# Patient Record
Sex: Female | Born: 1940
Health system: Southern US, Community
[De-identification: ages and names within clinical notes are randomized; demographics above are authoritative.]

## PROBLEM LIST (undated history)

## (undated) DIAGNOSIS — G4730 Sleep apnea, unspecified: Secondary | ICD-10-CM

## (undated) DIAGNOSIS — R413 Other amnesia: Secondary | ICD-10-CM

## (undated) DIAGNOSIS — E669 Obesity, unspecified: Secondary | ICD-10-CM

## (undated) DIAGNOSIS — G629 Polyneuropathy, unspecified: Secondary | ICD-10-CM

## (undated) DIAGNOSIS — M797 Fibromyalgia: Secondary | ICD-10-CM

## (undated) DIAGNOSIS — M1990 Unspecified osteoarthritis, unspecified site: Secondary | ICD-10-CM

## (undated) DIAGNOSIS — I10 Essential (primary) hypertension: Secondary | ICD-10-CM

## (undated) DIAGNOSIS — K219 Gastro-esophageal reflux disease without esophagitis: Secondary | ICD-10-CM

## (undated) DIAGNOSIS — A609 Anogenital herpesviral infection, unspecified: Secondary | ICD-10-CM

## (undated) DIAGNOSIS — K589 Irritable bowel syndrome without diarrhea: Secondary | ICD-10-CM

## (undated) DIAGNOSIS — N3281 Overactive bladder: Secondary | ICD-10-CM

## (undated) DIAGNOSIS — G2581 Restless legs syndrome: Secondary | ICD-10-CM

## (undated) DIAGNOSIS — F419 Anxiety disorder, unspecified: Secondary | ICD-10-CM

## (undated) HISTORY — PX: MULTIPLE TOOTH EXTRACTIONS: SHX2053

## (undated) HISTORY — DX: Sleep apnea, unspecified: G47.30

## (undated) HISTORY — PX: ROTATOR CUFF REPAIR: SHX139

## (undated) HISTORY — DX: Anxiety disorder, unspecified: F41.9

## (undated) HISTORY — PX: OTHER SURGICAL HISTORY: SHX169

## (undated) HISTORY — PX: CATARACT EXTRACTION: SUR2

## (undated) HISTORY — DX: Gastro-esophageal reflux disease without esophagitis: K21.9

## (undated) HISTORY — PX: CHOLECYSTECTOMY: SHX55

## (undated) HISTORY — DX: Fibromyalgia: M79.7

## (undated) HISTORY — PX: TOTAL KNEE ARTHROPLASTY: SHX125

## (undated) HISTORY — DX: Essential (primary) hypertension: I10

## (undated) HISTORY — DX: Obesity, unspecified: E66.9

## (undated) HISTORY — DX: Overactive bladder: N32.81

## (undated) HISTORY — PX: APPENDECTOMY: SHX54

## (undated) HISTORY — DX: Unspecified osteoarthritis, unspecified site: M19.90

## (undated) HISTORY — DX: Other amnesia: R41.3

## (undated) HISTORY — DX: Anogenital herpesviral infection, unspecified: A60.9

## (undated) HISTORY — DX: Irritable bowel syndrome, unspecified: K58.9

## (undated) HISTORY — DX: Polyneuropathy, unspecified: G62.9

## (undated) HISTORY — DX: Restless legs syndrome: G25.81

---

## 1997-07-03 ENCOUNTER — Emergency Department (HOSPITAL_COMMUNITY): Admission: EM | Admit: 1997-07-03 | Discharge: 1997-07-03 | Payer: Self-pay | Admitting: Emergency Medicine

## 1997-08-04 ENCOUNTER — Ambulatory Visit (HOSPITAL_COMMUNITY): Admission: RE | Admit: 1997-08-04 | Discharge: 1997-08-04 | Payer: Self-pay | Admitting: Gastroenterology

## 1998-06-23 ENCOUNTER — Other Ambulatory Visit: Admission: RE | Admit: 1998-06-23 | Discharge: 1998-06-23 | Payer: Self-pay | Admitting: Gynecology

## 1999-07-12 ENCOUNTER — Other Ambulatory Visit: Admission: RE | Admit: 1999-07-12 | Discharge: 1999-07-12 | Payer: Self-pay | Admitting: Gynecology

## 1999-09-22 ENCOUNTER — Encounter: Payer: Self-pay | Admitting: Orthopaedic Surgery

## 1999-09-22 ENCOUNTER — Encounter: Admission: RE | Admit: 1999-09-22 | Discharge: 1999-09-22 | Payer: Self-pay | Admitting: Orthopaedic Surgery

## 2000-06-07 ENCOUNTER — Encounter: Admission: RE | Admit: 2000-06-07 | Discharge: 2000-06-07 | Payer: Self-pay | Admitting: Family Medicine

## 2000-06-07 ENCOUNTER — Encounter: Payer: Self-pay | Admitting: Family Medicine

## 2000-08-01 ENCOUNTER — Other Ambulatory Visit: Admission: RE | Admit: 2000-08-01 | Discharge: 2000-08-01 | Payer: Self-pay | Admitting: Gynecology

## 2001-05-14 ENCOUNTER — Encounter: Admission: RE | Admit: 2001-05-14 | Discharge: 2001-05-14 | Payer: Self-pay | Admitting: Family Medicine

## 2001-05-14 ENCOUNTER — Encounter: Payer: Self-pay | Admitting: Family Medicine

## 2001-05-23 ENCOUNTER — Encounter: Payer: Self-pay | Admitting: Orthopedic Surgery

## 2001-05-23 ENCOUNTER — Encounter: Admission: RE | Admit: 2001-05-23 | Discharge: 2001-05-23 | Payer: Self-pay | Admitting: Orthopedic Surgery

## 2001-09-03 ENCOUNTER — Other Ambulatory Visit: Admission: RE | Admit: 2001-09-03 | Discharge: 2001-09-03 | Payer: Self-pay | Admitting: Gynecology

## 2002-09-10 ENCOUNTER — Other Ambulatory Visit: Admission: RE | Admit: 2002-09-10 | Discharge: 2002-09-10 | Payer: Self-pay | Admitting: Gynecology

## 2002-10-02 ENCOUNTER — Encounter: Payer: Self-pay | Admitting: Orthopaedic Surgery

## 2002-10-02 ENCOUNTER — Encounter: Admission: RE | Admit: 2002-10-02 | Discharge: 2002-10-02 | Payer: Self-pay | Admitting: Orthopaedic Surgery

## 2002-12-12 ENCOUNTER — Encounter: Admission: RE | Admit: 2002-12-12 | Discharge: 2002-12-12 | Payer: Self-pay | Admitting: Orthopaedic Surgery

## 2002-12-12 ENCOUNTER — Encounter: Payer: Self-pay | Admitting: Radiology

## 2002-12-12 ENCOUNTER — Encounter: Payer: Self-pay | Admitting: Orthopaedic Surgery

## 2002-12-30 ENCOUNTER — Encounter: Admission: RE | Admit: 2002-12-30 | Discharge: 2002-12-30 | Payer: Self-pay | Admitting: Orthopaedic Surgery

## 2003-01-13 ENCOUNTER — Encounter: Admission: RE | Admit: 2003-01-13 | Discharge: 2003-01-13 | Payer: Self-pay | Admitting: Orthopaedic Surgery

## 2003-09-18 ENCOUNTER — Other Ambulatory Visit: Admission: RE | Admit: 2003-09-18 | Discharge: 2003-09-18 | Payer: Self-pay | Admitting: Gynecology

## 2007-03-21 ENCOUNTER — Encounter: Admission: RE | Admit: 2007-03-21 | Discharge: 2007-03-21 | Payer: Self-pay | Admitting: Rheumatology

## 2008-02-05 ENCOUNTER — Encounter: Admission: RE | Admit: 2008-02-05 | Discharge: 2008-02-18 | Payer: Self-pay | Admitting: Neurology

## 2008-10-13 ENCOUNTER — Encounter: Admission: RE | Admit: 2008-10-13 | Discharge: 2008-10-13 | Payer: Self-pay | Admitting: Family Medicine

## 2008-12-15 ENCOUNTER — Ambulatory Visit (HOSPITAL_COMMUNITY): Admission: RE | Admit: 2008-12-15 | Discharge: 2008-12-15 | Payer: Self-pay | Admitting: Rheumatology

## 2009-03-24 ENCOUNTER — Inpatient Hospital Stay (HOSPITAL_COMMUNITY): Admission: EM | Admit: 2009-03-24 | Discharge: 2009-03-26 | Payer: Self-pay | Admitting: Emergency Medicine

## 2009-11-17 ENCOUNTER — Inpatient Hospital Stay (HOSPITAL_COMMUNITY)
Admission: RE | Admit: 2009-11-17 | Discharge: 2009-11-20 | Payer: Self-pay | Source: Home / Self Care | Admitting: Orthopaedic Surgery

## 2009-12-16 ENCOUNTER — Encounter: Admission: RE | Admit: 2009-12-16 | Discharge: 2009-12-16 | Payer: Self-pay | Admitting: Family Medicine

## 2010-05-13 LAB — BASIC METABOLIC PANEL
BUN: 6 mg/dL (ref 6–23)
BUN: 6 mg/dL (ref 6–23)
BUN: 8 mg/dL (ref 6–23)
CO2: 28 mEq/L (ref 19–32)
CO2: 31 mEq/L (ref 19–32)
CO2: 31 mEq/L (ref 19–32)
Calcium: 8.5 mg/dL (ref 8.4–10.5)
Calcium: 8.6 mg/dL (ref 8.4–10.5)
Calcium: 9.1 mg/dL (ref 8.4–10.5)
Chloride: 102 mEq/L (ref 96–112)
Chloride: 102 mEq/L (ref 96–112)
Chloride: 103 mEq/L (ref 96–112)
Creatinine, Ser: 0.7 mg/dL (ref 0.4–1.2)
Creatinine, Ser: 0.77 mg/dL (ref 0.4–1.2)
Creatinine, Ser: 0.8 mg/dL (ref 0.4–1.2)
GFR calc Af Amer: >60 mL/min (ref >60)
GFR calc Af Amer: >60 mL/min (ref >60)
GFR calc Af Amer: >60 mL/min (ref >60)
GFR calc non Af Amer: >60 mL/min (ref >60)
GFR calc non Af Amer: >60 mL/min (ref >60)
GFR calc non Af Amer: >60 mL/min (ref >60)
Glucose, Bld: 105 mg/dL — ABNORMAL HIGH (ref 70–99)
Glucose, Bld: 127 mg/dL — ABNORMAL HIGH (ref 70–99)
Glucose, Bld: 144 mg/dL — ABNORMAL HIGH (ref 70–99)
Potassium: 3.6 mEq/L (ref 3.5–5.1)
Potassium: 3.8 mEq/L (ref 3.5–5.1)
Potassium: 4.3 mEq/L (ref 3.5–5.1)
Sodium: 136 mEq/L (ref 135–145)
Sodium: 140 mEq/L (ref 135–145)
Sodium: 143 mEq/L (ref 135–145)

## 2010-05-13 LAB — CBC
HCT: 31.9 % — ABNORMAL LOW (ref 36.0–46.0)
HCT: 32 % — ABNORMAL LOW (ref 36.0–46.0)
HCT: 32.2 % — ABNORMAL LOW (ref 36.0–46.0)
HCT: 38.1 % (ref 36.0–46.0)
Hemoglobin: 10.4 g/dL — ABNORMAL LOW (ref 12.0–15.0)
Hemoglobin: 10.7 g/dL — ABNORMAL LOW (ref 12.0–15.0)
Hemoglobin: 10.8 g/dL — ABNORMAL LOW (ref 12.0–15.0)
Hemoglobin: 12.6 g/dL (ref 12.0–15.0)
MCH: 29.6 pg (ref 26.0–34.0)
MCH: 30.1 pg (ref 26.0–34.0)
MCH: 30.1 pg (ref 26.0–34.0)
MCH: 30.1 pg (ref 26.0–34.0)
MCHC: 32.5 g/dL (ref 30.0–36.0)
MCHC: 33.1 g/dL (ref 30.0–36.0)
MCHC: 33.5 g/dL (ref 30.0–36.0)
MCHC: 33.5 g/dL (ref 30.0–36.0)
MCV: 88.1 fL (ref 78.0–100.0)
MCV: 89.7 fL (ref 78.0–100.0)
MCV: 90.9 fL (ref 78.0–100.0)
MCV: 92.8 fL (ref 78.0–100.0)
Platelets: 123 10*3/uL — ABNORMAL LOW (ref 150–400)
Platelets: 152 10*3/uL (ref 150–400)
Platelets: 168 10*3/uL (ref 150–400)
Platelets: 207 10*3/uL (ref 150–400)
RBC: 3.45 MIL/uL — ABNORMAL LOW (ref 3.87–5.11)
RBC: 3.59 MIL/uL — ABNORMAL LOW (ref 3.87–5.11)
RBC: 3.62 MIL/uL — ABNORMAL LOW (ref 3.87–5.11)
RBC: 4.19 MIL/uL (ref 3.87–5.11)
RDW: 13.5 % (ref 11.5–15.5)
RDW: 13.6 % (ref 11.5–15.5)
RDW: 13.9 % (ref 11.5–15.5)
RDW: 14 % (ref 11.5–15.5)
WBC: 7.6 10*3/uL (ref 4.0–10.5)
WBC: 8.1 10*3/uL (ref 4.0–10.5)
WBC: 8.5 10*3/uL (ref 4.0–10.5)
WBC: 8.9 10*3/uL (ref 4.0–10.5)

## 2010-05-13 LAB — DIFFERENTIAL
Basophils Absolute: 0.1 10*3/uL (ref 0.0–0.1)
Basophils Relative: 1 % (ref 0–1)
Eosinophils Absolute: 0.2 10*3/uL (ref 0.0–0.7)
Eosinophils Relative: 3 % (ref 0–5)
Lymphocytes Relative: 27 % (ref 12–46)
Lymphs Abs: 2.3 10*3/uL (ref 0.7–4.0)
Monocytes Absolute: 1.1 10*3/uL — ABNORMAL HIGH (ref 0.1–1.0)
Monocytes Relative: 12 % (ref 3–12)
Neutro Abs: 4.9 10*3/uL (ref 1.7–7.7)
Neutrophils Relative %: 57 % (ref 43–77)

## 2010-05-13 LAB — ABO/RH: ABO/RH(D): O POS

## 2010-05-13 LAB — TYPE AND SCREEN
ABO/RH(D): O POS
Antibody Screen: NEGATIVE

## 2010-05-13 LAB — COMPREHENSIVE METABOLIC PANEL
ALT: 50 U/L — ABNORMAL HIGH (ref 0–35)
AST: 64 U/L — ABNORMAL HIGH (ref 0–37)
Albumin: 4.1 g/dL (ref 3.5–5.2)
Alkaline Phosphatase: 115 U/L (ref 39–117)
BUN: 11 mg/dL (ref 6–23)
CO2: 30 mEq/L (ref 19–32)
Calcium: 9.6 mg/dL (ref 8.4–10.5)
Chloride: 103 mEq/L (ref 96–112)
Creatinine, Ser: 0.86 mg/dL (ref 0.4–1.2)
GFR calc Af Amer: >60 mL/min (ref >60)
GFR calc non Af Amer: >60 mL/min (ref >60)
Glucose, Bld: 79 mg/dL (ref 70–99)
Potassium: 4.4 mEq/L (ref 3.5–5.1)
Sodium: 139 mEq/L (ref 135–145)
Total Bilirubin: 0.7 mg/dL (ref 0.3–1.2)
Total Protein: 7.2 g/dL (ref 6.0–8.3)

## 2010-05-13 LAB — URINALYSIS, ROUTINE W REFLEX MICROSCOPIC
Bilirubin Urine: NEGATIVE
Glucose, UA: NEGATIVE mg/dL
Hgb urine dipstick: NEGATIVE
Ketones, ur: NEGATIVE mg/dL
Nitrite: NEGATIVE
Protein, ur: NEGATIVE mg/dL
Specific Gravity, Urine: 1.008 (ref 1.005–1.030)
Urobilinogen, UA: 0.2 mg/dL (ref 0.0–1.0)
pH: 6.5 (ref 5.0–8.0)

## 2010-05-13 LAB — URINE CULTURE
Colony Count: NO GROWTH
Culture  Setup Time: 201109151220
Culture: NO GROWTH

## 2010-05-13 LAB — PROTIME-INR
INR: 0.97 (ref 0.00–1.49)
INR: 1.1 (ref 0.00–1.49)
Prothrombin Time: 13.1 seconds (ref 11.6–15.2)
Prothrombin Time: 14.4 seconds (ref 11.6–15.2)

## 2010-05-13 LAB — SURGICAL PCR SCREEN
MRSA, PCR: NEGATIVE
Staphylococcus aureus: NEGATIVE

## 2010-05-13 LAB — APTT: aPTT: 28 seconds (ref 24–37)

## 2010-05-17 LAB — CBC
HCT: 33.6 % — ABNORMAL LOW (ref 36.0–46.0)
HCT: 34.8 % — ABNORMAL LOW (ref 36.0–46.0)
HCT: 39.8 % (ref 36.0–46.0)
Hemoglobin: 11.2 g/dL — ABNORMAL LOW (ref 12.0–15.0)
Hemoglobin: 11.9 g/dL — ABNORMAL LOW (ref 12.0–15.0)
Hemoglobin: 13.8 g/dL (ref 12.0–15.0)
MCHC: 33.2 g/dL (ref 30.0–36.0)
MCHC: 34.1 g/dL (ref 30.0–36.0)
MCHC: 34.6 g/dL (ref 30.0–36.0)
MCV: 91.2 fL (ref 78.0–100.0)
MCV: 92 fL (ref 78.0–100.0)
MCV: 92.8 fL (ref 78.0–100.0)
Platelets: 170 10*3/uL (ref 150–400)
Platelets: 204 10*3/uL (ref 150–400)
Platelets: 243 10*3/uL (ref 150–400)
RBC: 3.62 MIL/uL — ABNORMAL LOW (ref 3.87–5.11)
RBC: 3.78 MIL/uL — ABNORMAL LOW (ref 3.87–5.11)
RBC: 4.37 MIL/uL (ref 3.87–5.11)
RDW: 14.2 % (ref 11.5–15.5)
RDW: 14.2 % (ref 11.5–15.5)
RDW: 14.4 % (ref 11.5–15.5)
WBC: 6.2 10*3/uL (ref 4.0–10.5)
WBC: 7.1 10*3/uL (ref 4.0–10.5)
WBC: 9 10*3/uL (ref 4.0–10.5)

## 2010-05-17 LAB — URINALYSIS, ROUTINE W REFLEX MICROSCOPIC
Bilirubin Urine: NEGATIVE
Glucose, UA: NEGATIVE mg/dL
Ketones, ur: 15 mg/dL — AB
Leukocytes, UA: NEGATIVE
Nitrite: NEGATIVE
Protein, ur: >300 mg/dL — AB
Specific Gravity, Urine: 1.012 (ref 1.005–1.030)
Urobilinogen, UA: 1 mg/dL (ref 0.0–1.0)
pH: 6 (ref 5.0–8.0)

## 2010-05-17 LAB — DIFFERENTIAL
Basophils Absolute: 0.1 10*3/uL (ref 0.0–0.1)
Basophils Absolute: 0.1 10*3/uL (ref 0.0–0.1)
Basophils Relative: 1 % (ref 0–1)
Basophils Relative: 1 % (ref 0–1)
Eosinophils Absolute: 0.2 10*3/uL (ref 0.0–0.7)
Eosinophils Absolute: 0.3 10*3/uL (ref 0.0–0.7)
Eosinophils Relative: 2 % (ref 0–5)
Eosinophils Relative: 4 % (ref 0–5)
Lymphocytes Relative: 19 % (ref 12–46)
Lymphocytes Relative: 23 % (ref 12–46)
Lymphs Abs: 1.4 10*3/uL (ref 0.7–4.0)
Lymphs Abs: 1.7 10*3/uL (ref 0.7–4.0)
Monocytes Absolute: 0.8 10*3/uL (ref 0.1–1.0)
Monocytes Absolute: 0.9 10*3/uL (ref 0.1–1.0)
Monocytes Relative: 10 % (ref 3–12)
Monocytes Relative: 13 % — ABNORMAL HIGH (ref 3–12)
Neutro Abs: 3.7 10*3/uL (ref 1.7–7.7)
Neutro Abs: 6.1 10*3/uL (ref 1.7–7.7)
Neutrophils Relative %: 59 % (ref 43–77)
Neutrophils Relative %: 67 % (ref 43–77)

## 2010-05-17 LAB — STOOL CULTURE

## 2010-05-17 LAB — COMPREHENSIVE METABOLIC PANEL
ALT: 47 U/L — ABNORMAL HIGH (ref 0–35)
AST: 63 U/L — ABNORMAL HIGH (ref 0–37)
Albumin: 2.9 g/dL — ABNORMAL LOW (ref 3.5–5.2)
Alkaline Phosphatase: 91 U/L (ref 39–117)
BUN: 7 mg/dL (ref 6–23)
CO2: 25 mEq/L (ref 19–32)
Calcium: 8.5 mg/dL (ref 8.4–10.5)
Chloride: 112 mEq/L (ref 96–112)
Creatinine, Ser: 0.61 mg/dL (ref 0.4–1.2)
GFR calc Af Amer: >60 mL/min (ref >60)
GFR calc non Af Amer: >60 mL/min (ref >60)
Glucose, Bld: 99 mg/dL (ref 70–99)
Potassium: 3.9 mEq/L (ref 3.5–5.1)
Sodium: 141 mEq/L (ref 135–145)
Total Bilirubin: 0.5 mg/dL (ref 0.3–1.2)
Total Protein: 6 g/dL (ref 6.0–8.3)

## 2010-05-17 LAB — URINE MICROSCOPIC-ADD ON

## 2010-05-17 LAB — POCT I-STAT, CHEM 8
BUN: 11 mg/dL (ref 6–23)
Calcium, Ion: 1.09 mmol/L — ABNORMAL LOW (ref 1.12–1.32)
Chloride: 107 mEq/L (ref 96–112)
Creatinine, Ser: 0.6 mg/dL (ref 0.4–1.2)
Glucose, Bld: 100 mg/dL — ABNORMAL HIGH (ref 70–99)
HCT: 41 % (ref 36.0–46.0)
Hemoglobin: 13.9 g/dL (ref 12.0–15.0)
Potassium: 3.3 mEq/L — ABNORMAL LOW (ref 3.5–5.1)
Sodium: 139 mEq/L (ref 135–145)
TCO2: 23 mmol/L (ref 0–100)

## 2010-05-17 LAB — GIARDIA/CRYPTOSPORIDIUM SCREEN(EIA)
Cryptosporidium Screen (EIA): NEGATIVE
Giardia Screen - EIA: NEGATIVE

## 2010-05-17 LAB — CK TOTAL AND CKMB (NOT AT ARMC)
CK, MB: 9.7 ng/mL (ref 0.3–4.0)
Relative Index: 2.3 (ref 0.0–2.5)
Total CK: 427 U/L — ABNORMAL HIGH (ref 7–177)

## 2010-05-17 LAB — HEPATIC FUNCTION PANEL
ALT: 57 U/L — ABNORMAL HIGH (ref 0–35)
AST: 94 U/L — ABNORMAL HIGH (ref 0–37)
Albumin: 3.4 g/dL — ABNORMAL LOW (ref 3.5–5.2)
Alkaline Phosphatase: 109 U/L (ref 39–117)
Bilirubin, Direct: 0.2 mg/dL (ref 0.0–0.3)
Indirect Bilirubin: 0.5 mg/dL (ref 0.3–0.9)
Total Bilirubin: 0.7 mg/dL (ref 0.3–1.2)
Total Protein: 7.1 g/dL (ref 6.0–8.3)

## 2010-05-17 LAB — MAGNESIUM: Magnesium: 1.9 mg/dL (ref 1.5–2.5)

## 2010-05-17 LAB — URINE CULTURE: Colony Count: 25000

## 2010-05-17 LAB — LACTIC ACID, PLASMA: Lactic Acid, Venous: 1.5 mmol/L (ref 0.5–2.2)

## 2010-05-17 LAB — BASIC METABOLIC PANEL
BUN: 6 mg/dL (ref 6–23)
CO2: 25 mEq/L (ref 19–32)
Calcium: 8.3 mg/dL — ABNORMAL LOW (ref 8.4–10.5)
Chloride: 110 mEq/L (ref 96–112)
Creatinine, Ser: 0.58 mg/dL (ref 0.4–1.2)
GFR calc Af Amer: >60 mL/min (ref >60)
GFR calc non Af Amer: >60 mL/min (ref >60)
Glucose, Bld: 98 mg/dL (ref 70–99)
Potassium: 3.7 mEq/L (ref 3.5–5.1)
Sodium: 140 mEq/L (ref 135–145)

## 2010-05-17 LAB — CLOSTRIDIUM DIFFICILE EIA: C difficile Toxins A+B, EIA: NEGATIVE

## 2010-05-17 LAB — LIPASE, BLOOD: Lipase: 27 U/L (ref 11–59)

## 2010-05-17 LAB — HEMOCCULT GUIAC POC 1CARD (OFFICE): Fecal Occult Bld: NEGATIVE

## 2010-05-17 LAB — FECAL LACTOFERRIN, QUANT: Fecal Lactoferrin: NEGATIVE

## 2010-05-17 LAB — TROPONIN I: Troponin I: 0.01 ng/mL (ref 0.00–0.06)

## 2010-05-17 LAB — POCT CARDIAC MARKERS
CKMB, poc: 8.7 ng/mL (ref 1.0–8.0)
Myoglobin, poc: >500 ng/mL (ref 12–200)
Troponin i, poc: <0.05 ng/mL (ref 0.00–0.09)

## 2010-05-17 LAB — PHOSPHORUS: Phosphorus: 3.1 mg/dL (ref 2.3–4.6)

## 2011-06-28 ENCOUNTER — Other Ambulatory Visit: Payer: Self-pay | Admitting: Neurology

## 2011-06-28 DIAGNOSIS — M797 Fibromyalgia: Secondary | ICD-10-CM

## 2011-07-02 ENCOUNTER — Other Ambulatory Visit: Payer: Self-pay

## 2011-07-08 ENCOUNTER — Ambulatory Visit
Admission: RE | Admit: 2011-07-08 | Discharge: 2011-07-08 | Disposition: A | Discharge status: Home / Self Care | Payer: Medicare Other | Source: Ambulatory Visit | Attending: Neurology | Admitting: Neurology

## 2011-07-08 DIAGNOSIS — M797 Fibromyalgia: Secondary | ICD-10-CM

## 2011-12-22 ENCOUNTER — Other Ambulatory Visit (HOSPITAL_COMMUNITY)
Admission: RE | Admit: 2011-12-22 | Discharge: 2011-12-22 | Disposition: A | Discharge status: Home / Self Care | Payer: Medicare Other | Source: Ambulatory Visit | Attending: Obstetrics and Gynecology | Admitting: Obstetrics and Gynecology

## 2011-12-22 ENCOUNTER — Other Ambulatory Visit: Payer: Self-pay | Admitting: Obstetrics and Gynecology

## 2011-12-22 DIAGNOSIS — Z124 Encounter for screening for malignant neoplasm of cervix: Secondary | ICD-10-CM | POA: Insufficient documentation

## 2011-12-22 DIAGNOSIS — Z1151 Encounter for screening for human papillomavirus (HPV): Secondary | ICD-10-CM | POA: Insufficient documentation

## 2011-12-22 DIAGNOSIS — N644 Mastodynia: Secondary | ICD-10-CM

## 2011-12-22 DIAGNOSIS — R87810 Cervical high risk human papillomavirus (HPV) DNA test positive: Secondary | ICD-10-CM | POA: Insufficient documentation

## 2012-01-03 ENCOUNTER — Ambulatory Visit
Admission: RE | Admit: 2012-01-03 | Discharge: 2012-01-03 | Disposition: A | Discharge status: Home / Self Care | Payer: Medicare Other | Source: Ambulatory Visit | Attending: Obstetrics and Gynecology | Admitting: Obstetrics and Gynecology

## 2012-01-03 DIAGNOSIS — N644 Mastodynia: Secondary | ICD-10-CM

## 2012-08-01 ENCOUNTER — Encounter: Payer: Self-pay | Admitting: Nurse Practitioner

## 2012-08-02 ENCOUNTER — Encounter: Payer: Self-pay | Admitting: Nurse Practitioner

## 2012-08-02 ENCOUNTER — Ambulatory Visit (INDEPENDENT_AMBULATORY_CARE_PROVIDER_SITE_OTHER): Payer: Medicare Other | Admitting: Nurse Practitioner

## 2012-08-02 VITALS — BP 113/69 | HR 96 | Ht 65.5 in | Wt 153.0 lb

## 2012-08-02 DIAGNOSIS — R413 Other amnesia: Secondary | ICD-10-CM

## 2012-08-02 DIAGNOSIS — IMO0001 Reserved for inherently not codable concepts without codable children: Secondary | ICD-10-CM

## 2012-08-02 NOTE — Progress Notes (Signed)
HPI: Patient returns for followup after last visit with Dr. Terrace Arabia 06/21/2011. She has a history of fibromyalgia hypertension anxiety obstructive sleep apnea using CPAP and short-term memory trouble. She also has a history of bilateral feet paresthesias. MRI of the brain 07/11/2011 shows mild changes of chronic microvascular ischemia and generalized cerebral atrophy. Dementia labs  returned normal . The patient continues to drive and has gotten lost in unfamiliar surroundings. She continues to be independent with her activities of daily living. She plays golf. She has not had any falls, long-standing history of frequency incontinence and on oxybutynin. Her Mini-Mental Status exam is stable ROS:  Fatigue, depression anxiety, increased thirst, joint pains, mild memory loss  Physical Exam General: well developed, well nourished, seated, in no evident distress Head: head normocephalic and atraumatic. Oropharynx benign Neck: supple with no carotid  bruits Cardiovascular: regular rate and rhythm, no murmurs  Neurologic Exam Mental Status: Awake and fully alert. Oriented to place and time. Follows all commands. Speech and language normal. MMSE 30/30.   Cranial Nerves:  Pupils equal, briskly reactive to light. Extraocular movements full without nystagmus. Visual fields full to confrontation. Hearing intact and symmetric to finger snap. Facial sensation intact. Face, tongue, palate move normally and symmetrically. Neck flexion and extension normal.  Motor: Normal bulk and tone. Normal strength in all tested extremity muscles.No focal weakness Sensory.: intact to touch and pinprick and vibratory.  Coordination: Rapid alternating movements normal in all extremities. Finger-to-nose and heel-to-shin performed accurately bilaterally. Gait and Station: Arises from chair without difficulty. Stance is normal. Gait demonstrates normal stride length and balance . Able to heel, toe and mild difficulty with tandem walk.  Romberg negative  Reflexes: 2+ and symmetric except Achilles 1+. Toes downgoing.     ASSESSMENT: Mild cognitive loss but stable MMSE 30 out of 30. MRI of the brain 07/11/2011 with mild changes of chronic microvascular ischemia and generalized cerebral atrophy Labs for dementia returned normal     PLAN: Follow up yearly to evaluate memory status Fall risk=9, be careful with ambulation  Nilda Riggs, GNP-BC APRN

## 2012-08-02 NOTE — Patient Instructions (Addendum)
Pt to continue current meds. Fu yearly for MMSE score today 30/30

## 2013-01-15 ENCOUNTER — Encounter: Payer: Self-pay | Admitting: Cardiology

## 2013-01-17 ENCOUNTER — Telehealth: Payer: Self-pay | Admitting: Cardiology

## 2013-01-17 NOTE — Telephone Encounter (Signed)
Spoke to pt

## 2013-01-17 NOTE — Telephone Encounter (Signed)
New Problem:  Pt states she is returning Danielle's call. Please advise

## 2013-04-19 ENCOUNTER — Encounter: Payer: Self-pay | Admitting: General Surgery

## 2013-04-19 DIAGNOSIS — I10 Essential (primary) hypertension: Secondary | ICD-10-CM

## 2013-04-19 DIAGNOSIS — G4733 Obstructive sleep apnea (adult) (pediatric): Secondary | ICD-10-CM | POA: Insufficient documentation

## 2013-04-22 ENCOUNTER — Ambulatory Visit (INDEPENDENT_AMBULATORY_CARE_PROVIDER_SITE_OTHER): Payer: Medicare Other | Admitting: Cardiology

## 2013-04-22 ENCOUNTER — Encounter: Payer: Self-pay | Admitting: Cardiology

## 2013-04-22 VITALS — BP 124/76 | HR 72 | Ht 65.5 in | Wt 158.0 lb

## 2013-04-22 DIAGNOSIS — G4733 Obstructive sleep apnea (adult) (pediatric): Secondary | ICD-10-CM

## 2013-04-22 DIAGNOSIS — I10 Essential (primary) hypertension: Secondary | ICD-10-CM

## 2013-04-22 NOTE — Progress Notes (Signed)
[address] [place], [place]  [address] Phone: [phone] Fax:  [phone]  Date:  04/22/2013   ID:  Lynn Price, DOB 1940-09-02, MRN [id number]  PCP:  Thora Lance, MD  Sleep Medicine:  Armanda Magic, MD     History of Present Illness: Lynn Price is a 73 y.o. female with a history of OSA and HTN who presents today for followup.  She is doing well with her device.  She tolerates her CPAP machine but says that she cannot tolerate using it every night..  She tolerates her nasal pillow mask and feels the pressure is adequate.  She feels rested in the am and has no daytime sleepiness unless she has not used her device the night before.  She does not get any aerobic exercise.  Wt Readings from Last 3 Encounters:  04/22/13 158 lb (71.668 kg)  08/02/12 153 lb (69.4 kg)  06/21/11 160 lb (72.576 kg)     Past Medical History  Diagnosis Date  . High blood pressure   . Neuropathy   . Anxiety   . Fibromyalgia   . GERD (gastroesophageal reflux disease)   . IBS (irritable bowel syndrome)   . HSV (herpes simplex virus) anogenital infection   . Restless leg syndrome   . Sleep apnea   . Memory loss   . Osteoarthritis   . Overactive bladder     Current Outpatient Prescriptions  Medication Sig Dispense Refill  . diltiazem (CARTIA XT) 180 MG 24 hr capsule Take 180 mg by mouth daily.      Marland Kitchen estradiol (ESTRACE) 0.1 MG/GM vaginal cream Place 1 Applicatorful vaginally as needed.      . Folic Acid-Vit B6-Vit B12 (FABB) 2.2-25-1 MG TABS Take 1 tablet by mouth 2 (two) times daily.      Marland Kitchen gabapentin (NEURONTIN) 300 MG capsule Take 300 mg by mouth. 2 tab in am 3 tab in pm      . LORazepam (ATIVAN) 0.5 MG tablet 0.5 mg 2 (two) times daily. As needed for anxiety      . Omeprazole (HM OMEPRAZOLE) 20 MG TBEC Take 20 mg by mouth 2 (two) times daily.       Marland Kitchen oxybutynin (DITROPAN) 5 MG tablet Take 5 mg by mouth 2 (two) times daily.       Marland Kitchen triamcinolone (KENALOG) 0.025 % cream  Apply topically 2 (two) times daily.      Marland Kitchen venlafaxine XR (EFFEXOR XR) 150 MG 24 hr capsule Take 150 mg by mouth daily.       No current facility-administered medications for this visit.    Allergies:    Allergies  Allergen Reactions  . Erythromycin Rash  . Penicillins Rash  . Tetracyclines & Related Rash    Social History:  The patient  reports that she has never smoked. She has never used smokeless tobacco. She reports that she drinks alcohol. She reports that she does not use illicit drugs.   Family History:  The patient's family history includes Depression in her father and mother.   ROS:  Please see the history of present illness.      All other systems reviewed and negative.   PHYSICAL EXAM: VS:  BP 124/76  Pulse 72  Ht 5' 5.5" (1.664 m)  Wt 158 lb (71.668 kg)  BMI 25.88 kg/m2 Well nourished, well developed, in no acute distress HEENT: normal Neck: no JVD Cardiac:  normal S1, S2; RRR; no murmur Lungs:  clear to  auscultation bilaterally, no wheezing, rhonchi or rales Abd: soft, nontender, no hepatomegaly Ext: no edema Skin: warm and dry Neuro:  CNs 2-12 intact, no focal abnormalities noted       ASSESSMENT AND PLAN:  1. OSA on CPAP  - I will get a download from her DME 2. HTN - well controlled  Followup with me in 6 months  Signed, Armanda Magicraci Eureka Valdes, MD 04/22/2013 11:00 AM

## 2013-04-22 NOTE — Patient Instructions (Signed)
Your physician recommends that you continue on your current medications as directed. Please refer to the Current Medication list given to you today.  Bring your unit over to [facility] for a download  Your physician wants you to follow-up in: 6 months with Dr Mayford Knifeurner. You will receive a reminder letter in the mail two months in advance. If you don't receive a letter, please call our office to schedule the follow-up appointment.

## 2013-05-17 ENCOUNTER — Encounter: Payer: Self-pay | Admitting: Cardiology

## 2013-05-20 ENCOUNTER — Other Ambulatory Visit: Payer: Self-pay | Admitting: General Surgery

## 2013-05-20 DIAGNOSIS — G4733 Obstructive sleep apnea (adult) (pediatric): Secondary | ICD-10-CM

## 2013-07-17 ENCOUNTER — Other Ambulatory Visit (HOSPITAL_COMMUNITY)
Admission: RE | Admit: 2013-07-17 | Discharge: 2013-07-17 | Disposition: A | Discharge status: Home / Self Care | Payer: Medicare Other | Source: Ambulatory Visit | Attending: Obstetrics and Gynecology | Admitting: Obstetrics and Gynecology

## 2013-07-17 ENCOUNTER — Other Ambulatory Visit: Payer: Self-pay | Admitting: Obstetrics and Gynecology

## 2013-07-17 DIAGNOSIS — Z124 Encounter for screening for malignant neoplasm of cervix: Secondary | ICD-10-CM | POA: Insufficient documentation

## 2013-07-17 DIAGNOSIS — Z1151 Encounter for screening for human papillomavirus (HPV): Secondary | ICD-10-CM | POA: Insufficient documentation

## 2013-07-23 ENCOUNTER — Other Ambulatory Visit: Payer: Self-pay

## 2013-07-23 DIAGNOSIS — Z1231 Encounter for screening mammogram for malignant neoplasm of breast: Secondary | ICD-10-CM

## 2013-08-01 ENCOUNTER — Encounter: Payer: Self-pay | Admitting: Nurse Practitioner

## 2013-08-01 ENCOUNTER — Encounter (INDEPENDENT_AMBULATORY_CARE_PROVIDER_SITE_OTHER): Payer: Self-pay

## 2013-08-01 ENCOUNTER — Ambulatory Visit (INDEPENDENT_AMBULATORY_CARE_PROVIDER_SITE_OTHER): Payer: Medicare Other | Admitting: Nurse Practitioner

## 2013-08-01 ENCOUNTER — Ambulatory Visit: Payer: Medicare Other | Admitting: Nurse Practitioner

## 2013-08-01 VITALS — BP 131/81 | HR 79 | Ht 65.0 in | Wt 160.0 lb

## 2013-08-01 DIAGNOSIS — R413 Other amnesia: Secondary | ICD-10-CM

## 2013-08-01 DIAGNOSIS — IMO0001 Reserved for inherently not codable concepts without codable children: Secondary | ICD-10-CM

## 2013-08-01 NOTE — Progress Notes (Signed)
[facility]  [name] DOB: 1940/05/28   REASON FOR VISIT: for memory loss   HISTORY OF PRESENT ILLNESS: Lynn Price, 73 year old female returns for followup. She was last seen 08/02/2012. She has a history of fibromyalgia hypertension, anxiety obstructive sleep apnea using CPAP and short-term memory trouble. She also has a history of bilateral feet paresthesias. MRI of the brain 07/11/2011 shows mild changes of chronic microvascular ischemia and generalized cerebral atrophy. Dementia labs returned normal . The patient continues to drive and has gotten lost in unfamiliar surroundings. She continues to be independent with her activities of daily living. She plays golf intermittently otherwise she gets no regular exercise. She has not had any falls, long-standing history of frequency incontinence and on oxybutynin. Her Mini-Mental Status exam is stable. She returns for reevaluation  REVIEW OF SYSTEMS: Full 14 system review of systems performed and notable only for those listed, all others are neg:  Constitutional: fatigue Cardiovascular: N/A  Ear/Nose/Throat: N/A  Skin: N/A  Eyes: Allergies Respiratory: Cough Gastroitestinal: Constipation, urinary frequency Hematology/Lymphatic: N/A  Endocrine: N/A Musculoskeletal: Joint pain, muscle Allergy/Immunology: N/A  Neurological: Memory loss Psychiatric: Anxiety Sleep : Obstructive sleep apnea   ALLERGIES: Allergies  Allergen Reactions  . Erythromycin Rash  . Penicillins Rash  . Tetracyclines & Related Rash    HOME MEDICATIONS: Outpatient Prescriptions Prior to Visit  Medication Sig Dispense Refill  . diltiazem (CARTIA XT) 180 MG 24 hr capsule Take 180 mg by mouth daily.      Marland Kitchen estradiol (ESTRACE) 0.1 MG/GM vaginal cream Place 1 Applicatorful vaginally as needed.      . gabapentin (NEURONTIN) 300 MG capsule Take 300 mg by mouth. 2 tab in am 3 tab in pm      . LORazepam (ATIVAN) 0.5 MG tablet 0.5 mg  2 (two) times daily. As needed for anxiety      . Omeprazole (HM OMEPRAZOLE) 20 MG TBEC Take 20 mg by mouth 2 (two) times daily.       Marland Kitchen oxybutynin (DITROPAN) 5 MG tablet Take 5 mg by mouth 2 (two) times daily.       Marland Kitchen triamcinolone (KENALOG) 0.025 % cream Apply topically 2 (two) times daily.      Marland Kitchen venlafaxine XR (EFFEXOR XR) 150 MG 24 hr capsule Take 150 mg by mouth daily.      . Folic Acid-Vit B6-Vit B12 (FABB) 2.2-25-1 MG TABS Take 1 tablet by mouth 2 (two) times daily.       No facility-administered medications prior to visit.    PAST MEDICAL HISTORY: Past Medical History  Diagnosis Date  . High blood pressure   . Neuropathy   . Anxiety   . Fibromyalgia   . GERD (gastroesophageal reflux disease)   . IBS (irritable bowel syndrome)   . HSV (herpes simplex virus) anogenital infection   . Restless leg syndrome   . Sleep apnea   . Memory loss   . Osteoarthritis   . Overactive bladder     PAST SURGICAL HISTORY: Past Surgical History  Procedure Laterality Date  . Appendectomy    . Cesarean section    . Cholecystectomy    . Rotator cuff repair Left   . Cataract extraction Left   . Colonscopy      FAMILY HISTORY: Family History  Problem Relation Age of Onset  . Depression Mother   . Depression Father     SOCIAL HISTORY: History   Social History  . Marital Status: Married  Spouse Name: Lynn Price    Number of Children: 2  . Years of Education: college   Occupational History  .      retired   Social History Main Topics  . Smoking status: Never Smoker   . Smokeless tobacco: Never Used  . Alcohol Use: 0.0 oz/week     Comment: wine occas  . Drug Use: No  . Sexual Activity: Not on file   Other Topics Concern  . Not on file   Social History Narrative   Patient lives at home with her husband Lynn Aguas(Lynn Price) and she is retired. Patient has 2 years of college. Patient has 2 children.      PHYSICAL EXAM  Filed Vitals:   08/01/13 1039  BP: 131/81  Pulse: 79    Height: 5\' 5"  (1.651 m)  Weight: 160 lb (72.576 kg)   Body mass index is 26.63 kg/(m^2).  Generalized: Well developed, in no acute distress  Head: normocephalic and atraumatic,. Oropharynx benign  Neck: Supple, no carotid bruits  Cardiac: Regular rate rhythm, no murmur  Musculoskeletal: No deformity   Neurological examination   Mentation: Alert oriented to time, place, history taking. MMSE 30/30/ AFT 9. Follows all commands speech and language fluent  Cranial nerve II-XII: Pupils were equal round reactive to light extraocular movements were full, visual field were full on confrontational test. Facial sensation and strength were normal. hearing was intact to finger rubbing bilaterally. Uvula tongue midline. head turning and shoulder shrug were normal and symmetric.Tongue protrusion into cheek strength was normal. Motor: normal bulk and tone, full strength in the BUE, BLE,  No focal weakness Sensory: normal and symmetric to light touch, pinprick, and  vibration  Coordination: finger-nose-finger, heel-to-shin bilaterally, no dysmetria Reflexes: Brachioradialis 2/2, biceps 2/2, triceps 2/2, patellar 2/2, Achilles 1/1, plantar responses were flexor bilaterally. Gait and Station: Rising up from seated position without assistance, normal stance,  moderate stride, good arm swing, smooth turning, able to perform tiptoe, and heel walking without difficulty. Tandem gait is mildly unsteady. Romberg negative  DIAGNOSTIC DATA (LABS, IMAGING, TESTING) -ASSESSMENT AND PLAN  73 y.o. year old female  has a past medical history of  Neuropathy; Anxiety; Fibromyalgia;  IBS (irritable bowel syndrome); Restless leg syndrome; Sleep apnea; Memory loss; Osteoarthritis; and Overactive bladder. here to followup. Her mild cognitive loss is stable  Given patient education on fibromayagia and reviewed the importance of exercise Needs daily exercise for overall general health and well-being Followup yearly Next  visit with Dr. Carmelina NounYan Draysen Weygandt Carolyn Mercadez Heitman, [facility] NetworkGNP, [facility]BC, APRN  [facility] [address] [place], [place] [address] [phone]

## 2013-08-01 NOTE — Patient Instructions (Signed)
Memory score is stable Daily exercise for overall general health and well-being Followup yearly Next visit with Dr. Terrace Arabia

## 2013-08-05 ENCOUNTER — Ambulatory Visit
Admission: RE | Admit: 2013-08-05 | Discharge: 2013-08-05 | Disposition: A | Discharge status: Home / Self Care | Payer: Medicare Other | Source: Ambulatory Visit

## 2013-08-05 DIAGNOSIS — Z1231 Encounter for screening mammogram for malignant neoplasm of breast: Secondary | ICD-10-CM

## 2013-08-21 ENCOUNTER — Encounter: Payer: Self-pay | Admitting: *Deleted

## 2013-09-25 ENCOUNTER — Other Ambulatory Visit: Payer: Self-pay | Admitting: Cardiology

## 2013-12-28 ENCOUNTER — Other Ambulatory Visit: Payer: Self-pay | Admitting: Cardiology

## 2014-01-15 ENCOUNTER — Encounter: Payer: Self-pay | Admitting: Neurology

## 2014-01-21 ENCOUNTER — Encounter: Payer: Self-pay | Admitting: Neurology

## 2014-04-01 ENCOUNTER — Ambulatory Visit (INDEPENDENT_AMBULATORY_CARE_PROVIDER_SITE_OTHER): Payer: Medicare Other | Admitting: Cardiology

## 2014-04-01 ENCOUNTER — Encounter: Payer: Self-pay | Admitting: Cardiology

## 2014-04-01 VITALS — BP 110/70 | HR 72 | Ht 60.0 in | Wt 153.8 lb

## 2014-04-01 DIAGNOSIS — I10 Essential (primary) hypertension: Secondary | ICD-10-CM

## 2014-04-01 DIAGNOSIS — E669 Obesity, unspecified: Secondary | ICD-10-CM

## 2014-04-01 DIAGNOSIS — G4733 Obstructive sleep apnea (adult) (pediatric): Secondary | ICD-10-CM

## 2014-04-01 HISTORY — DX: Obesity, unspecified: E66.9

## 2014-04-01 NOTE — Progress Notes (Signed)
Cardiology Office Note   Date:  04/01/2014   ID:  Lynn Fosterlaine C Pho, DOB 01-May-1940, MRN [id number]  PCP:  Thora LanceEHINGER,ROBERT R, MD  Cardiologist:   Quintella ReichertURNER,Camry Robello R, MD   Chief Complaint  Patient presents with  . Sleep Apnea  . Hypertension  . Obesity      History of Present Illness: Lynn Price is a 74 y.o. female with a history of OSA and HTN who presents today for followup. She is not doing well with her device. She stopped using her CPAP device and does not want to use it.  She says it is hard to clean and irritates her at night.  She says that it is very old. She says that she would be willing to try another sleep study to see if she still has OSA and if she does she would like a new machine.  She says that she sleeps well at night but is sleepy during the day and frequently naps in the afternoon.  Her husband says that she does not snore like she used to.    Past Medical History  Diagnosis Date  . High blood pressure   . Neuropathy   . Anxiety   . Fibromyalgia   . GERD (gastroesophageal reflux disease)   . IBS (irritable bowel syndrome)   . HSV (herpes simplex virus) anogenital infection   . Restless leg syndrome   . Sleep apnea   . Memory loss   . Osteoarthritis   . Overactive bladder   . Obesity (BMI 30-39.9) 04/01/2014    Past Surgical History  Procedure Laterality Date  . Appendectomy    . Cesarean section    . Cholecystectomy    . Rotator cuff repair Left   . Cataract extraction Left   . Colonscopy       Current Outpatient Prescriptions  Medication Sig Dispense Refill  . clobetasol (TEMOVATE) 0.05 % external solution     . diltiazem (CARDIZEM CD) 180 MG 24 hr capsule Take 1 capsule by mouth  daily for blood pressure 90 capsule 0  . econazole nitrate 1 % cream     . estradiol (ESTRACE) 0.1 MG/GM vaginal cream Place 1 Applicatorful vaginally as needed.    . gabapentin (NEURONTIN) 300 MG capsule Take 300 mg by mouth. 2 tab in am 3 tab in pm    .  LORazepam (ATIVAN) 0.5 MG tablet 0.5 mg 2 (two) times daily. As needed for anxiety    . Omeprazole (HM OMEPRAZOLE) 20 MG TBEC Take 20 mg by mouth 2 (two) times daily.     Marland Kitchen. oxybutynin (DITROPAN) 5 MG tablet Take 5 mg by mouth 2 (two) times daily.     Marland Kitchen. triamcinolone (KENALOG) 0.025 % cream Apply topically 2 (two) times daily.    . valACYclovir (VALTREX) 500 MG tablet     . venlafaxine XR (EFFEXOR XR) 150 MG 24 hr capsule Take 150 mg by mouth daily.     No current facility-administered medications for this visit.    Allergies:   Erythromycin; Penicillins; and Tetracyclines & related    Social History:  The patient  reports that she has never smoked. She has never used smokeless tobacco. She reports that she drinks alcohol. She reports that she does not use illicit drugs.   Family History:  The patient's family history includes Depression in her father and mother.    ROS:  Please see the history of present illness.   Otherwise, review of systems are positive  for none.   All other systems are reviewed and negative.    PHYSICAL EXAM: VS:  BP 110/70 mmHg  Pulse 72  Ht 5' (1.524 m)  Wt 153 lb 12.8 oz (69.763 kg)  BMI 30.04 kg/m2  SpO2 97% , BMI Body mass index is 30.04 kg/(m^2). GEN: Well nourished, well developed, in no acute distress HEENT: normal Neck: no JVD, carotid bruits, or masses Cardiac: RRR; no murmurs, rubs, or gallops,no edema  Respiratory:  clear to auscultation bilaterally, normal work of breathing GI: soft, nontender, nondistended, + BS MS: no deformity or atrophy Skin: warm and dry, no rash Neuro:  Strength and sensation are intact Psych: euthymic mood, full affect   EKG:  EKG is not ordered today.    Recent Labs: No results found for requested labs within last 365 days.    Lipid Panel No results found for: CHOL, TRIG, HDL, CHOLHDL, VLDL, LDLCALC, LDLDIRECT    Wt Readings from Last 3 Encounters:  04/01/14 153 lb 12.8 oz (69.763 kg)  08/01/13 160 lb  (72.576 kg)  04/22/13 158 lb (71.668 kg)    ASSESSMENT AND PLAN:  1. OSA on CPAP but stopped using it about 5 months ago because she didn't like it and it was hard to clean.  She is agreeable to repeating the sleep study and if she has significant OSA agrees to a new CPAP machine.  If OSA is only mild then we can pursue an oral device.   2. HTN - well controlled.  Continue Diltiazem 3. Obesity - she does not get much exercise.  I have encouraged her to try to get into a walking routine  Current medicines are reviewed at length with the patient today.  The patient does not have concerns regarding medicines.  The following changes have been made:  no change   Disposition:   FU with me after sleep study  Harlon Flor, MD  04/01/2014 11:36 AM    [facility] [address] [place], [place], [place]  [address] Phone: [phone]; Fax: [phone]

## 2014-04-01 NOTE — Patient Instructions (Signed)
Your physician recommends that you continue on your current medications as directed. Please refer to the Current Medication list given to you today.    Your physician has recommended that you have a sleep study. This test records several body functions during sleep, including: brain activity, eye movement, oxygen and carbon dioxide blood levels, heart rate and rhythm, breathing rate and rhythm, the flow of air through your mouth and nose, snoring, body muscle movements, and chest and belly movement.    FOLLOW AS NEEDED WITH DR TURNER AFTER SLEEP STUDY RESULTS

## 2014-04-07 ENCOUNTER — Other Ambulatory Visit: Payer: Self-pay | Admitting: Cardiology

## 2014-06-09 ENCOUNTER — Encounter (HOSPITAL_BASED_OUTPATIENT_CLINIC_OR_DEPARTMENT_OTHER): Payer: Medicare Other

## 2014-06-10 ENCOUNTER — Ambulatory Visit (HOSPITAL_BASED_OUTPATIENT_CLINIC_OR_DEPARTMENT_OTHER): Payer: Medicare Other | Attending: Cardiology

## 2014-06-10 VITALS — Ht 60.0 in | Wt 153.0 lb

## 2014-06-10 DIAGNOSIS — R0683 Snoring: Secondary | ICD-10-CM | POA: Diagnosis not present

## 2014-06-10 DIAGNOSIS — G4733 Obstructive sleep apnea (adult) (pediatric): Secondary | ICD-10-CM

## 2014-06-12 ENCOUNTER — Telehealth: Payer: Self-pay | Admitting: Cardiology

## 2014-06-12 NOTE — Addendum Note (Signed)
Addended by: Quintella ReichertURNER, Nas Wafer R on: 06/12/2014 09:17 PM   Modules accepted: Orders

## 2014-06-12 NOTE — Telephone Encounter (Signed)
Pt had successful PAP titration. Please setup appointment in 10 weeks. Please let [facility] know that order for PAP is in EPIC.   

## 2014-06-12 NOTE — Sleep Study (Addendum)
   NAME: Lynn Price DATE OF BIRTH:  [birth year] MEDICAL RECORLowanda FosterD NUMBER [id number]  LOCATION: [facility]  PHYSICIAN: Clever Geraldo R  DATE OF STUDY: 06/10/2014  SLEEP STUDY TYPE: Split night Sleep study with CPAP titration               REFERRING PHYSICIAN: Quintella Reicherturner, Hira Trent R, MD  INDICATION FOR STUDY: Obstructive sleep apnea  EPWORTH SLEEPINESS SCORE: 16 HEIGHT: 5' (152.4 cm)  WEIGHT: 153 lb (69.4 kg)    Body mass index is 29.88 kg/(m^2).  NECK SIZE: 14 in.  MEDICATIONS: Reviewed in the chart  SLEEP ARCHITECTURE: During the diagnostic portion of the test, the patient slept for a total of 288 minutes out of a total sleep period of 330 minutes.  There was no REM sleep and only 0.5 minutes of slow wave sleep.  The onset to sleep latency was prolonged at 23 minutes and the sleep efficiency was slightly reduced at 81%.    RESPIRATORY DATA: The patient was started on CPAP at 4cm H2O and titrated for respiratory events and snoring to 5cm H2O.  The patient was able to sleep for a prolonged period of time at 5cm H2O without further respiratory events but was unable to achieve REM supine sleep.  The AHI was 0.8 events per hour.  There was minimal snoring noted.    OXYGEN DATA: The average oxygen saturation was 90% and the minimum oxygen saturation was 83%.  The time spent with oxygen saturations below 88% was 1.8 minutes.  CARDIAC DATA: The patient maintained NSR throughout the study. The average heart rate was 79 bpm and the minimum heart rate was 32 bpm.    MOVEMENT/PARASOMNIA: There were an increased frequency of periodic limb movement disorders with a PLMS index of 28 movements per hour.  There were no REM sleep behavior disorders.  IMPRESSION/ RECOMMENDATION:   1.  Successful CPAP titration to 5cm H2O although the patient did not reach REM supine sleep at optimum pressure. 2.  Minimal snoring was noted. 3.  Reduced sleep efficiency with increased frequency of  awakenings. 4.  Abnormal sleep architecture with no REM sleep and very little slow wave sleep. 5.  No significant oxygen desaturations below 88% for a prolonged period of time. 6.  The patient should be counseled in good sleep hygiene.   7.  The patient should be counseled to avoid sleeping in the supine position. 8.  Recommend ResMed CPAP with heated humidifier at 5cm H2O, small/medium Respironics Wisp Nasal cushion mask.  Signed:   Quintella ReichertURNER,Kyler Lerette R Diplomate, American Board of Sleep Medicine  ELECTRONICALLY SIGNED ON:  06/12/2014, 9:05 PM [facility] PH: [phone]   FX: [phone] ACCREDITED BY THE AMERICAN ACADEMY OF SLEEP MEDICINE

## 2014-08-04 ENCOUNTER — Encounter: Payer: Self-pay | Admitting: Neurology

## 2014-08-04 ENCOUNTER — Ambulatory Visit (INDEPENDENT_AMBULATORY_CARE_PROVIDER_SITE_OTHER): Payer: Medicare Other | Admitting: Neurology

## 2014-08-04 VITALS — BP 115/65 | HR 97 | Ht 60.0 in | Wt 153.0 lb

## 2014-08-04 DIAGNOSIS — R413 Other amnesia: Secondary | ICD-10-CM

## 2014-08-04 DIAGNOSIS — R202 Paresthesia of skin: Secondary | ICD-10-CM

## 2014-08-04 DIAGNOSIS — G4733 Obstructive sleep apnea (adult) (pediatric): Secondary | ICD-10-CM | POA: Diagnosis not present

## 2014-08-04 MED ORDER — DONEPEZIL HCL 10 MG PO TABS: 10.0000 mg | ORAL_TABLET | Freq: Every day | ORAL | Status: DC

## 2014-08-04 NOTE — Progress Notes (Signed)
Chief Complaint  Patient presents with  . Memory Loss    MMSE 28/30 - 6 animals. She is here with her husband today.  They both feel her memory has worsened over the last year.     [facility]  [name] DOB: 11-10-1940   HISTORY OF PRESENT ILLNESS: Lynn Price, 74 year old female was referred by her primary care physician Dr. Blair Heysobert Price for evaluation of memory trouble. She was last seen in June 2015 by Lynn Price for memory loss  She has a history of fibromyalgia hypertension, anxiety, obstructive sleep apnea using CPAP and short-term memory trouble. She also has a history of bilateral feet paresthesias.  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     MRI of the brain 07/11/2011 shows mild changes of chronic microvascular ischemia and generalized cerebral atrophy.    The patient continues to drive and has gotten lost in unfamiliar surroundings. She continues to be independent with her activities of daily living. She plays golf intermittently otherwise she gets no regular exercise. She has not had any falls, long-standing history of frequency incontinence and on oxybutynin. Her Mini-Mental Status exam is stable. She returns for reevaluation  UPDATE June 6th 2016. She is with her husband at today's clinical visit, she continue has mild slow worsening memory trouble, she continued to drive short distances in a familiar route, she visit her friends, go to church regularly, able to clean house, independent at daily activity,  She also complains of bilateral feet numbness tingling, slow worsening, she is taking gabapentin 300 mg 2 tablets in the morning 3 tablets every night, does make her feel tired and sleepy,  She has not used her CPAP machine for a while, tends to go to bed late, take long nap in the afternoon,  She also complains of being fatigued, lack of energy   REVIEW OF SYSTEMS: Full 14 system  review of systems performed and notable only for those listed, all others are neg: Fatigue, blurred vision, cough, trouble swallowing, shortness of breath, cold intolerance, excessive thirst, swollen abdomen, abdominal pain, black stool, constipation, rectal pain, restless legs, apnea, daytime sleepiness, snoring, difficulty urinating, incontinence of bladder, decreased urination, joints pain, back pain, achy muscles, muscle cramps, neck pain, bruise easily, memory loss, weakness, anxiety    ALLERGIES: Allergies  Allergen Reactions  . Erythromycin Rash  . Penicillins Rash  . Tetracyclines & Related Rash    HOME MEDICATIONS: Outpatient Prescriptions Prior to Visit  Medication Sig Dispense Refill  . clobetasol (TEMOVATE) 0.05 % external solution     . diltiazem (CARDIZEM CD) 180 MG 24 hr capsule Take 1 capsule by mouth  daily for blood pressure 90 capsule 3  . econazole nitrate 1 % cream     . estradiol (ESTRACE) 0.1 MG/GM vaginal cream Place 1 Applicatorful vaginally as needed.    . gabapentin (NEURONTIN) 300 MG capsule Take 300 mg by mouth. 2 tab in am 3 tab in pm    . LORazepam (ATIVAN) 0.5 MG tablet 0.5 mg 2 (two) times daily. As needed for anxiety    . Omeprazole (HM OMEPRAZOLE) 20 MG TBEC Take 20 mg by mouth 2 (two) times daily.     Marland Kitchen. triamcinolone (KENALOG) 0.025 % cream Apply topically 2 (two) times daily.    . valACYclovir (VALTREX) 500 MG tablet     . venlafaxine XR (EFFEXOR XR) 150 MG 24 hr capsule Take 150 mg by mouth daily.    .Marland Kitchen  oxybutynin (DITROPAN) 5 MG tablet Take 5 mg by mouth 2 (two) times daily.       PAST MEDICAL HISTORY: Past Medical History  Diagnosis Date  . High blood pressure   . Neuropathy   . Anxiety   . Fibromyalgia   . GERD (gastroesophageal reflux disease)   . IBS (irritable bowel syndrome)   . HSV (herpes simplex virus) anogenital infection   . Restless leg syndrome   . Sleep apnea   . Memory loss   . Osteoarthritis   . Overactive bladder   .  Obesity (BMI 30-39.9) 04/01/2014    PAST SURGICAL HISTORY: Past Surgical History  Procedure Laterality Date  . Appendectomy    . Cesarean section    . Cholecystectomy    . Rotator cuff repair Left   . Cataract extraction Left   . Colonscopy      FAMILY HISTORY: Family History  Problem Relation Age of Onset  . Depression Mother   . Depression Father     SOCIAL HISTORY: History   Social History  . Marital Status: Married    Spouse Name: Dimas Aguas  . Number of Children: 2  . Years of Education: college   Occupational History    retired   Social History Main Topics  . Smoking status: Never Smoker   . Smokeless tobacco: Never Used  . Alcohol Use: 0.0 oz/week     Comment: wine occas  . Drug Use: No  . Sexual Activity: Not on file   Other Topics Concern  . Not on file   Social History Narrative   Patient lives at home with her husband Dimas Aguas) and she is retired. Patient has 2 years of college. Patient has 2 children.      PHYSICAL EXAM  Filed Vitals:   08/04/14 1135  BP: 115/65  Pulse: 97  Height: 5' (1.524 m)  Weight: 153 lb (69.4 kg)   Body mass index is 29.88 kg/(m^2).  PHYSICAL EXAMNIATION:  Gen: NAD, conversant, well nourised, obese, well groomed                     Cardiovascular: Regular rate rhythm, no peripheral edema, warm, nontender. Eyes: Conjunctivae clear without exudates or hemorrhage Neck: Supple, no carotid bruise. Pulmonary: Clear to auscultation bilaterally   NEUROLOGICAL EXAM:  MENTAL STATUS: Speech /cognition: Mini-Mental Status Examination is 28 out of 30, she is not oriented to date, missed one out of 3 recalls animal naming was 6  CRANIAL NERVES: CN II: Visual fields are full to confrontation. Fundoscopic exam is normal with sharp discs and no vascular changes. Venous pulsations are present bilaterally. Pupils are 4 mm and briskly reactive to light. Visual acuity is 20/20 bilaterally. CN III, IV, VI: extraocular movement are  normal. No ptosis. CN V: Facial sensation is intact to pinprick in all 3 divisions bilaterally. Corneal responses are intact.  CN VII: Face is symmetric with normal eye closure and smile. CN VIII: Hearing is normal to rubbing fingers CN IX, X: Palate elevates symmetrically. Phonation is normal. CN XI: Head turning and shoulder shrug are intact CN XII: Tongue is midline with normal movements and no atrophy.  MOTOR: There is no pronator drift of out-stretched arms. Muscle bulk and tone are normal. Muscle strength is normal.  REFLEXES: Reflexes are 2+ and symmetric at the biceps, triceps, knees, and ankles. Plantar responses are flexor.  SENSORY: Light touch, pinprick, position sense, and vibration sense are intact in fingers and toes.  COORDINATION:  Rapid alternating movements and fine finger movements are intact. There is no dysmetria on finger-to-nose and heel-knee-shin. There are no abnormal or extraneous movements.   GAIT/STANCE: Posture is normal. Gait is steady with normal steps, base, arm swing, and turning. Heel and toe walking are normal. Tandem gait is normal.  Romberg is absent.   DIAGNOSTIC DATA (LABS, IMAGING, TESTING) -ASSESSMENT AND PLAN  74 y.o. year old female   1, mild cognitive impairment, slow worsening over time, will start donepezil 10 mg daily, 2. Bilateral feet paresthesia, previous electrodiagnostic study was normal, most suggestive of small fiber neuropathy. Continue gabapentin 3, obstructive sleep apnea, advised patient complying with her CPAP machine, 4. Return to clinic in 6 months     Levert Feinstein, M.D. Ph.D.  [facility] [address] [place], [place] [address] Phone: [phone] Fax:      [phone]

## 2014-08-08 ENCOUNTER — Telehealth: Payer: Self-pay | Admitting: Cardiology

## 2014-08-08 NOTE — Telephone Encounter (Signed)
Spoke to patient to let her know the results. Orders were already in, so I have sent [facility] a message.

## 2014-08-08 NOTE — Telephone Encounter (Signed)
Left Message with husband to have his wife call me back

## 2014-08-08 NOTE — Telephone Encounter (Signed)
New problem ° ° ° °Pt want to know results of her sleep test. °

## 2014-08-11 ENCOUNTER — Telehealth: Payer: Self-pay | Admitting: Neurology

## 2014-08-11 NOTE — Telephone Encounter (Signed)
Patient called stating she was having side effects from  donepezil (ARICEPT) 10 MG tablet . She is having some nausea, vomiting, tired, headache, dizzy. She started this med on 08/04/14 and the symptoms started on the 08/05/14 and increasing daily. Please call and advise. Patient can be reached at [phone].

## 2014-08-11 NOTE — Telephone Encounter (Signed)
Spoke to [place] - she is agreeable to try to 0.5 tab for 2-3 weeks then increase to 1 tab daily.

## 2014-08-11 NOTE — Telephone Encounter (Signed)
Please call pt, to decrease donepezil to 10mg  1/2 tab everyday for 2-3 weeks, then titraying to full tab every day, make sure she take medication after meal.

## 2014-08-20 ENCOUNTER — Other Ambulatory Visit: Payer: Self-pay | Admitting: Obstetrics and Gynecology

## 2014-08-20 ENCOUNTER — Other Ambulatory Visit (HOSPITAL_COMMUNITY)
Admission: RE | Admit: 2014-08-20 | Discharge: 2014-08-20 | Disposition: A | Discharge status: Home / Self Care | Payer: Medicare Other | Source: Ambulatory Visit | Attending: Obstetrics and Gynecology | Admitting: Obstetrics and Gynecology

## 2014-08-20 DIAGNOSIS — Z124 Encounter for screening for malignant neoplasm of cervix: Secondary | ICD-10-CM | POA: Insufficient documentation

## 2014-08-20 DIAGNOSIS — Z1151 Encounter for screening for human papillomavirus (HPV): Secondary | ICD-10-CM | POA: Diagnosis present

## 2014-08-22 LAB — CYTOLOGY - PAP

## 2014-08-26 ENCOUNTER — Other Ambulatory Visit: Payer: Self-pay

## 2014-08-26 DIAGNOSIS — Z1231 Encounter for screening mammogram for malignant neoplasm of breast: Secondary | ICD-10-CM

## 2014-09-04 ENCOUNTER — Telehealth: Payer: Self-pay | Admitting: Cardiology

## 2014-09-04 NOTE — Telephone Encounter (Signed)
New message      [facility]

## 2014-09-04 NOTE — Telephone Encounter (Addendum)
[facility] let [facility] know that the reason they are having an issue with insurance is because the sleep lab did not do what was ordered.  We ordered a Split Night Study, but a titration only was performed. Therefore, her insurance is not willing to pay for a new machine based only on a titration - they need the full diagnostic report.   I am touching base with the patient to let her know the issue, then I will talk to Dr. Mayford Knifeurner so that she is aware of what is going on.

## 2014-09-04 NOTE — Telephone Encounter (Signed)
Spoke with pt. She stated that she has not been able to get her CPAP machine yet because they told her there was not a titration done.   I have [facility] and let him know that she has already had a titration and the order is in Epic.   He apologized for the delay/misunderstanding and said that he would take care of her.   I called pt back to let her know that they would be contacting her.  She appreciated my assistance.

## 2014-09-09 NOTE — Telephone Encounter (Signed)
Upon further investigation, a full split night study was conducted.    Reports are being updated to reflect such.  I have a call into [place] [facility] as well as to the patient to let them know another study will not need to be done.  As soon as I have the updated report, I will let [facility] know so that they can process the information correctly for the patient to get her CPAP Machine.

## 2014-09-16 ENCOUNTER — Telehealth: Payer: Self-pay | Admitting: Cardiology

## 2014-09-16 NOTE — Telephone Encounter (Signed)
New message     Pt needs a status update on CPAP machine. Pt had sleep test on April 12th   Please call to discuss

## 2014-09-16 NOTE — Telephone Encounter (Signed)
Let patient know that we are working on getting things straight with her insurance.  She is aware that she will hear from either myself or [facility] on her new machine.

## 2014-09-22 ENCOUNTER — Encounter: Payer: Self-pay | Admitting: Cardiology

## 2014-09-22 ENCOUNTER — Telehealth: Payer: Self-pay | Admitting: Cardiology

## 2014-09-22 NOTE — Telephone Encounter (Signed)
Reviewed sleep study results.  The patient was set up for a split night study because she needed a new machine.  The tech only did a CPAP titration and not the full split night study. She will need to have the study repeated as a split night PSG and the sleep lab should not charge the patient since this was a mistake by the tech

## 2014-09-22 NOTE — Telephone Encounter (Signed)
I have a left a message for Ginger Littie Deeds, manager for the sleep lab. She is out of the office today but will be back tomorrow.

## 2014-09-22 NOTE — Telephone Encounter (Signed)
This encounter was created in error - please disregard.

## 2014-09-26 ENCOUNTER — Ambulatory Visit
Admission: RE | Admit: 2014-09-26 | Discharge: 2014-09-26 | Disposition: A | Discharge status: Home / Self Care | Payer: Medicare Other | Source: Ambulatory Visit

## 2014-09-26 DIAGNOSIS — Z1231 Encounter for screening mammogram for malignant neoplasm of breast: Secondary | ICD-10-CM

## 2014-09-29 NOTE — Telephone Encounter (Signed)
I have left another message for Ginger.

## 2014-09-30 ENCOUNTER — Other Ambulatory Visit: Payer: Self-pay | Admitting: *Deleted

## 2014-09-30 DIAGNOSIS — G4733 Obstructive sleep apnea (adult) (pediatric): Secondary | ICD-10-CM

## 2014-09-30 NOTE — Telephone Encounter (Signed)
Since I have left two messages for Ginger to call, I have sent her an email asking that she call me.

## 2014-09-30 NOTE — Telephone Encounter (Signed)
Per Ginger:  "Problem found! It turns out after much research that the lights out tag is just in the wrong place which makes it appear that is wasn't a proper split night study. However, it is and she did meet split night criteria. Now all we have to do is go in and change the tag to make it correct and then reprint the report. The problem is that this study was done on our old system. We are trying to hook back up computers from the old system now so we can access this study on [employer] to correct the issue. The only concern is that we may have problems doing this now due to the old system.  We are working on it and I will get back to you ASAP to let you know if we were able to fix it or not."    I am waiting to hear back to make sure that we have the correct paperwork so that her insurance will cover her machine.

## 2014-10-06 NOTE — Progress Notes (Signed)
   NAME: Lynn Price DATE OF BIRTH:  1940-05-15 MEDICAL RECORD NUMBER [id number]  LOCATION: [facility]  PHYSICIAN: Even Budlong R  DATE OF STUDY: 06/10/2014  SLEEP STUDY TYPE: Split night Nocturnal Polysomnogram with CPAP titration               REFERRING PHYSICIAN: Quintella Reichert, MD  INDICATION FOR STUDY: OSA  EPWORTH SLEEPINESS SCORE: 16 HEIGHT: 5' (152.4 cm)  WEIGHT: 153 lb (69.4 kg)    Body mass index is 29.88 kg/(m^2).  NECK SIZE: 14 in.  MEDICATIONS: Reviewed in the chart  SLEEP ARCHITECTURE: During the diagnostic portion of the study, the patient slept for a total of 136 minutes out of a total sleep period of 157 minutes.  There was no REM sleep and 0.5 minutes of slow wave sleep.  The onset to sleep latency was prolonged at 70 minutes.  The sleep efficiency was reduced at 60%.  During the CPAP titration, the patient slept for a total of 152 minutes out of a total sleep period time of 170 minutes.  There was no REM or slow wave sleep.  The onset to sleep latency was 3 minutes.  The sleep efficiency was normal at 88%.    RESPIRATORY DATA: During the diagnostic portion of the test the patient had a total of 8 apneic events, of which, 7 were obstructive apneas and  1 was central and there were  25  hypopneas.  The total AHI was 14.6 consistent with mild to moderate OSA.  Most events occurred in nonsupine NREM sleep.  The patient was started on CPAP at 5cm H2O.  The patient did well on 5cm with an AHI of 0.8/hr but did not reach REM supine sleep.    OXYGEN DATA: During the diagnostic portion of the study, the oxygen saturation dropped as low as 83%.  The average oxygen saturation was 90%.  During the CPAP titration, the lowest oxygen saturation was 87% and the average was 91%.  The time spent with oxygen saturations < 88% during the CPAP titration was 0.1 minutes.    CARDIAC DATA: The patient maintained NSR throughout the study.  MOVEMENT/PARASOMNIA: There  were an increased number of periodic limb movements with a PLMS index of 60 per hour.  There were no REM sleep behavior disorders noted.  IMPRESSION/ RECOMMENDATION:   1.   Mild to moderate Obstructive sleep apnea/hypopnea syndrome with an AHI of 14.6/hr.  Most events occurred in the nonsupine position during NREM sleep. 2.  Oxygen desaturations associated with respiratory events as low as 83%.   3.  Abnormal sleep architecture with no REM sleep or slow wave sleep. 4.  Reduced sleep efficiency with increased frequency of arousals from respiratory events. 5.  Increased periodic limb movements with a PLMS index of 60/hr.   6.  Successful CPAP titration to 5cm H2O with heated humidifier and mask of choice. 7. The patient should be counseled in good sleep hygiene.  Signed: Armanda Magic, MD ABSM Diplomat, American Board of Sleep Medicine 06/10/2014  Quintella Reichert Diplomate, American Board of Sleep Medicine  ELECTRONICALLY SIGNED ON:  10/06/2014, 11:09 AM [facility] PH: [phone]   FX: [phone] ACCREDITED BY THE AMERICAN ACADEMY OF SLEEP MEDICINE

## 2014-11-26 ENCOUNTER — Ambulatory Visit: Payer: Medicare Other | Admitting: Cardiology

## 2014-12-04 ENCOUNTER — Encounter: Payer: Self-pay | Admitting: Cardiology

## 2014-12-09 ENCOUNTER — Telehealth: Payer: Self-pay | Admitting: *Deleted

## 2014-12-09 DIAGNOSIS — G4733 Obstructive sleep apnea (adult) (pediatric): Secondary | ICD-10-CM

## 2014-12-09 NOTE — Telephone Encounter (Signed)
-----   Message from Quintella Reichert, MD sent at 12/07/2014 11:18 PM EDT ----- AHI too high - please order 2 week autotitration from 4 to 20cm H2O

## 2014-12-09 NOTE — Telephone Encounter (Signed)
Patient is aware that we will be doing this.  [facility] Notified.

## 2015-01-08 ENCOUNTER — Ambulatory Visit (INDEPENDENT_AMBULATORY_CARE_PROVIDER_SITE_OTHER): Payer: Medicare Other | Admitting: Cardiology

## 2015-01-08 ENCOUNTER — Encounter: Payer: Self-pay | Admitting: Cardiology

## 2015-01-08 VITALS — BP 110/60 | HR 82 | Ht 65.0 in | Wt 143.0 lb

## 2015-01-08 DIAGNOSIS — G4733 Obstructive sleep apnea (adult) (pediatric): Secondary | ICD-10-CM

## 2015-01-08 DIAGNOSIS — I10 Essential (primary) hypertension: Secondary | ICD-10-CM

## 2015-01-08 DIAGNOSIS — E669 Obesity, unspecified: Secondary | ICD-10-CM

## 2015-01-08 NOTE — Patient Instructions (Signed)

## 2015-01-08 NOTE — Progress Notes (Signed)
Cardiology Office Note   Date:  01/08/2015   ID:  Lynn Fosterlaine C Teuscher, DOB 1940-12-04, MRN [id number]  PCP:  Thora LanceEHINGER,ROBERT R, MD    Chief Complaint  Patient presents with  . Sleep Apnea      History of Present Illness: Lynn Price is a 74 y.o. female with a history of OSA and HTN who presents today for followup. When I last saw her she was not doing well with her device. She stopped using her CPAP device and did not want to use it. She said it was hard to clean and irritated her at night. She underwent repeat sleep study which showed mild to moderate OSA with an AHI of 14/hr and had successful CPAP titration to 5cm H2O.  She is doing well with her new CPAP device.  She tolerates her nasal pillow mask and feels the pressure is adequate.  She feels rested in the am and does not take a nap during the day.   Past Medical History  Diagnosis Date  . High blood pressure   . Neuropathy (HCC)   . Anxiety   . Fibromyalgia   . GERD (gastroesophageal reflux disease)   . IBS (irritable bowel syndrome)   . HSV (herpes simplex virus) anogenital infection   . Restless leg syndrome   . Sleep apnea   . Memory loss   . Osteoarthritis   . Overactive bladder   . Obesity (BMI 30-39.9) 04/01/2014    Past Surgical History  Procedure Laterality Date  . Appendectomy    . Cesarean section    . Cholecystectomy    . Rotator cuff repair Left   . Cataract extraction Left   . Colonscopy    . Total knee arthroplasty       Current Outpatient Prescriptions  Medication Sig Dispense Refill  . clobetasol (TEMOVATE) 0.05 % external solution     . diltiazem (CARDIZEM CD) 180 MG 24 hr capsule Take 1 capsule by mouth  daily for blood pressure 90 capsule 3  . donepezil (ARICEPT) 10 MG tablet Take 1 tablet (10 mg total) by mouth at bedtime. 30 tablet 11  . estradiol (ESTRACE) 0.1 MG/GM vaginal cream Place 1 Applicatorful vaginally as needed.    . gabapentin (NEURONTIN) 300 MG  capsule Take 300 mg by mouth. 2 tab in am 3 tab in pm    . LORazepam (ATIVAN) 0.5 MG tablet 0.5 mg 2 (two) times daily. As needed for anxiety    . Omeprazole (HM OMEPRAZOLE) 20 MG TBEC Take 20 mg by mouth 2 (two) times daily.     Marland Kitchen. triamcinolone (KENALOG) 0.025 % cream Apply topically 2 (two) times daily.    . valACYclovir (VALTREX) 500 MG tablet Take 500 mg by mouth 2 (two) times daily.     Marland Kitchen. venlafaxine XR (EFFEXOR XR) 150 MG 24 hr capsule Take 150 mg by mouth daily.     No current facility-administered medications for this visit.    Allergies:   Erythromycin; Penicillins; and Tetracyclines & related    Social History:  The patient  reports that she has never smoked. She has never used smokeless tobacco. She reports that she drinks alcohol. She reports that she does not use illicit drugs.   Family History:  The patient's family history includes Depression in her father and mother.    ROS:  Please see the history of present illness.  Otherwise, review of systems are positive for none.   All other systems are reviewed and negative.    PHYSICAL EXAM: VS:  BP 110/60 mmHg  Pulse 82  Ht  (1.651 m)  Wt 143 lb (64.864 kg)  BMI 23.80 kg/m2 , BMI Body mass index is 23.8 kg/(m^2). GEN: Well nourished, well developed, in no acute distress HEENT: normal Neck: no JVD, carotid bruits, or masses Cardiac: RRR; no murmurs, rubs, or gallops,no edema  Respiratory:  clear to auscultation bilaterally, normal work of breathing GI: soft, nontender, nondistended, + BS MS: no deformity or atrophy Skin: warm and dry, no rash Neuro:  Strength and sensation are intact Psych: euthymic mood, full affect   EKG:  EKG is not ordered today.    Recent Labs: No results found for requested labs within last 365 days.    Lipid Panel No results found for: CHOL, TRIG, HDL, CHOLHDL, VLDL, LDLCALC, LDLDIRECT    Wt Readings from Last 3 Encounters:  01/08/15 143 lb (64.864 kg)  08/04/14 153 lb (69.4  kg)  06/10/14 153 lb (69.4 kg)     ASSESSMENT AND PLAN: 1.  OSA on CPAP and tolerating well.  Her d/l showed an AHI of 2/hr on 5cm H2O and 100% compliance in using more than 4 hours nigntly.  Patient has been using and benefiting from CPAP use and will continue to benefit from therapy.  2.  HTN - well controlled. Continue Diltiazem 3.  Obesity - she does not get much exercise. I have encouraged her to try to get into a walking routine    Current medicines are reviewed at length with the patient today.  The patient does not have concerns regarding medicines.  The following changes have been made:  no change  Labs/ tests ordered today: See above Assessment and Plan No orders of the defined types were placed in this encounter.     Disposition:   FU with me in 1 year  Signed, Quintella Reichert, MD  01/08/2015 10:59 AM    [facility] [address] [place], [place], [place]  [address] Phone: [phone]; Fax: [phone]

## 2015-01-09 ENCOUNTER — Encounter: Payer: Self-pay | Admitting: Cardiology

## 2015-02-03 ENCOUNTER — Ambulatory Visit (INDEPENDENT_AMBULATORY_CARE_PROVIDER_SITE_OTHER): Payer: Medicare Other | Admitting: Neurology

## 2015-02-03 ENCOUNTER — Encounter: Payer: Self-pay | Admitting: Neurology

## 2015-02-03 VITALS — BP 144/79 | HR 83 | Ht 65.0 in | Wt 138.0 lb

## 2015-02-03 DIAGNOSIS — R413 Other amnesia: Secondary | ICD-10-CM | POA: Diagnosis not present

## 2015-02-03 DIAGNOSIS — G4733 Obstructive sleep apnea (adult) (pediatric): Secondary | ICD-10-CM | POA: Diagnosis not present

## 2015-02-03 MED ORDER — MEMANTINE HCL 10 MG PO TABS: 10.0000 mg | ORAL_TABLET | Freq: Two times a day (BID) | ORAL | Status: DC

## 2015-02-03 NOTE — Progress Notes (Signed)
Chief Complaint  Patient presents with  . Memory Loss    MMSE 29/30 - 9 animals.  She is here with her husband, Lynn AguasHoward.  Feels Aricept has been helpful for her memory.  . Peripheral Neuropathy    Reports the pain and numbness in her feet has continued to worsen despite taking gabapentin.  . Sleep Apnea    Says she is compliant with her CPAP machine.    . Pruritis    She has chronic itching on her scalp.  She has seen a dermatologist for this condition but it did not improve with his treatment recommendations.     [facility]  [name] DOB: 01-14-1941   HISTORY OF PRESENT ILLNESS: Mrs.Lynn Price, 74 year old female was referred by her primary care physician Dr. Blair Price Lynn Price for evaluation of memory trouble. She was last seen in June 2015 by Lynn Jonesarolyn for memory loss  She has a history of fibromyalgia hypertension, anxiety, obstructive sleep apnea using CPAP and short-term memory trouble. She also has a history of bilateral feet paresthesias.  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  She denies significant family history of dementia   MRI of the brain 07/11/2011 shows mild changes of chronic microvascular ischemia and generalized cerebral atrophy.    The patient continues to drive and has gotten lost in unfamiliar surroundings. She continues to be independent with her activities of daily living. She plays golf intermittently otherwise she gets no regular exercise. She has not had any falls, long-standing history of frequency incontinence and on oxybutynin. Her Mini-Mental Status exam is stable. She returns for reevaluation  UPDATE June 6th 2016. She is with her husband at today's clinical visit, she continue has mild slow worsening memory trouble, she continued to drive short distances in a familiar route, she visit her friends, go to church regularly, able to clean house, independent at daily  activity,  She also complains of bilateral feet numbness tingling, slow worsening, she is taking gabapentin 300 mg 2 tablets in the morning 3 tablets every night, does make her feel tired and sleepy,  She has not used her CPAP machine for a while, tends to go to bed late, take long nap in the afternoon,  She also complains of being fatigued, lack of energy  UPDATE Feb 03 2015: She is with her husband at today's clinical visit, complains of depression, Aricept continue causing mild upset stomach, decreased appetite, mild worsening memory trouble, she also noticed her husband has been has mild memory trouble  REVIEW OF SYSTEMS: Full 14 system review of systems performed and notable only for those listed, all others are neg: Chill, fatigue, unexpected weight change, hearing loss, runny nose, trouble swallowing, drooling, eye itching, leg swelling, cold intolerance, swollen abdomen, abdominal pain, constipation, nausea, rectal pain, restless leg, insomnia, apnea, snoring, incontinence of bladder, joint pain, joint swelling, achy muscles, muscle cramps, bruise easily, headaches, numbness, weakness, agitation, behavior problem, decreased concentration depression anxiety    ALLERGIES: Allergies  Allergen Reactions  . Erythromycin Rash  . Penicillins Rash  . Tetracyclines & Related Rash    HOME MEDICATIONS: Outpatient Prescriptions Prior to Visit  Medication Sig Dispense Refill  . clobetasol (TEMOVATE) 0.05 % external solution     . diltiazem (CARDIZEM CD) 180 MG 24 hr capsule Take 1 capsule by mouth  daily for blood pressure 90 capsule 3  . econazole nitrate 1 % cream     . estradiol (ESTRACE)  0.1 MG/GM vaginal cream Place 1 Applicatorful vaginally as needed.    . gabapentin (NEURONTIN) 300 MG capsule Take 300 mg by mouth. 2 tab in am 3 tab in pm    . LORazepam (ATIVAN) 0.5 MG tablet 0.5 mg 2 (two) times daily. As needed for anxiety    . Omeprazole (HM OMEPRAZOLE) 20 MG TBEC Take 20 mg by  mouth 2 (two) times daily.     Marland Kitchen triamcinolone (KENALOG) 0.025 % cream Apply topically 2 (two) times daily.    . valACYclovir (VALTREX) 500 MG tablet     . venlafaxine XR (EFFEXOR XR) 150 MG 24 hr capsule Take 150 mg by mouth daily.    Marland Kitchen oxybutynin (DITROPAN) 5 MG tablet Take 5 mg by mouth 2 (two) times daily.       PAST MEDICAL HISTORY: Past Medical History  Diagnosis Date  . High blood pressure   . Neuropathy (HCC)   . Anxiety   . Fibromyalgia   . GERD (gastroesophageal reflux disease)   . IBS (irritable bowel syndrome)   . HSV (herpes simplex virus) anogenital infection   . Restless leg syndrome   . Sleep apnea   . Memory loss   . Osteoarthritis   . Overactive bladder   . Obesity (BMI 30-39.9) 04/01/2014    PAST SURGICAL HISTORY: Past Surgical History  Procedure Laterality Date  . Appendectomy    . Cesarean section    . Cholecystectomy    . Rotator cuff repair Left   . Cataract extraction Left   . Colonscopy    . Total knee arthroplasty    . Multiple tooth extractions      FAMILY HISTORY: Family History  Problem Relation Age of Onset  . Depression Mother   . Depression Father     SOCIAL HISTORY: History   Social History  . Marital Status: Married    Spouse Name: Lynn Price  . Number of Children: 2  . Years of Education: college   Occupational History    retired   Social History Main Topics  . Smoking status: Never Smoker   . Smokeless tobacco: Never Used  . Alcohol Use: 0.0 oz/week     Comment: wine occas  . Drug Use: No  . Sexual Activity: Not on file   Other Topics Concern  . Not on file   Social History Narrative   Patient lives at home with her husband Lynn Price) and she is retired. Patient has 2 years of college. Patient has 2 children.      PHYSICAL EXAM  Filed Vitals:   02/03/15 1123  BP: 144/79  Pulse: 83  Height:  (1.651 m)  Weight: 138 lb (62.596 kg)   Body mass index is 22.96 kg/(m^2).  PHYSICAL EXAMNIATION:  Gen: NAD,  conversant, well nourised, obese, well groomed                     Cardiovascular: Regular rate rhythm, no peripheral edema, warm, nontender. Eyes: Conjunctivae clear without exudates or hemorrhage Neck: Supple, no carotid bruise. Pulmonary: Clear to auscultation bilaterally   NEUROLOGICAL EXAM:  MENTAL STATUS: MENTAL STATUS: Speech:    Speech is normal; fluent and spontaneous with normal comprehension.  Cognition: MMSE 29/30, animal naming 9     Orientation to time, place and person: not oriented to date     Normal recent and remote memory     Normal Attention span and concentration     Normal Language, naming, repeating,spontaneous speech  Fund of knowledge   CRANIAL NERVES: CN II: Visual fields are full to confrontation. Fundoscopic exam is normal with sharp discs and no vascular changes. Venous pulsations are present bilaterally. Pupils are 4 mm and briskly reactive to light. Visual acuity is 20/20 bilaterally. CN III, IV, VI: extraocular movement are normal. No ptosis. CN V: Facial sensation is intact to pinprick in all 3 divisions bilaterally. Corneal responses are intact.  CN VII: Face is symmetric with normal eye closure and smile. CN VIII: Hearing is normal to rubbing fingers CN IX, X: Palate elevates symmetrically. Phonation is normal. CN XI: Head turning and shoulder shrug are intact CN XII: Tongue is midline with normal movements and no atrophy.  MOTOR: There is no pronator drift of out-stretched arms. Muscle bulk and tone are normal. Muscle strength is normal.  REFLEXES: Reflexes are 2+ and symmetric at the biceps, triceps, knees, and ankles. Plantar responses are flexor.  SENSORY: Light touch, pinprick, position sense, and vibration sense are intact in fingers and toes.  COORDINATION: Rapid alternating movements and fine finger movements are intact. There is no dysmetria on finger-to-nose and heel-knee-shin. There are no abnormal or extraneous movements.    GAIT/STANCE: Posture is normal. Gait is steady with normal steps, base, arm swing, and turning. Heel and toe walking are normal. Tandem gait is normal.  Romberg is absent.   DIAGNOSTIC DATA (LABS, IMAGING, TESTING) -ASSESSMENT AND PLAN  74 y.o. year old female  Mild cognitive impairment  slow worsening over time, will start donepezil 10 mg daily, add on Namenda 10 mg twice a day Bilateral feet paresthesia,   previous electrodiagnostic study was normal, most suggestive of small fiber neuropathy. Continue gabapentin Obstructive sleep apnea'  advised patient complying with her CPAP machine,     Levert Feinstein, M.D. Ph.D.  [facility] [address]  [place], [place] [address] Phone: [phone] Fax:      [phone]

## 2015-02-12 ENCOUNTER — Encounter: Payer: Self-pay | Admitting: *Deleted

## 2015-02-12 ENCOUNTER — Telehealth: Payer: Self-pay | Admitting: Neurology

## 2015-02-12 NOTE — Telephone Encounter (Signed)
Since starting Namenda, she has been moody, emotional and overall, not feeling well.  She went to her PCP today and is weaning off the medication.  She will stay on donepezil.

## 2015-02-12 NOTE — Telephone Encounter (Signed)
Patient called to advise she saw PA at PCP's office and was advised to cut back on memantine (NAMENDA) 10 MG tablet to once a day for 3-5 days and then stop.

## 2015-03-25 ENCOUNTER — Ambulatory Visit
Admission: RE | Admit: 2015-03-25 | Discharge: 2015-03-25 | Disposition: A | Discharge status: Home / Self Care | Payer: Medicare Other | Source: Ambulatory Visit | Attending: Family Medicine | Admitting: Family Medicine

## 2015-03-25 ENCOUNTER — Other Ambulatory Visit: Payer: Self-pay | Admitting: Family Medicine

## 2015-03-25 DIAGNOSIS — J3489 Other specified disorders of nose and nasal sinuses: Secondary | ICD-10-CM

## 2015-03-26 ENCOUNTER — Telehealth: Payer: Self-pay | Admitting: Neurology

## 2015-03-26 NOTE — Telephone Encounter (Signed)
Pt called said the donepezil (ARICEPT) 10 MG tablet is making her nauseated and she said she hasn't felt good since she started taking it. She's had diarrhea since yesterday . She saw PCP (Dr George Ina) today and has lost 20 lbs since she started taking medication in June 2016. Her PCP suggested to discuss the problems with Dr Terrace Arabia. PCP is also concerned about her weight loss.

## 2015-03-26 NOTE — Telephone Encounter (Signed)
Per Dr. Terrace Arabia, ok to stop medication to see if it is the cause of her side effects.  Patient agreeable and will keep her follow up appt.  I invited her to call us back with any further questions or concerns.

## 2015-03-27 ENCOUNTER — Encounter (HOSPITAL_COMMUNITY): Payer: Self-pay | Admitting: Emergency Medicine

## 2015-03-27 ENCOUNTER — Emergency Department (HOSPITAL_COMMUNITY)
Admission: EM | Admit: 2015-03-27 | Discharge: 2015-03-27 | Disposition: A | Discharge status: Home / Self Care | Payer: Medicare Other | Attending: Emergency Medicine | Admitting: Emergency Medicine

## 2015-03-27 DIAGNOSIS — E669 Obesity, unspecified: Secondary | ICD-10-CM | POA: Diagnosis not present

## 2015-03-27 DIAGNOSIS — M797 Fibromyalgia: Secondary | ICD-10-CM | POA: Diagnosis not present

## 2015-03-27 DIAGNOSIS — M1990 Unspecified osteoarthritis, unspecified site: Secondary | ICD-10-CM | POA: Insufficient documentation

## 2015-03-27 DIAGNOSIS — J029 Acute pharyngitis, unspecified: Secondary | ICD-10-CM | POA: Insufficient documentation

## 2015-03-27 DIAGNOSIS — K589 Irritable bowel syndrome without diarrhea: Secondary | ICD-10-CM | POA: Insufficient documentation

## 2015-03-27 DIAGNOSIS — Z7952 Long term (current) use of systemic steroids: Secondary | ICD-10-CM | POA: Insufficient documentation

## 2015-03-27 DIAGNOSIS — G2581 Restless legs syndrome: Secondary | ICD-10-CM | POA: Diagnosis not present

## 2015-03-27 DIAGNOSIS — Z8619 Personal history of other infectious and parasitic diseases: Secondary | ICD-10-CM | POA: Insufficient documentation

## 2015-03-27 DIAGNOSIS — L500 Allergic urticaria: Secondary | ICD-10-CM | POA: Diagnosis not present

## 2015-03-27 DIAGNOSIS — T370X5A Adverse effect of sulfonamides, initial encounter: Secondary | ICD-10-CM | POA: Insufficient documentation

## 2015-03-27 DIAGNOSIS — R21 Rash and other nonspecific skin eruption: Secondary | ICD-10-CM | POA: Diagnosis present

## 2015-03-27 DIAGNOSIS — Z880 Allergy status to penicillin: Secondary | ICD-10-CM | POA: Insufficient documentation

## 2015-03-27 DIAGNOSIS — F419 Anxiety disorder, unspecified: Secondary | ICD-10-CM | POA: Insufficient documentation

## 2015-03-27 DIAGNOSIS — Z79899 Other long term (current) drug therapy: Secondary | ICD-10-CM | POA: Diagnosis not present

## 2015-03-27 DIAGNOSIS — K219 Gastro-esophageal reflux disease without esophagitis: Secondary | ICD-10-CM | POA: Diagnosis not present

## 2015-03-27 DIAGNOSIS — R197 Diarrhea, unspecified: Secondary | ICD-10-CM | POA: Insufficient documentation

## 2015-03-27 DIAGNOSIS — N390 Urinary tract infection, site not specified: Secondary | ICD-10-CM | POA: Diagnosis not present

## 2015-03-27 DIAGNOSIS — T50905A Adverse effect of unspecified drugs, medicaments and biological substances, initial encounter: Secondary | ICD-10-CM

## 2015-03-27 LAB — CBC WITH DIFFERENTIAL/PLATELET
Basophils Absolute: 0 10*3/uL (ref 0.0–0.1)
Basophils Relative: 0 %
Eosinophils Absolute: 0.1 10*3/uL (ref 0.0–0.7)
Eosinophils Relative: 1 %
HCT: 34.4 % — ABNORMAL LOW (ref 36.0–46.0)
Hemoglobin: 11 g/dL — ABNORMAL LOW (ref 12.0–15.0)
Lymphocytes Relative: 12 %
Lymphs Abs: 1.2 10*3/uL (ref 0.7–4.0)
MCH: 28.4 pg (ref 26.0–34.0)
MCHC: 32 g/dL (ref 30.0–36.0)
MCV: 88.9 fL (ref 78.0–100.0)
Monocytes Absolute: 0.6 10*3/uL (ref 0.1–1.0)
Monocytes Relative: 6 %
Neutro Abs: 7.7 10*3/uL (ref 1.7–7.7)
Neutrophils Relative %: 81 %
Platelets: 181 10*3/uL (ref 150–400)
RBC: 3.87 MIL/uL (ref 3.87–5.11)
RDW: 14.3 % (ref 11.5–15.5)
WBC: 9.5 10*3/uL (ref 4.0–10.5)

## 2015-03-27 LAB — BASIC METABOLIC PANEL
Anion gap: 13 (ref 5–15)
BUN: 12 mg/dL (ref 6–20)
CO2: 23 mmol/L (ref 22–32)
Calcium: 8.8 mg/dL — ABNORMAL LOW (ref 8.9–10.3)
Chloride: 104 mmol/L (ref 101–111)
Creatinine, Ser: 0.88 mg/dL (ref 0.44–1.00)
GFR calc Af Amer: >60 mL/min (ref >60)
GFR calc non Af Amer: >60 mL/min (ref >60)
Glucose, Bld: 106 mg/dL — ABNORMAL HIGH (ref 65–99)
Potassium: 3.7 mmol/L (ref 3.5–5.1)
Sodium: 140 mmol/L (ref 135–145)

## 2015-03-27 LAB — URINALYSIS, ROUTINE W REFLEX MICROSCOPIC
Bilirubin Urine: NEGATIVE
Glucose, UA: NEGATIVE mg/dL
Hgb urine dipstick: NEGATIVE
Ketones, ur: NEGATIVE mg/dL
Nitrite: POSITIVE — AB
Protein, ur: NEGATIVE mg/dL
Specific Gravity, Urine: 1.003 — ABNORMAL LOW (ref 1.005–1.030)
pH: 6 (ref 5.0–8.0)

## 2015-03-27 LAB — URINE MICROSCOPIC-ADD ON

## 2015-03-27 LAB — CBG MONITORING, ED: Glucose-Capillary: 93 mg/dL (ref 65–99)

## 2015-03-27 MED ORDER — FOSFOMYCIN TROMETHAMINE 3 G PO PACK
3.0000 g | PACK | Freq: Once | ORAL | Status: AC
Start: 2015-03-27 — End: 2015-03-27
  Administered 2015-03-27: 3 g via ORAL
  Filled 2015-03-27: qty 3

## 2015-03-27 MED ORDER — METHYLPREDNISOLONE SODIUM SUCC 125 MG IJ SOLR
125.0000 mg | Freq: Once | INTRAMUSCULAR | Status: AC
  Administered 2015-03-27: 125 mg via INTRAVENOUS
  Filled 2015-03-27: qty 2

## 2015-03-27 MED ORDER — SODIUM CHLORIDE 0.9 % IV SOLN
Freq: Once | INTRAVENOUS | Status: AC
  Administered 2015-03-27: 01:00:00 via INTRAVENOUS

## 2015-03-27 MED ORDER — FAMOTIDINE IN NACL 20-0.9 MG/50ML-% IV SOLN
20.0000 mg | Freq: Once | INTRAVENOUS | Status: AC
  Administered 2015-03-27: 20 mg via INTRAVENOUS
  Filled 2015-03-27: qty 50

## 2015-03-27 MED ORDER — PREDNISONE 20 MG PO TABS: ORAL_TABLET | ORAL | Status: DC

## 2015-03-27 MED ORDER — DIPHENHYDRAMINE HCL 50 MG/ML IJ SOLN
25.0000 mg | Freq: Once | INTRAMUSCULAR | Status: AC
Start: 2015-03-27 — End: 2015-03-27
  Administered 2015-03-27: 25 mg via INTRAVENOUS
  Filled 2015-03-27: qty 1

## 2015-03-27 NOTE — ED Notes (Signed)
CBG 93. Rn notified.

## 2015-03-27 NOTE — ED Provider Notes (Signed)
CSN: [id number]     Arrival date & time 03/27/15  0036 History  By signing my name below, I, Freida Busman, attest that this documentation has been prepared under the direction and in the presence of Gilda Crease, MD . Electronically Signed: Freida Busman, Scribe. 03/27/2015. 1:10 AM.    Chief Complaint  Patient presents with  . Allergic Reaction    The history is provided by the patient and the EMS personnel. No language interpreter was used.    HPI Comments:  Lynn Price is a 75 y.o. female brought in by ambulance who presents to the Emergency Department complaining of diffuse pruritic rash x ~ 2 hours. She also describes a tingling sensation to her BUE and reports associated throat discomfort and diarrhea. She denies trouble swallowing, vomiting, and difficulty breathing. Pt notes her symptoms began shortly after taking first dose of sulfa ~ 2230. She has a h/o allergic reactions to other antibiotics- PCN, tetracycline, and erythromycin. She has taken 50 mg benadryl PTA with mild relief.    Past Medical History  Diagnosis Date  . High blood pressure   . Neuropathy (HCC)   . Anxiety   . Fibromyalgia   . GERD (gastroesophageal reflux disease)   . IBS (irritable bowel syndrome)   . HSV (herpes simplex virus) anogenital infection   . Restless leg syndrome   . Sleep apnea   . Memory loss   . Osteoarthritis   . Overactive bladder   . Obesity (BMI 30-39.9) 04/01/2014   Past Surgical History  Procedure Laterality Date  . Appendectomy    . Cesarean section    . Cholecystectomy    . Rotator cuff repair Left   . Cataract extraction Left   . Colonscopy    . Total knee arthroplasty    . Multiple tooth extractions     Family History  Problem Relation Age of Onset  . Depression Mother   . Depression Father    Social History  Substance Use Topics  . Smoking status: Never Smoker   . Smokeless tobacco: Never Used  . Alcohol Use: 0.0 oz/week     Comment: wine occas    OB History    No data available     Review of Systems  HENT: Positive for sore throat (discomfort). Negative for trouble swallowing.   Respiratory: Negative for shortness of breath.   Gastrointestinal: Positive for diarrhea. Negative for vomiting.  Skin: Positive for rash.  All other systems reviewed and are negative.   Allergies  Erythromycin; Penicillins; Sulfa antibiotics; and Tetracyclines & related  Home Medications   Prior to Admission medications   Medication Sig Start Date End Date Taking? Authorizing Provider  estradiol (ESTRACE) 0.1 MG/GM vaginal cream Place 1 Applicatorful vaginally as needed.   Yes Historical Provider, MD  gabapentin (NEURONTIN) 300 MG capsule Take 600-900 mg by mouth 2 (two) times daily. 2 tab in am 3 tab in pm   Yes Historical Provider, MD  LORazepam (ATIVAN) 0.5 MG tablet Take 1 mg by mouth 2 (two) times daily.  07/10/12  Yes Historical Provider, MD  Omeprazole (HM OMEPRAZOLE) 20 MG TBEC Take 20 mg by mouth 2 (two) times daily.    Yes Historical Provider, MD  venlafaxine XR (EFFEXOR XR) 150 MG 24 hr capsule Take 150 mg by mouth daily.   Yes Historical Provider, MD  clobetasol (TEMOVATE) 0.05 % external solution  06/26/13   Historical Provider, MD  diltiazem (CARDIZEM CD) 180 MG 24 hr capsule Take  1 capsule by mouth  daily for blood pressure 04/09/14   Quintella Reichert, MD  triamcinolone (KENALOG) 0.025 % cream Apply topically 2 (two) times daily.    Historical Provider, MD  valACYclovir (VALTREX) 500 MG tablet Take 500 mg by mouth 2 (two) times daily.  05/16/13   Historical Provider, MD   BP 97/67 mmHg  Pulse 89  Temp(Src) 95.4 F (35.2 C) (Rectal)  Resp 12  Ht  (1.651 m)  Wt 132 lb (59.875 kg)  BMI 21.97 kg/m2  SpO2 95% Physical Exam  Constitutional: She is oriented to person, place, and time. She appears well-developed and well-nourished. No distress.  HENT:  Head: Normocephalic and atraumatic.  Right Ear: Hearing normal.  Left Ear:  Hearing normal.  Nose: Nose normal.  Mouth/Throat: Oropharynx is clear and moist and mucous membranes are normal.  Eyes: Conjunctivae and EOM are normal. Pupils are equal, round, and reactive to light.  Neck: Normal range of motion. Neck supple.  Cardiovascular: Regular rhythm, S1 normal and S2 normal.  Exam reveals no gallop and no friction rub.   No murmur heard. Pulmonary/Chest: Effort normal and breath sounds normal. No respiratory distress. She exhibits no tenderness.  Abdominal: Soft. Normal appearance and bowel sounds are normal. There is no hepatosplenomegaly. There is no tenderness. There is no rebound, no guarding, no tenderness at McBurney's point and negative Murphy's sign. No hernia.  Musculoskeletal: Normal range of motion.  Neurological: She is alert and oriented to person, place, and time. She has normal strength. No cranial nerve deficit or sensory deficit. Coordination normal. GCS eye subscore is 4. GCS verbal subscore is 5. GCS motor subscore is 6.  Skin: Skin is warm, dry and intact. Rash noted. Rash is urticarial. No cyanosis.  Diffuse urticaria  Psychiatric: She has a normal mood and affect. Her speech is normal and behavior is normal. Thought content normal.  Nursing note and vitals reviewed.   ED Course  Procedures   DIAGNOSTIC STUDIES:  Oxygen Saturation is 96% on RA, normal by my interpretation.    COORDINATION OF CARE:  12:38 AM Discussed treatment plan with pt at bedside and pt agreed to plan.  Labs Review Labs Reviewed  CBC WITH DIFFERENTIAL/PLATELET - Abnormal; Notable for the following:    Hemoglobin 11.0 (*)    HCT 34.4 (*)    All other components within normal limits  BASIC METABOLIC PANEL - Abnormal; Notable for the following:    Glucose, Bld 106 (*)    Calcium 8.8 (*)    All other components within normal limits  URINALYSIS, ROUTINE W REFLEX MICROSCOPIC (NOT AT [facility])  CBG MONITORING, ED    I have personally reviewed and evaluated these lab  results as part of my medical decision-making.   EKG Interpretation None      MDM   Final diagnoses:  None  acute drug reaction UTI  Presents to the emergency part for evaluation of acute allergic reaction. Patient reports onset of diffuse redness, swelling, itching after taking the first dose of Bactrim. Looking through the records it appears she was prescribed Bactrim by her doctor for sinus infection. She had diffuse erythroderma and urticaria at arrival. This has completely resolved here in the ER for treatment. She was observed for an extended period of time and continues to do well. Vital signs are stable. Patient will be discharged, follow-up with her doctor. Blood work was normal, however, urinalysis does show positive nitrite, positive leukocytes, 6-30 white cells, many bacteria. This  was a catheterized specimen. Patient does self catheterize at home, this is concerning for infection. Will treat with fosfomycin. Urine culture has been sent.  I personally performed the services described in this documentation, which was scribed in my presence. The recorded information has been reviewed and is accurate.      Gilda Crease, MD 03/27/15 [phone]

## 2015-03-27 NOTE — Discharge Instructions (Signed)
Drug Allergy A drug allergy means you have a strange reaction to a medicine. You may have puffiness (swelling), itching, red rashes, and hives. Some allergic reactions can be life-threatening. HOME CARE  If you do not know what caused your reaction:  Write down medicines you use.  Write down any problems you have after using medicine.  Avoid things that cause a reaction.  You can see an allergy doctor to be tested for allergies. If you have hives or a rash:  Take medicine as told by your doctor.  Place cold cloths on your skin.  Do not take hot baths or hot showers. Take baths in cool water. If you are severely allergic:  Wear a medical bracelet or necklace that lists your allergy.  Carry your allergy kit or medicine shot to treat severe allergic reactions with you. These can save your life.  Do not drive until medicine from your shot has worn off, unless your doctor says it is okay. GET HELP RIGHT AWAY IF:   Your mouth is puffy, or you have trouble breathing.  You have a tight feeling in your chest or throat.  You have hives, puffiness, or itching all over your body.  You throw up (vomit) or have watery poop (diarrhea).  You feel dizzy or pass out (faint).  You think you are having a reaction. Problems often start within 30 minutes after taking a medicine.  You are getting worse, not better.  You have new problems.  Your problems go away and then come back. This is an emergency. Use your medicine shot or allergy kit as told. Call yourlocal emergency services (911 in [place] after the shot. Even if you feel better after the shot, you need to go to the hospital. You may need more medicine to control a severe reaction. MAKE SURE YOU:  Understand these instructions.  Will watch your condition.  Will get help right away if you are not doing well or get worse.   This information is not intended to replace advice given to you by your health care provider. Make sure you  discuss any questions you have with your health care provider.   Document Released: 03/24/2004 Document Revised: 05/09/2011 Document Reviewed: 09/16/2014 Elsevier Interactive Patient Education 2016 Else[employer]  Urinary Tract Infection A urinary tract infection (UTI) can occur any place along the urinary tract. The tract includes the kidneys, ureters, bladder, and urethra. A type of germ called bacteria often causes a UTI. UTIs are often helped with antibiotic medicine.  HOME CARE   If given, take antibiotics as told by your doctor. Finish them even if you start to feel better.  Drink enough fluids to keep your pee (urine) clear or pale yellow.  Avoid tea, drinks with caffeine, and bubbly (carbonated) drinks.  Pee often. Avoid holding your pee in for a long time.  Pee before and after having sex (intercourse).  Wipe from front to back after you poop (bowel movement) if you are a woman. Use each tissue only once. GET HELP RIGHT AWAY IF:   You have back pain.  You have lower belly (abdominal) pain.  You have chills.  You feel sick to your stomach (nauseous).  You throw up (vomit).  Your burning or discomfort with peeing does not go away.  You have a fever.  Your symptoms are not better in 3 days. MAKE SURE YOU:   Understand these instructions.  Will watch your condition.  Will get help right away if you are  not doing well or get worse.   This information is not intended to replace advice given to you by your health care provider. Make sure you discuss any questions you have with your health care provider.   Document Released: 08/03/2007 Document Revised: 03/07/2014 Document Reviewed: 09/15/2011 Elsevier Interactive Patient Education [employer].

## 2015-03-27 NOTE — ED Notes (Signed)
Pt brought to ED by [facility] for Allergic reaction to sulfa, pt started taking sulfa abx a 2200 and at 2300 started having rash all over her body, diarrhea and sore throat, pt gave her self 50 mg of Benadryl with no relieve.

## 2015-07-22 ENCOUNTER — Encounter: Payer: Self-pay | Admitting: Rheumatology

## 2015-08-05 ENCOUNTER — Ambulatory Visit: Payer: Medicare Other | Admitting: Adult Health

## 2015-08-06 ENCOUNTER — Encounter: Payer: Self-pay | Admitting: Adult Health

## 2015-08-12 ENCOUNTER — Encounter: Payer: Self-pay | Admitting: Adult Health

## 2015-08-12 ENCOUNTER — Ambulatory Visit (INDEPENDENT_AMBULATORY_CARE_PROVIDER_SITE_OTHER): Payer: Medicare Other | Admitting: Adult Health

## 2015-08-12 VITALS — BP 126/70 | HR 78 | Resp 16 | Ht 65.0 in | Wt 137.4 lb

## 2015-08-12 DIAGNOSIS — R413 Other amnesia: Secondary | ICD-10-CM | POA: Diagnosis not present

## 2015-08-12 DIAGNOSIS — R202 Paresthesia of skin: Secondary | ICD-10-CM | POA: Diagnosis not present

## 2015-08-12 NOTE — Progress Notes (Signed)
[name] DOB: 09/16/1940  REASON FOR VISIT: follow up- memory impairment, neuropathy HISTORY FROM: patient  HISTORY OF PRESENT ILLNESS: Today 08/12/2015: Ms. Lynn Price is a 75 year old female with a history of mild memory impairment and neuropathy. She returns today for follow-up. She states that her memory has remained the same. She is able to complete all ADLs independently. She operates a Librarian, academicmotor vehicle without difficulty. She prepares meals at home. Her husband is retired and he has now taken over the finances. She denies having to give up anything due to her memory. She is no longer on Aricept nor Namenda. She could not tolerate these medications. She continues to have paresthesias in the feet. She uses gabapentin with good benefit. Denies any changes in her gait or balance. She does not use an assistive device when ambulating. Denies any falls. She returns today for an evaluation.  HISTORY [place](YAN): Mrs.Lynn Price, [age] year old female was referred by her primary care physician Dr. Blair Heysobert Ehinger for evaluation of memory trouble. She was last seen in June 2015 by Eber Jonesarolyn for memory loss  She has a history of fibromyalgia hypertension, anxiety, obstructive sleep apnea using CPAP and short-term memory trouble. She also has a history of bilateral feet paresthesias. 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 She denies significant family history of dementia   MRI of the brain 07/11/2011 shows mild changes of chronic microvascular ischemia and generalized cerebral atrophy.   The patient continues to drive and has gotten lost in unfamiliar surroundings. She continues to be independent with her activities of daily living. She plays golf intermittently otherwise she gets no regular exercise. She has not had any falls, long-standing history of frequency incontinence and on oxybutynin. Her Mini-Mental Status exam is stable. She returns for  reevaluation  UPDATE June 6th 2016. She is with her husband at today's clinical visit, she continue has mild slow worsening memory trouble, she continued to drive short distances in a familiar route, she visit her friends, go to church regularly, able to clean house, independent at daily activity,  She also complains of bilateral feet numbness tingling, slow worsening, she is taking gabapentin 300 mg 2 tablets in the morning 3 tablets every night, does make her feel tired and sleepy,  She has not used her CPAP machine for a while, tends to go to bed late, take long nap in the afternoon,  She also complains of being fatigued, lack of energy  UPDATE Feb 03 2015: She is with her husband at today's clinical visit, complains of depression, Aricept continue causing mild upset stomach, decreased appetite, mild worsening memory trouble, she also noticed her husband has been has mild memory trouble  REVIEW OF SYSTEMS: Out of a complete 14 system review of symptoms, the patient complains only of the following symptoms, and all other reviewed systems are negative.  See history of present illness  ALLERGIES: Allergies  Allergen Reactions  . Erythromycin Rash  . Penicillins Rash  . Sulfa Antibiotics Rash  . Tetracyclines & Related Rash    HOME MEDICATIONS: Outpatient Prescriptions Prior to Visit  Medication Sig Dispense Refill  . clobetasol (TEMOVATE) 0.05 % external solution     . diltiazem (CARDIZEM CD) 180 MG 24 hr capsule Take 1 capsule by mouth  daily for blood pressure 90 capsule 3  . estradiol (ESTRACE) 0.1 MG/GM vaginal cream Place 1 Applicatorful vaginally as needed.    . gabapentin (NEURONTIN) 300 MG capsule Take  600-900 mg by mouth 2 (two) times daily. 2 tab in am 3 tab in pm    . LORazepam (ATIVAN) 0.5 MG tablet Take 1 mg by mouth 2 (two) times daily.     . Omeprazole (HM OMEPRAZOLE) 20 MG TBEC Take 20 mg by mouth 2 (two) times daily.     . predniSONE (DELTASONE) 20 MG tablet 3 tabs  po daily x 3 days, then 2 tabs x 3 days, then 1.5 tabs x 3 days, then 1 tab x 3 days, then 0.5 tabs x 3 days 27 tablet 0  . triamcinolone (KENALOG) 0.025 % cream Apply topically 2 (two) times daily.    . valACYclovir (VALTREX) 500 MG tablet Take 500 mg by mouth 2 (two) times daily.     Marland Kitchen venlafaxine XR (EFFEXOR XR) 150 MG 24 hr capsule Take 150 mg by mouth daily.     No facility-administered medications prior to visit.    PAST MEDICAL HISTORY: Past Medical History  Diagnosis Date  . High blood pressure   . Neuropathy (HCC)   . Anxiety   . Fibromyalgia   . GERD (gastroesophageal reflux disease)   . IBS (irritable bowel syndrome)   . HSV (herpes simplex virus) anogenital infection   . Restless leg syndrome   . Sleep apnea   . Memory loss   . Osteoarthritis   . Overactive bladder   . Obesity (BMI 30-39.9) 04/01/2014    PAST SURGICAL HISTORY: Past Surgical History  Procedure Laterality Date  . Appendectomy    . Cesarean section    . Cholecystectomy    . Rotator cuff repair Left   . Cataract extraction Left   . Colonscopy    . Total knee arthroplasty    . Multiple tooth extractions      FAMILY HISTORY: Family History  Problem Relation Age of Onset  . Depression Mother   . Depression Father     SOCIAL HISTORY: Social History   Social History  . Marital Status: Married    Spouse Name: Dimas Aguas  . Number of Children: 2  . Years of Education: college   Occupational History  .      retired   Social History Main Topics  . Smoking status: Never Smoker   . Smokeless tobacco: Never Used  . Alcohol Use: 0.0 oz/week     Comment: wine occas  . Drug Use: No  . Sexual Activity: Not on file   Other Topics Concern  . Not on file   Social History Narrative   Patient lives at home with her husband Dimas Aguas) and she is retired. Patient has 2 years of college. Patient has 2 children.       PHYSICAL EXAM  Filed Vitals:   08/12/15 0949  BP: 126/70  Pulse: 78  Resp:  16  Height:  (1.651 m)  Weight: 137 lb 6.4 oz (62.324 kg)   Body mass index is 22.86 kg/(m^2).   MMSE - Mini Mental State Exam 02/03/2015 08/04/2014  Orientation to time 4 4  Orientation to Place 5 5  Registration 3 3  Attention/ Calculation 5 5  Recall 3 2  Language- name 2 objects 2 2  Language- repeat 1 1  Language- follow 3 step command 3 3  Language- read & follow direction 1 1  Write a sentence 1 1  Copy design 1 1  Total score 29 28     Generalized: Well developed, in no acute distress   Neurological examination  Mentation: Alert oriented to time, place, history taking. Follows all commands speech and language fluent Cranial nerve II-XII: Pupils were equal round reactive to light. Extraocular movements were full, visual field were full on confrontational test. Facial sensation and strength were normal. Uvula tongue midline. Head turning and shoulder shrug  were normal and symmetric. Motor: The motor testing reveals 5 over 5 strength of all 4 extremities. Good symmetric motor tone is noted throughout.  Sensory: Sensory testing is intact to soft touch on all 4 extremities. No evidence of extinction is noted.  Coordination: Cerebellar testing reveals good finger-nose-finger and heel-to-shin bilaterally.  Gait and station: Gait is normal. Tandem gait is normal. Romberg is negative. No drift is seen.  Reflexes: Deep tendon reflexes are symmetric and normal bilaterally.   DIAGNOSTIC DATA (LABS, IMAGING, TESTING) - I reviewed patient records, labs, notes, testing and imaging myself where available.  Lab Results  Component Value Date   WBC 9.5 03/27/2015   HGB 11.0* 03/27/2015   HCT 34.4* 03/27/2015   MCV 88.9 03/27/2015   PLT 181 03/27/2015      Component Value Date/Time   NA 140 03/27/2015 0132   K 3.7 03/27/2015 0132   CL 104 03/27/2015 0132   CO2 23 03/27/2015 0132   GLUCOSE 106* 03/27/2015 0132   BUN 12 03/27/2015 0132   CREATININE 0.88 03/27/2015 0132    CALCIUM 8.8* 03/27/2015 0132   PROT 7.2 11/12/2009 1118   ALBUMIN 4.1 11/12/2009 1118   AST 64* 11/12/2009 1118   ALT 50* 11/12/2009 1118   ALKPHOS 115 11/12/2009 1118   BILITOT 0.7 11/12/2009 1118   GFRNONAA >60 03/27/2015 0132   GFRAA >60 03/27/2015 0132      ASSESSMENT AND PLAN 75 y.o. year old female  has a past medical history of High blood pressure; Neuropathy (HCC); Anxiety; Fibromyalgia; GERD (gastroesophageal reflux disease); IBS (irritable bowel syndrome); HSV (herpes simplex virus) anogenital infection; Restless leg syndrome; Sleep apnea; Memory loss; Osteoarthritis; Overactive bladder; and Obesity (BMI 30-39.9) (04/01/2014). here with:  1. Mild memory impairment 2. Paresthesias bilateral feet  Overall the patient's memory has remained stable. Her MMSE today is 30/30. We will continue to monitor her memory. The patient will remain on gabapentin. I have advised the patient that if her symptoms worsen or she develops any new symptoms she should let us know. She will follow-up in 6 months or sooner if needed.     Butch Penny, MSN, NP-C 08/12/2015, 9:47 AM [facility] [address], [address] [place], [place] [address] [phone]

## 2015-08-12 NOTE — Patient Instructions (Signed)
We will continue to monitor memory Continue Gabapentin for neuropathy If your symptoms worsen or you develop new symptoms please let us know.

## 2016-01-07 NOTE — Progress Notes (Signed)
Cardiology Office Note    Date:  01/08/2016   ID:  Lynn Fosterlaine C Bey, DOB 1941/01/11, MRN [id number]  PCP:  Thora LanceEHINGER,ROBERT R, MD  Cardiologist:  Armanda Magicraci Jamarr Treinen, MD   Chief Complaint  Patient presents with  . Sleep Apnea  . Hypertension    History of Present Illness:  Lynn Price is a 75 y.o. female with a history of OSA and HTN who presents today for followup. When I last saw her she had sent her machine back in because she did not like it.  She wanted to get a new machine so we ordered another sleep studywhich showed mild to moderate OSA with an AHI of 14.6/hr and was titrated to 5cm H2O.  She is now back for followup.  She is not tolerating the nasal pillow mask.  The mask is leaking a lot.  She is not using a chin strap.  She says that it comes off of her head every night.  She has been back to see [facility] to work with her.  It did ok for a while but then after a few days it started coming off.  She does not sleep on her back.      Past Medical History:  Diagnosis Date  . Anxiety   . Fibromyalgia   . GERD (gastroesophageal reflux disease)   . High blood pressure   . HSV (herpes simplex virus) anogenital infection   . IBS (irritable bowel syndrome)   . Memory loss   . Neuropathy (HCC)   . Obesity (BMI 30-39.9) 04/01/2014  . Osteoarthritis   . Overactive bladder   . Restless leg syndrome   . Sleep apnea     Past Surgical History:  Procedure Laterality Date  . APPENDECTOMY    . CATARACT EXTRACTION Left   . CESAREAN SECTION    . CHOLECYSTECTOMY    . colonscopy    . MULTIPLE TOOTH EXTRACTIONS    . ROTATOR CUFF REPAIR Left   . TOTAL KNEE ARTHROPLASTY      Current Medications: Outpatient Medications Prior to Visit  Medication Sig Dispense Refill  . clobetasol (TEMOVATE) 0.05 % external solution     . gabapentin (NEURONTIN) 300 MG capsule Take 600-900 mg by mouth 2 (two) times daily. 2 tab in am 3 tab in pm    . LORazepam (ATIVAN) 0.5 MG tablet Take 1 mg by mouth 2  (two) times daily.     . Omeprazole (HM OMEPRAZOLE) 20 MG TBEC Take 20 mg by mouth 2 (two) times daily.     Marland Kitchen. triamcinolone (KENALOG) 0.025 % cream Apply topically 2 (two) times daily. Reported on 08/12/2015    . valACYclovir (VALTREX) 500 MG tablet Take 500 mg by mouth 2 (two) times daily.     Marland Kitchen. venlafaxine XR (EFFEXOR XR) 150 MG 24 hr capsule Take 150 mg by mouth daily.    Marland Kitchen. diltiazem (CARDIZEM CD) 180 MG 24 hr capsule Take 1 capsule by mouth  daily for blood pressure (Patient not taking: Reported on 01/08/2016) 90 capsule 3  . donepezil (ARICEPT) 10 MG tablet Take 10 mg by mouth at bedtime.    Marland Kitchen. estradiol (ESTRACE) 0.1 MG/GM vaginal cream Place 1 Applicatorful vaginally as needed.    . predniSONE (DELTASONE) 20 MG tablet 3 tabs po daily x 3 days, then 2 tabs x 3 days, then 1.5 tabs x 3 days, then 1 tab x 3 days, then 0.5 tabs x 3 days (Patient not taking: Reported on 01/08/2016) 27  tablet 0   No facility-administered medications prior to visit.      Allergies:   Erythromycin; Penicillins; Sulfa antibiotics; and Tetracyclines & related   Social History   Social History  . Marital status: Married    Spouse name: Dimas Aguas  . Number of children: 2  . Years of education: college   Occupational History  .      retired   Social History Main Topics  . Smoking status: Never Smoker  . Smokeless tobacco: Never Used  . Alcohol use 0.0 oz/week     Comment: wine occas  . Drug use: No  . Sexual activity: Not Asked   Other Topics Concern  . None   Social History Narrative   Patient lives at home with her husband Dimas Aguas) and she is retired. Patient has 2 years of college. Patient has 2 children.      Family History:  The patient's family history includes Depression in her father and mother.   ROS:   Please see the history of present illness.    ROS All other systems reviewed and are negative.  No flowsheet data found.     PHYSICAL EXAM:   VS:  BP 128/72   Pulse 92   Ht 5\' 5"   (1.651 m)   Wt 137 lb 12.8 oz (62.5 kg)   SpO2 96%   BMI 22.93 kg/m    GEN: Well nourished, well developed, in no acute distress  HEENT: normal  Neck: no JVD, carotid bruits, or masses Cardiac: RRR; no murmurs, rubs, or gallops,no edema.  Intact distal pulses bilaterally.  Respiratory:  clear to auscultation bilaterally, normal work of breathing GI: soft, nontender, nondistended, + BS MS: no deformity or atrophy  Skin: warm and dry, no rash Neuro:  Alert and Oriented x 3, Strength and sensation are intact Psych: euthymic mood, full affect  Wt Readings from Last 3 Encounters:  01/08/16 137 lb 12.8 oz (62.5 kg)  08/12/15 137 lb 6.4 oz (62.3 kg)  03/27/15 132 lb (59.9 kg)      Studies/Labs Reviewed:   EKG:  EKG is not ordered today.    Recent Labs: 03/27/2015: BUN 12; Creatinine, Ser 0.88; Hemoglobin 11.0; Platelets 181; Potassium 3.7; Sodium 140   Lipid Panel No results found for: CHOL, TRIG, HDL, CHOLHDL, VLDL, LDLCALC, LDLDIRECT  Additional studies/ records that were reviewed today include:  CPAP download    ASSESSMENT:    1. Obstructive sleep apnea   2. Essential hypertension, benign   3. Obesity (BMI 30-39.9)      PLAN:  In order of problems listed above:  OSA - the patient is tolerating PAP therapy but having issues with keeping the mask on. The PAP download was reviewed today and showed an AHI of 32.5/hr on 5 cm H2O with 30% compliance in using more than 4 hours nightly.  I suspect that some of the problem with AHI being too high is due to mask leak from the nasal pillow mask not fitting well.  I am going to send her back to [facility] for refit.  HTN - BP controlled on current meds. Obesity - I have encouraged him to get into a routine exercise program and cut back on carbs and portions.     Medication Adjustments/Labs and Tests Ordered: Current medicines are reviewed at length with the patient today.  Concerns regarding medicines are outlined above.  Medication  changes, Labs and Tests ordered today are listed in the Patient Instructions below.  There are  no Patient Instructions on file for this visit.   Signed, Armanda Magicraci Daichi Moris, MD  01/08/2016 10:00 AM    [facility] [address], [place], [place]  [address] Phone: [phone]; Fax: [phone]

## 2016-01-08 ENCOUNTER — Ambulatory Visit (INDEPENDENT_AMBULATORY_CARE_PROVIDER_SITE_OTHER): Payer: Medicare Other | Admitting: Cardiology

## 2016-01-08 ENCOUNTER — Other Ambulatory Visit: Payer: Self-pay | Admitting: *Deleted

## 2016-01-08 ENCOUNTER — Encounter: Payer: Self-pay | Admitting: Cardiology

## 2016-01-08 ENCOUNTER — Telehealth: Payer: Self-pay

## 2016-01-08 ENCOUNTER — Encounter (INDEPENDENT_AMBULATORY_CARE_PROVIDER_SITE_OTHER): Payer: Self-pay

## 2016-01-08 VITALS — BP 128/72 | HR 92 | Ht 65.0 in | Wt 137.8 lb

## 2016-01-08 DIAGNOSIS — I10 Essential (primary) hypertension: Secondary | ICD-10-CM

## 2016-01-08 DIAGNOSIS — E669 Obesity, unspecified: Secondary | ICD-10-CM

## 2016-01-08 DIAGNOSIS — G4733 Obstructive sleep apnea (adult) (pediatric): Secondary | ICD-10-CM | POA: Diagnosis not present

## 2016-01-08 NOTE — Telephone Encounter (Signed)
Orders placed and [facility] notified

## 2016-01-08 NOTE — Telephone Encounter (Signed)
[facility] Instructed her to schedule patient for appointment for mask fitting with chin strap.  Gunnar Fusiaula states she thinks Dr. Mayford Knifeurner should order an auto-titration. She states currently the patient is set at 5 cm H2O and her AHI is too high.

## 2016-01-08 NOTE — Patient Instructions (Signed)
Medication Instructions:  Your physician recommends that you continue on your current medications as directed. Please refer to the Current Medication list given to you today.   Labwork: None  Testing/Procedures: None  Follow-Up: Your physician wants you to follow-up in: 6 months with Dr. Mayford Knifeurner. You will receive a reminder letter in the mail two months in advance. If you don't receive a letter, please call our office to schedule the follow-up appointment.   Any Other Special Instructions Will Be Listed Below (If Applicable). We have ordered for you a chin strap. We will call you a couple weeks after you get your strap to give you an update.    If you need a refill on your cardiac medications before your next appointment, please call your pharmacy.

## 2016-01-08 NOTE — Telephone Encounter (Signed)
Please order chin strap and 2 week autotitration from 4 to 18cm H2o after patient gets chin strap

## 2016-01-08 NOTE — Addendum Note (Signed)
Addended by: Gunnar FusiKEMP, Chaquita Basques A on: 01/08/2016 12:17 PM   Modules accepted: Orders

## 2016-01-14 ENCOUNTER — Telehealth: Payer: Self-pay | Admitting: *Deleted

## 2016-01-14 NOTE — Telephone Encounter (Signed)
Spoke to patient to see if she received her chin strap and mask fitting and she did today. I tried to set up her two week follow up but Dr. Mayford Knifeurner doesn't have any availability.Will speak to the sleep nurse on Friday

## 2016-01-14 NOTE — Telephone Encounter (Signed)
-----   Message from Reesa Cheworothea G Alizea Pell, CMA sent at 01/08/2016 12:12 PM EST ----- Regarding: chin strap Call on Tuesday to make sure she got her chin strap

## 2016-01-22 DIAGNOSIS — M797 Fibromyalgia: Secondary | ICD-10-CM | POA: Insufficient documentation

## 2016-01-22 DIAGNOSIS — M19042 Primary osteoarthritis, left hand: Secondary | ICD-10-CM | POA: Insufficient documentation

## 2016-01-22 DIAGNOSIS — M19041 Primary osteoarthritis, right hand: Secondary | ICD-10-CM | POA: Insufficient documentation

## 2016-01-22 DIAGNOSIS — F5101 Primary insomnia: Secondary | ICD-10-CM | POA: Insufficient documentation

## 2016-01-22 DIAGNOSIS — M19072 Primary osteoarthritis, left ankle and foot: Secondary | ICD-10-CM

## 2016-01-22 DIAGNOSIS — M170 Bilateral primary osteoarthritis of knee: Secondary | ICD-10-CM | POA: Insufficient documentation

## 2016-01-22 DIAGNOSIS — M19071 Primary osteoarthritis, right ankle and foot: Secondary | ICD-10-CM | POA: Insufficient documentation

## 2016-01-22 DIAGNOSIS — M1120 Other chondrocalcinosis, unspecified site: Secondary | ICD-10-CM | POA: Insufficient documentation

## 2016-01-26 ENCOUNTER — Encounter: Payer: Self-pay | Admitting: Rheumatology

## 2016-01-26 ENCOUNTER — Ambulatory Visit (INDEPENDENT_AMBULATORY_CARE_PROVIDER_SITE_OTHER): Payer: Medicare Other | Admitting: Rheumatology

## 2016-01-26 VITALS — BP 103/65 | HR 85 | Resp 16 | Ht 65.0 in | Wt 139.0 lb

## 2016-01-26 DIAGNOSIS — M19041 Primary osteoarthritis, right hand: Secondary | ICD-10-CM | POA: Diagnosis not present

## 2016-01-26 DIAGNOSIS — M19042 Primary osteoarthritis, left hand: Secondary | ICD-10-CM

## 2016-01-26 DIAGNOSIS — M19071 Primary osteoarthritis, right ankle and foot: Secondary | ICD-10-CM

## 2016-01-26 DIAGNOSIS — G4733 Obstructive sleep apnea (adult) (pediatric): Secondary | ICD-10-CM

## 2016-01-26 DIAGNOSIS — M797 Fibromyalgia: Secondary | ICD-10-CM | POA: Diagnosis not present

## 2016-01-26 DIAGNOSIS — Z87898 Personal history of other specified conditions: Secondary | ICD-10-CM | POA: Diagnosis not present

## 2016-01-26 DIAGNOSIS — M1120 Other chondrocalcinosis, unspecified site: Secondary | ICD-10-CM | POA: Diagnosis not present

## 2016-01-26 DIAGNOSIS — E669 Obesity, unspecified: Secondary | ICD-10-CM | POA: Diagnosis not present

## 2016-01-26 DIAGNOSIS — E559 Vitamin D deficiency, unspecified: Secondary | ICD-10-CM | POA: Diagnosis not present

## 2016-01-26 DIAGNOSIS — F5101 Primary insomnia: Secondary | ICD-10-CM

## 2016-01-26 DIAGNOSIS — I10 Essential (primary) hypertension: Secondary | ICD-10-CM

## 2016-01-26 DIAGNOSIS — M170 Bilateral primary osteoarthritis of knee: Secondary | ICD-10-CM | POA: Diagnosis not present

## 2016-01-26 DIAGNOSIS — M19072 Primary osteoarthritis, left ankle and foot: Secondary | ICD-10-CM

## 2016-01-26 MED ORDER — GABAPENTIN 300 MG PO CAPS
ORAL_CAPSULE | ORAL | 5 refills | Status: DC
Start: 2016-01-26 — End: 2016-05-02

## 2016-01-26 NOTE — Progress Notes (Addendum)
Office Visit Note  [name]             Date of Birth: 02-28-1941           MRN: [id number]             PCP: Thora LanceEHINGER,ROBERT R, MD Referring: Blair HeysEhinger, Robert, MD Visit Date: 01/26/2016 Occupation: @GUAROCC @    Subjective:  Left foot pain   History of Present Illness: Lynn Price is a 75 y.o. female with history of fibromyalgia syndrome osteoarthritis and pseudogout. She denies any flare of pseudogout. She states her left foot especially the left first and second MTP joint and occasionally the ankle joint hurts off and on. She describes pain is usually sharp and last only for a few seconds. She has not noticed any joint swelling. Her left CMC joint is a still painful she is not using the brace. She went to physical therapy after her last visit but had to discontinue the she was getting dental implants and it was difficult to fit in her schedule. She's also been experiencing some left arm pain which is started. Some discomfort in the left total knee replacement some discomfort from fibromyalgia. Her insomnia is better on CPAP.  Activities of Daily Living:  Patient reports morning stiffness for 30 minutes.   Patient Denies nocturnal pain.  Difficulty dressing/grooming: Denies Difficulty climbing stairs: Denies Difficulty getting out of chair: Reports Difficulty using hands for taps, buttons, cutlery, and/or writing: Reports   Review of Systems  Constitutional: Negative for fatigue, night sweats, weight gain, weight loss and weakness.  HENT: Positive for mouth dryness. Negative for mouth sores, trouble swallowing, trouble swallowing and nose dryness.   Eyes: Negative for pain, redness, visual disturbance and dryness.  Respiratory: Negative for cough, shortness of breath and difficulty breathing.   Cardiovascular: Negative for chest pain, palpitations, hypertension, irregular heartbeat and swelling in legs/feet.  Gastrointestinal: Negative for blood in stool,  constipation and diarrhea.  Endocrine: Negative for increased urination.  Genitourinary: Negative for vaginal dryness.  Musculoskeletal: Positive for arthralgias, joint pain, myalgias, morning stiffness and myalgias. Negative for joint swelling, muscle weakness and muscle tenderness.  Skin: Negative for color change, rash, hair loss, skin tightness, ulcers and sensitivity to sunlight.  Allergic/Immunologic: Negative for susceptible to infections.  Neurological: Negative for dizziness, memory loss and night sweats.  Hematological: Negative for swollen glands.  Psychiatric/Behavioral: Negative for depressed mood and sleep disturbance. The patient is not nervous/anxious.     PMFS History:  Patient Active Problem List   Diagnosis Date Noted  . Pseudogout 01/22/2016  . Primary osteoarthritis of both knees 01/22/2016  . Primary osteoarthritis of both hands 01/22/2016  . Primary osteoarthritis of both feet 01/22/2016  . Fibromyalgia 01/22/2016  . Primary insomnia 01/22/2016  . Obesity (BMI 30-39.9) 04/01/2014  . Obstructive sleep apnea 04/19/2013  . Essential hypertension, benign 04/19/2013  . Memory loss 08/02/2012    Past Medical History:  Diagnosis Date  . Anxiety   . Fibromyalgia   . GERD (gastroesophageal reflux disease)   . High blood pressure   . HSV (herpes simplex virus) anogenital infection   . IBS (irritable bowel syndrome)   . Memory loss   . Neuropathy (HCC)   . Obesity (BMI 30-39.9) 04/01/2014  . Osteoarthritis   . Overactive bladder   . Restless leg syndrome   . Sleep apnea     Family History  Problem Relation Age of Onset  . Depression Mother   . Depression  Father   . Mental illness Sister   . Mental illness Sister    Past Surgical History:  Procedure Laterality Date  . APPENDECTOMY    . CATARACT EXTRACTION Left   . CESAREAN SECTION    . CHOLECYSTECTOMY    . colonscopy    . MULTIPLE TOOTH EXTRACTIONS    . ROTATOR CUFF REPAIR Left   . TOTAL KNEE  ARTHROPLASTY     Social History   Social History Narrative   Patient lives at home with her husband Dimas Aguas) and she is retired. Patient has 2 years of college. Patient has 2 children.      Objective: Vital Signs: BP 103/65 (BP Location: Left Arm, Patient Position: Sitting, Cuff Size: Large)   Pulse 85   Resp 16   Ht 5\' 5"  (1.651 m)   Wt 139 lb (63 kg)   BMI 23.13 kg/m    Physical Exam  Constitutional: She is oriented to person, place, and time. She appears well-developed and well-nourished.  HENT:  Head: Normocephalic and atraumatic.  Eyes: Conjunctivae and EOM are normal.  Neck: Normal range of motion.  Cardiovascular: Normal rate, regular rhythm, normal heart sounds and intact distal pulses.   Pulmonary/Chest: Effort normal and breath sounds normal.  Abdominal: Soft. Bowel sounds are normal.  Lymphadenopathy:    She has no cervical adenopathy.  Neurological: She is alert and oriented to person, place, and time.  Skin: Skin is warm and dry. Capillary refill takes less than 2 seconds.  Psychiatric: She has a normal mood and affect. Her behavior is normal.  Nursing note and vitals reviewed.    Musculoskeletal Exam: C-spine and thoracic lumbar spine good range of motion she has good range of motion of her shoulders all the joints wrist joints she has thickening of bilateral CMC joints PIP/DIP joints in her hands consistent with osteoarthritis with no synovitis her left total knee replacement appears to be doing well she is thickening of PIP/DIP joints in her feet consistent with osteoarthritis with no synovitis. Fibromyalgia tender points with 12 out of 18 positive.  CDAI Exam: No CDAI exam completed.    Investigation: Findings:  The exercise scan done on 07/22/2015 showed BMD 0.809 and T score -0.4 which is lowest in the left femoral neck there was -3% change was no significant.    Imaging: No results found.  Speciality Comments: No specialty comments  available.    Procedures:  No procedures performed Allergies: Erythromycin; Penicillins; Sulfa antibiotics; and Tetracyclines & related   Assessment / Plan:     Visit Diagnoses: Pseudogout: No recent flare  Primary osteoarthritis of both knees - Left total knee replacement: She continues to have some discomfort  Primary osteoarthritis of both hands: Joint protection and muscle strengthening was discussed.  Primary osteoarthritis of both feet: Proper fitting shoes were discussed.  Fibromyalgia: She continues to have some generalized pain and discomfort. She requests refills for gabapentin I will reduce her gabapentin dose to 300 mg during daytime and 600 mg at bedtime. Side effects of gabapentin was discussed.   Bone density showed T score of -0.4. Calcium and vitamin D and resistive exercise was discussed.  Primary insomnia: She is on medications and is doing better since she's using CPAP.  She has other medical problems listed as follows for which she's been seen by PCP.  Obstructive sleep apnea  Obesity (BMI 30-39.9)  Essential hypertension, benign  Vitamin D deficiency  History of urinary retention - History of recurrent UTIs, requires self-catheterization  Orders: No orders of the defined types were placed in this encounter.  No orders of the defined types were placed in this encounter.   Face-to-face time spent with patient was 30 minutes. 50% of time was spent in counseling and coordination of care.  Follow-Up Instructions: Return in about 6 months (around 07/25/2016) for Osteoarthritis.   Pollyann SavoyShaili Jontavius Rabalais, MD

## 2016-02-04 ENCOUNTER — Encounter: Payer: Self-pay | Admitting: Cardiology

## 2016-02-11 ENCOUNTER — Encounter: Payer: Self-pay | Admitting: Adult Health

## 2016-02-11 ENCOUNTER — Ambulatory Visit (INDEPENDENT_AMBULATORY_CARE_PROVIDER_SITE_OTHER): Payer: Medicare Other | Admitting: Adult Health

## 2016-02-11 VITALS — BP 112/60 | HR 78 | Resp 14 | Ht 65.0 in | Wt 139.0 lb

## 2016-02-11 DIAGNOSIS — R413 Other amnesia: Secondary | ICD-10-CM

## 2016-02-11 DIAGNOSIS — G609 Hereditary and idiopathic neuropathy, unspecified: Secondary | ICD-10-CM | POA: Diagnosis not present

## 2016-02-11 NOTE — Progress Notes (Signed)
I have reviewed and agreed above plan. 

## 2016-02-11 NOTE — Patient Instructions (Signed)
We Will continue monitor memory If your symptoms worsen or you develop new symptoms please let us know.

## 2016-02-11 NOTE — Progress Notes (Signed)
[name] DOB: Aug 09, 1940  REASON FOR VISIT: follow up HISTORY FROM: patient  HISTORY OF PRESENT ILLNESS:  HISTORY Lynn Price(YAN): Lynn Price, 75 year old female was referred by her primary care physician Dr. Blair Heysobert Ehinger for evaluation of memory trouble. She was last seen in June 2015 by Eber Jonesarolyn for memory loss  She has a history of fibromyalgia hypertension, anxiety, obstructive sleep apnea using CPAP and short-term memory trouble. She also has a history of bilateral feet paresthesias. 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 She denies significant family history of dementia   MRI of the brain 07/11/2011 shows mild changes of chronic microvascular ischemia and generalized cerebral atrophy.   The patient continues to drive and has gotten lost in unfamiliar surroundings. She continues to be independent with her activities of daily living. She plays golf intermittently otherwise she gets no regular exercise. She has not had any falls, long-standing history of frequency incontinence and on oxybutynin. Her Mini-Mental Status exam is stable. She returns for reevaluation  UPDATE June 6th 2016. She is with her husband at today's clinical visit, she continue has mild slow worsening memory trouble, she continued to drive short distances in a familiar route, she visit her friends, go to church regularly, able to clean house, independent at daily activity,  She also complains of bilateral feet numbness tingling, slow worsening, she is taking gabapentin 300 mg 2 tablets in the morning 3 tablets every night, does make her feel tired and sleepy,  She has not used her CPAP machine for a while, tends to go to bed late, take long nap in the afternoon,  She also complains of being fatigued, lack of energy  UPDATE Feb 03 2015: She is with her husband at today's clinical visit, complains of depression, Aricept continue causing mild  upset stomach, decreased appetite, mild worsening memory trouble, she also noticed her husband has been has mild memory trouble.  Update 08/12/2015: Lynn Price is a 75 year old female with a history of mild memory impairment and neuropathy. She returns today for follow-up. She states that her memory has remained the same. She is able to complete all ADLs independently. She operates a Librarian, academicmotor vehicle without difficulty. She prepares meals at home. Her husband is retired and he has now taken over the finances. She denies having to give up anything due to her memory. She is no longer on Aricept nor Namenda. She could not tolerate these medications. She continues to have paresthesias in the feet. She uses gabapentin with good benefit. Denies any changes in her gait or balance. She does not use an assistive device when ambulating. Denies any falls. She returns today for an evaluation.  Today 02/11/2016: Lynn Price is a 75 year old female with a history of memory disturbance and neuropathy. She returns today for follow-up. The patient states that her memory has remained stable. She is able to complete all ADLs independently. She operates a Librarian, academicmotor vehicle without difficulty. She continues to prepare meals. Her husband does all the finances. She is not on any medical patient for her memory at this time. She continues to use gabapentin for neuropathy. She denies any falls. Her husband states that she tends to move slower and overall complete tasks slower than she did before. States that she sleeps well at night. Denies any hallucinations. Denies tremor. She denies any new issues. Returns today for an evaluation.    REVIEW OF SYSTEMS: Out of a complete 14 system review  of symptoms, the patient complains only of the following symptoms, and all other reviewed systems are negative.  Constipation, incontinence of bladder, memory loss, dizziness, numbness, depression, nervous/anxious, back pain, joint pain, itching, apnea,  snoring, chest pain, drooling, chills, fatigue, eye discharge, cold intolerance  ALLERGIES: Allergies  Allergen Reactions  . Erythromycin Rash  . Penicillins Rash  . Sulfa Antibiotics Rash  . Tetracyclines & Related Rash    HOME MEDICATIONS: Outpatient Medications Prior to Visit  Medication Sig Dispense Refill  . clobetasol (TEMOVATE) 0.05 % external solution     . diltiazem (CARDIZEM CD) 120 MG 24 hr capsule Take 120 mg by mouth daily.    . fluticasone (FLONASE) 50 MCG/ACT nasal spray Place into both nostrils daily.    Marland Kitchen gabapentin (NEURONTIN) 300 MG capsule 1 tab in am 2 tab in pm 90 capsule 5  . LORazepam (ATIVAN) 0.5 MG tablet Take 1 mg by mouth 2 (two) times daily.     . Omeprazole (HM OMEPRAZOLE) 20 MG TBEC Take 20 mg by mouth 2 (two) times daily.     Marland Kitchen triamcinolone (KENALOG) 0.025 % cream Apply topically 2 (two) times daily. Reported on 08/12/2015    . valACYclovir (VALTREX) 500 MG tablet Take 500 mg by mouth 2 (two) times daily.     Marland Kitchen venlafaxine XR (EFFEXOR XR) 150 MG 24 hr capsule Take 150 mg by mouth daily.     No facility-administered medications prior to visit.     PAST MEDICAL HISTORY: Past Medical History:  Diagnosis Date  . Anxiety   . Fibromyalgia   . GERD (gastroesophageal reflux disease)   . High blood pressure   . HSV (herpes simplex virus) anogenital infection   . IBS (irritable bowel syndrome)   . Memory loss   . Neuropathy (HCC)   . Obesity (BMI 30-39.9) 04/01/2014  . Osteoarthritis   . Overactive bladder   . Restless leg syndrome   . Sleep apnea     PAST SURGICAL HISTORY: Past Surgical History:  Procedure Laterality Date  . APPENDECTOMY    . CATARACT EXTRACTION Left   . CESAREAN SECTION    . CHOLECYSTECTOMY    . colonscopy    . MULTIPLE TOOTH EXTRACTIONS    . ROTATOR CUFF REPAIR Left   . TOTAL KNEE ARTHROPLASTY      FAMILY HISTORY: Family History  Problem Relation Age of Onset  . Depression Mother   . Depression Father   . Mental  illness Sister   . Mental illness Sister     SOCIAL HISTORY: Social History   Social History  . Marital status: Married    Spouse name: Dimas Aguas  . Number of children: 2  . Years of education: college   Occupational History  .      retired   Social History Main Topics  . Smoking status: Never Smoker  . Smokeless tobacco: Never Used  . Alcohol use 0.0 oz/week     Comment: wine occas  . Drug use: No  . Sexual activity: Not on file   Other Topics Concern  . Not on file   Social History Narrative   Patient lives at home with her husband Dimas Aguas) and she is retired. Patient has 2 years of college. Patient has 2 children.       PHYSICAL EXAM  Vitals:   02/11/16 1117  BP: 112/60  Pulse: 78  Resp: 14  Weight: 139 lb (63 kg)  Height: 5\' 5"  (1.651 m)   Body mass  index is 23.13 kg/m. MMSE - Mini Mental State Exam 02/11/2016 02/03/2015 08/04/2014  Orientation to time 5 4 4   Orientation to Place 5 5 5   Registration 3 3 3   Attention/ Calculation 4 5 5   Recall 2 3 2   Language- name 2 objects 2 2 2   Language- repeat 1 1 1   Language- follow 3 step command 3 3 3   Language- read & follow direction 1 1 1   Write a sentence 1 1 1   Copy design 1 1 1   Total score 28 29 28     Generalized: Well developed, in no acute distress   Neurological examination  Mentation: Alert oriented to time, place, history taking. Follows all commands speech and language fluent Cranial nerve II-XII: Pupils were equal round reactive to light. Extraocular movements were full, visual field were full on confrontational test. Facial sensation and strength were normal. Uvula tongue midline. Head turning and shoulder shrug  were normal and symmetric. Motor: The motor testing reveals 5 over 5 strength of all 4 extremities. Good symmetric motor tone is noted throughout.  Sensory: Sensory testing is intact to soft touch on all 4 extremities. No evidence of extinction is noted.  Coordination: Cerebellar testing  reveals good finger-nose-finger and heel-to-shin bilaterally.  Gait and station: Gait is normal. Tandem gait is normal. Romberg is negative. No drift is seen.  Reflexes: Deep tendon reflexes are symmetric and normal bilaterally.   DIAGNOSTIC DATA (LABS, IMAGING, TESTING) - I reviewed patient records, labs, notes, testing and imaging myself where available.  Lab Results  Component Value Date   WBC 9.5 03/27/2015   HGB 11.0 (L) 03/27/2015   HCT 34.4 (L) 03/27/2015   MCV 88.9 03/27/2015   PLT 181 03/27/2015      Component Value Date/Time   NA 140 03/27/2015 0132   K 3.7 03/27/2015 0132   CL 104 03/27/2015 0132   CO2 23 03/27/2015 0132   GLUCOSE 106 (H) 03/27/2015 0132   BUN 12 03/27/2015 0132   CREATININE 0.88 03/27/2015 0132   CALCIUM 8.8 (L) 03/27/2015 0132   PROT 7.2 11/12/2009 1118   ALBUMIN 4.1 11/12/2009 1118   AST 64 (H) 11/12/2009 1118   ALT 50 (H) 11/12/2009 1118   ALKPHOS 115 11/12/2009 1118   BILITOT 0.7 11/12/2009 1118   GFRNONAA >60 03/27/2015 0132   GFRAA >60 03/27/2015 0132      ASSESSMENT AND PLAN 75 y.o. year old female  has a past medical history of Anxiety; Fibromyalgia; GERD (gastroesophageal reflux disease); High blood pressure; HSV (herpes simplex virus) anogenital infection; IBS (irritable bowel syndrome); Memory loss; Neuropathy (HCC); Obesity (BMI 30-39.9) (04/01/2014); Osteoarthritis; Overactive bladder; Restless leg syndrome; and Sleep apnea. here with:  1. Memory disturbance 2. Neuropathy  Overall the patient's memory score has remained stable. She is currently not on any medication for her memory. We will continue to monitor. She is on gabapentin for neuropathy. Advised patient that if her symptoms worsen or she develops any new symptoms she should let us know. Follow-up in 6 months or sooner if needed.     Butch PennyMegan Bradyn Vassey, MSN, NP-C 02/11/2016, 12:05 PM [facility] [address], [address] [place], [place] [address] [phone]  [phone]

## 2016-05-02 ENCOUNTER — Telehealth: Payer: Self-pay | Admitting: Rheumatology

## 2016-05-02 ENCOUNTER — Encounter: Payer: Self-pay | Admitting: Rheumatology

## 2016-05-02 MED ORDER — GABAPENTIN 300 MG PO CAPS: ORAL_CAPSULE | ORAL | 5 refills | Status: DC

## 2016-05-02 NOTE — Telephone Encounter (Signed)
Patient called and stated that Dr. Corliss Skainseveshwar took her off or reduced her gabapentin.  Ever since then she has been having pains all over her body.  Wants to know if she can start taking that again.  [phone]

## 2016-05-02 NOTE — Telephone Encounter (Signed)
Ok to go to previous dose

## 2016-05-02 NOTE — Telephone Encounter (Signed)
Patient advised to return to previous dose and prescription sent to pharmacy.

## 2016-05-02 NOTE — Telephone Encounter (Signed)
See note

## 2016-05-02 NOTE — Addendum Note (Signed)
Addended by: Henriette CombsHATTON, Harold Moncus L on: 05/02/2016 03:28 PM   Modules accepted: Orders

## 2016-05-02 NOTE — Telephone Encounter (Signed)
Patient states she has places all over her body that are hurting. Patient states her left arm from her elbow to her shoulder is hurting and feels like a burning sensation. Patient states she is having pain in her feet. Patient states she is also having pain in her right second and third fingers. Patient staets she is also having pain in her lower back. Patient is requesting to increase her dose of gabapentin back to the dose she was originally on. Patient states she has decreased her dose the pain started.

## 2016-07-11 NOTE — Progress Notes (Signed)
Cardiology Office Note    Date:  07/12/2016   ID:  Lynn Price, DOB 1940-04-26, MRN [id number]  PCP:  Blair Heys, MD  Cardiologist:  Armanda Magic, MD   Chief Complaint  Patient presents with  . Sleep Apnea  . Hypertension    History of Present Illness:  Lynn Price is a 76 y.o. female with a history of OSA and HTN. She has a history of mild to moderate OSA with an AHI of 14.6/hr and had been on CPAP at 5cm H2O.  She is now back for followup.  She is having a lot of problems with her nasal mask.  She says that it is too big for her head and it is the smallest mask that [facility] has.  She says that it leaks and falls off her head.  She has tried the nasal pillow mask in the past which was loose as well.  She was last at [facility] about 2-3 months ago and was given her current mask which worked good for a few nights and then started having problems.  She says that if her husband helps get her mask on at night it works well but if she has to do it then the mask leaks.  She says another reason she has not used it is because she has had some dental work done and her mouth was sore. She had some implants done but she has recovered from that now.  Past Medical History:  Diagnosis Date  . Anxiety   . Fibromyalgia   . GERD (gastroesophageal reflux disease)   . High blood pressure   . HSV (herpes simplex virus) anogenital infection   . IBS (irritable bowel syndrome)   . Memory loss   . Neuropathy   . Obesity (BMI 30-39.9) 04/01/2014  . Osteoarthritis   . Overactive bladder   . Restless leg syndrome   . Sleep apnea     Past Surgical History:  Procedure Laterality Date  . APPENDECTOMY    . CATARACT EXTRACTION Left   . CESAREAN SECTION    . CHOLECYSTECTOMY    . colonscopy    . MULTIPLE TOOTH EXTRACTIONS    . ROTATOR CUFF REPAIR Left   . TOTAL KNEE ARTHROPLASTY      Current Medications: Current Meds  Medication Sig  . clobetasol (TEMOVATE) 0.05 % external  solution   . diltiazem (CARDIZEM CD) 120 MG 24 hr capsule Take 120 mg by mouth daily.  . fluticasone (FLONASE) 50 MCG/ACT nasal spray Place into both nostrils daily.  Marland Kitchen gabapentin (NEURONTIN) 300 MG capsule 2 tabs in am 3 tabs in pm  . LORazepam (ATIVAN) 0.5 MG tablet Take 1 mg by mouth 2 (two) times daily.   . Omeprazole (HM OMEPRAZOLE) 20 MG TBEC Take 20 mg by mouth 2 (two) times daily.   Marland Kitchen triamcinolone (KENALOG) 0.025 % cream Apply topically 2 (two) times daily. Reported on 08/12/2015  . valACYclovir (VALTREX) 500 MG tablet Take 500 mg by mouth 2 (two) times daily.   Marland Kitchen venlafaxine XR (EFFEXOR XR) 150 MG 24 hr capsule Take 150 mg by mouth daily.    Allergies:   Erythromycin; Penicillins; Sulfa antibiotics; and Tetracyclines & related   Social History   Social History  . Marital status: Married    Spouse name: Lynn Price  . Number of children: 2  . Years of education: college   Occupational History  .      retired   Social History  Main Topics  . Smoking status: Never Smoker  . Smokeless tobacco: Never Used  . Alcohol use 0.0 oz/week     Comment: wine occas  . Drug use: No  . Sexual activity: Not Asked   Other Topics Concern  . None   Social History Narrative   Patient lives at home with her husband Lynn Price([place]) and she is retired. Patient has 2 years of college. Patient has 2 children.      Family History:  The patient's family history includes Depression in her father and mother; Mental illness in her sister and sister.   ROS:   Please see the history of present illness.    ROS All other systems reviewed and are negative.  No flowsheet data found.     PHYSICAL EXAM:   VS:  BP 138/70   Pulse 83   Ht 5\' 5"  (1.651 m)   Wt 134 lb 12.8 oz (61.1 kg)   SpO2 99%   BMI 22.43 kg/m    GEN: Well nourished, well developed, in no acute distress  HEENT: normal  Neck: no JVD, carotid bruits, or masses Cardiac: RRR; no murmurs, rubs, or gallops,no edema.  Intact distal pulses  bilaterally.  Respiratory:  clear to auscultation bilaterally, normal work of breathing GI: soft, nontender, nondistended, + BS MS: no deformity or atrophy  Skin: warm and dry, no rash Neuro:  Alert and Oriented x 3, Strength and sensation are intact Psych: euthymic mood, full affect  Wt Readings from Last 3 Encounters:  07/12/16 134 lb 12.8 oz (61.1 kg)  02/11/16 139 lb (63 kg)  01/26/16 139 lb (63 kg)      Studies/Labs Reviewed:   EKG:  EKG is not ordered today.   Recent Labs: No results found for requested labs within last 8760 hours.   Lipid Panel No results found for: CHOL, TRIG, HDL, CHOLHDL, VLDL, LDLCALC, LDLDIRECT  Additional studies/ records that were reviewed today include:  CPAP download    ASSESSMENT:    1. Obstructive sleep apnea   2. Essential hypertension, benign   3. Obesity (BMI 30-39.9)      PLAN:  In order of problems listed above:  OSA - she has not been using her CPAP device in quite a while due to mask fit problems.  I have encouraged her to make another app[facility] to see if there are any other mask options. If not then I have encouraged her husband to put her mask on her prior to him going to sleep nightly.  I will get a download in 4 week.  HTN - her BP is adequately controlled today on exam.  She will continue on Cardizem. Obesity - I have encouraged him to get into a routine exercise program and cut back on carbs and portions.    Medication Adjustments/Labs and Tests Ordered: Current medicines are reviewed at length with the patient today.  Concerns regarding medicines are outlined above.  Medication changes, Labs and Tests ordered today are listed in the Patient Instructions below.  There are no Patient Instructions on file for this visit.   Signed, Armanda Magicraci Roosevelt Eimers, MD  07/12/2016 10:11 AM    [facility] [address], [place], [place]  [address] Phone: [phone]; Fax: [phone]

## 2016-07-12 ENCOUNTER — Telehealth: Payer: Self-pay | Admitting: *Deleted

## 2016-07-12 ENCOUNTER — Ambulatory Visit (INDEPENDENT_AMBULATORY_CARE_PROVIDER_SITE_OTHER): Payer: Medicare Other | Admitting: Cardiology

## 2016-07-12 ENCOUNTER — Encounter (INDEPENDENT_AMBULATORY_CARE_PROVIDER_SITE_OTHER): Payer: Self-pay

## 2016-07-12 ENCOUNTER — Encounter: Payer: Self-pay | Admitting: Cardiology

## 2016-07-12 VITALS — BP 138/70 | HR 83 | Ht 65.0 in | Wt 134.8 lb

## 2016-07-12 DIAGNOSIS — E669 Obesity, unspecified: Secondary | ICD-10-CM | POA: Diagnosis not present

## 2016-07-12 DIAGNOSIS — I10 Essential (primary) hypertension: Secondary | ICD-10-CM

## 2016-07-12 DIAGNOSIS — G4733 Obstructive sleep apnea (adult) (pediatric): Secondary | ICD-10-CM

## 2016-07-12 NOTE — Patient Instructions (Signed)
Medication Instructions:  Your physician recommends that you continue on your current medications as directed. Please refer to the Current Medication list given to you today.   Labwork: None  Testing/Procedures: None  Follow-Up: Your physician wants you to follow-up in: 1 year with Dr. Mayford Knifeurner. You will receive a reminder letter in the mail two months in advance. If you don't receive a letter, please call our office to schedule the follow-up appointment.   Any Other Special Instructions Will Be Listed Below (If Applicable). We will get a download in about a month and call you with results.    If you need a refill on your cardiac medications before your next appointment, please call your pharmacy.

## 2016-07-12 NOTE — Telephone Encounter (Addendum)
4 week down load-- around 08/15/16

## 2016-07-12 NOTE — Telephone Encounter (Signed)
-----   Message from Henrietta DineKathryn A Kemp, RN sent at 07/12/2016  1:08 PM EDT ----- Regarding: download Please pull a download in 4 weeks  Thanks!

## 2016-07-22 DIAGNOSIS — Z96651 Presence of right artificial knee joint: Secondary | ICD-10-CM | POA: Insufficient documentation

## 2016-07-22 NOTE — Progress Notes (Signed)
Office Visit Note  [name]             Date of Birth: [birth year]           MRN: [id number]             PCP: Blair Heys, MD Referring: Blair Heys, MD Visit Date: 08/03/2016 Occupation: @GUAROCC @    Subjective:  Pain left elbow and left shoulder   History of Present Illness: Lynn Price is a 76 y.o. female with history of osteoarthritis pseudogout and fibromyalgia syndrome. She was accompanied by her husband today. She states she's doing better overall as regards to fibromyalgia. She tried to taper down on gabapentin but had to increase it back due to increased pain. She states she fell this morning while she was getting  off the commode. She states she fell backwards and hit her left arm against the bathtub. She has some discomfort that gets better now. She has no bruising. She continues to have some discomfort in her feet. She's not had any flare of pseudogout. She does have some anxiety and insomnia.  Activities of Daily Living:  Patient reports morning stiffness for 5 minutes.   Patient Reports nocturnal pain.  Difficulty dressing/grooming: Reports Difficulty climbing stairs: Reports Difficulty getting out of chair: Reports Difficulty using hands for taps, buttons, cutlery, and/or writing: Denies   Review of Systems  Constitutional: Positive for fatigue. Negative for night sweats, weight gain, weight loss and weakness.  HENT: Positive for mouth dryness. Negative for mouth sores, trouble swallowing, trouble swallowing and nose dryness.   Eyes: Positive for dryness. Negative for pain, redness and visual disturbance.  Respiratory: Negative for cough, shortness of breath and difficulty breathing.   Cardiovascular: Negative for chest pain, palpitations, hypertension, irregular heartbeat and swelling in legs/feet.  Gastrointestinal: Positive for constipation. Negative for blood in stool and diarrhea.  Endocrine: Negative for increased urination.    Genitourinary: Negative for vaginal dryness.  Musculoskeletal: Positive for arthralgias, joint pain and morning stiffness. Negative for joint swelling, myalgias, muscle weakness, muscle tenderness and myalgias.  Skin: Negative for color change, rash, hair loss, skin tightness, ulcers and sensitivity to sunlight.  Allergic/Immunologic: Negative for susceptible to infections.  Neurological: Negative for dizziness, memory loss and night sweats.  Hematological: Negative for swollen glands.  Psychiatric/Behavioral: Positive for depressed mood. Negative for sleep disturbance. The patient is not nervous/anxious.     PMFS History:  Patient Active Problem List   Diagnosis Date Noted  . Personal history of calcium pyrophosphate deposition disease (CPPD) 07/29/2016  . History of peripheral neuropathy 07/29/2016  . History of total knee replacement, right 07/22/2016  . Pseudogout 01/22/2016  . Primary osteoarthritis of both knees 01/22/2016  . Primary osteoarthritis of both hands 01/22/2016  . Primary osteoarthritis of both feet 01/22/2016  . Fibromyalgia 01/22/2016  . Primary insomnia 01/22/2016  . Obesity (BMI 30-39.9) 04/01/2014  . Obstructive sleep apnea 04/19/2013  . Essential hypertension, benign 04/19/2013  . Memory loss 08/02/2012    Past Medical History:  Diagnosis Date  . Anxiety   . Fibromyalgia   . GERD (gastroesophageal reflux disease)   . High blood pressure   . HSV (herpes simplex virus) anogenital infection   . IBS (irritable bowel syndrome)   . Memory loss   . Neuropathy   . Obesity (BMI 30-39.9) 04/01/2014  . Osteoarthritis   . Overactive bladder   . Restless leg syndrome   . Sleep apnea     Family History  Problem Relation Age of Onset  . Depression Mother   . Depression Father   . Mental illness Sister   . Mental illness Sister    Past Surgical History:  Procedure Laterality Date  . APPENDECTOMY    . CATARACT EXTRACTION Left   . CESAREAN SECTION    .  CHOLECYSTECTOMY    . colonscopy    . MULTIPLE TOOTH EXTRACTIONS    . ROTATOR CUFF REPAIR Left   . TOTAL KNEE ARTHROPLASTY     Social History   Social History Narrative   [name] and she is retired. Patient has 2 years of college. Patient has 2 children.      Objective: Vital Signs: BP 118/72   Pulse 78   Resp 14   Ht 5\' 5"  (1.651 m)   Wt 140 lb (63.5 kg)   BMI 23.30 kg/m    Physical Exam  Constitutional: She is oriented to person, place, and time. She appears well-developed and well-nourished.  HENT:  Head: Normocephalic and atraumatic.  Eyes: Conjunctivae and EOM are normal.  Neck: Normal range of motion.  Cardiovascular: Normal rate, regular rhythm, normal heart sounds and intact distal pulses.   Pulmonary/Chest: Effort normal and breath sounds normal.  Abdominal: Soft. Bowel sounds are normal.  Lymphadenopathy:    She has no cervical adenopathy.  Neurological: She is alert and oriented to person, place, and time.  Skin: Skin is warm and dry. Capillary refill takes less than 2 seconds.  Psychiatric: She has a normal mood and affect. Her behavior is normal.  Nursing note and vitals reviewed.    Musculoskeletal Exam: C-spine good range of motion. She is some thoracic kyphosis. Some discomfort range of motion of her lumbar spine. Shoulder joints elbow joints wrist joint MCPs PIPs DIPs with good range of motion. She is thickening of PIP/DIP joints in her hands consistent with osteoarthritis. She is good range of motion of her hip joints. Her left total knee replacement is doing well. She is some osteoarthritic changes in her feet. No synovitis was noted. Fibromyalgia tender points were 6 out of 18 positive.  CDAI Exam: No CDAI exam completed.    Investigation: Findings:  DEXA 07/22/2015 showed BMD 0.809 and T score -0.4 which is lowest in the left femoral neck there was -3% change was no significant.    Imaging: No results  found.  Speciality Comments: No specialty comments available.    Procedures:  No procedures performed Allergies: Erythromycin; Penicillins; Sulfa antibiotics; and Tetracyclines & related   Assessment / Plan:     Visit Diagnoses: Pseudogout: She has not had any flares.  Primary osteoarthritis of both hands: Joint protection and muscle strengthening discussed.:  Primary osteoarthritis of right knee: She has some discomfort with climbing the stairs.  History of total knee replacement, left: Doing well  Primary osteoarthritis of both feet: Proper fitting shoes were discussed.  Fibromyalgia she continues to have some discomfort from fibromyalgia. The gabapentin reduces her pain symptoms. Although probably increases her risk of falling. We had detailed discussion regarding that she has recent fall. Have advised her to taper her independent gradually and she states she tolerates that.  Recent fall: I will refer her to physical therapy for lower extremity muscle strengthening and fall prevention.   Primary insomnia: Good sleep hygiene was discussed.  Her other medical problems are listed as follows.  Obesity (BMI 30-39.9)  History of memory loss  History of sleep apnea  Vitamin  D deficiency  History of anxiety  History of seborrheic dermatitis  History of urinary retention with recurrent UTIs  History of peripheral neuropathy  Gait disturbance - Plan: Ambulatory referral to Physical Therapy    Orders: Orders Placed This Encounter  Procedures  . Ambulatory referral to Physical Therapy   No orders of the defined types were placed in this encounter.   Face-to-face time spent with patient was 30 minutes. 50% of time was spent in counseling and coordination of care.  Follow-Up Instructions: Return in about 6 months (around 02/02/2017) for Osteoarthritis,FMS.   Pollyann Savoy, MD  Note - This record has been created using Animal nutritionist.  Chart creation errors  have been sought, but may not always  have been located. Such creation errors do not reflect on  the standard of medical care.

## 2016-07-29 DIAGNOSIS — Z8669 Personal history of other diseases of the nervous system and sense organs: Secondary | ICD-10-CM | POA: Insufficient documentation

## 2016-07-29 DIAGNOSIS — Z8739 Personal history of other diseases of the musculoskeletal system and connective tissue: Secondary | ICD-10-CM | POA: Insufficient documentation

## 2016-08-03 ENCOUNTER — Encounter: Payer: Self-pay | Admitting: Rheumatology

## 2016-08-03 ENCOUNTER — Ambulatory Visit (INDEPENDENT_AMBULATORY_CARE_PROVIDER_SITE_OTHER): Payer: Medicare Other | Admitting: Rheumatology

## 2016-08-03 VITALS — BP 118/72 | HR 78 | Resp 14 | Ht 65.0 in | Wt 140.0 lb

## 2016-08-03 DIAGNOSIS — R269 Unspecified abnormalities of gait and mobility: Secondary | ICD-10-CM

## 2016-08-03 DIAGNOSIS — M19041 Primary osteoarthritis, right hand: Secondary | ICD-10-CM

## 2016-08-03 DIAGNOSIS — Z8659 Personal history of other mental and behavioral disorders: Secondary | ICD-10-CM | POA: Diagnosis not present

## 2016-08-03 DIAGNOSIS — Z87898 Personal history of other specified conditions: Secondary | ICD-10-CM | POA: Diagnosis not present

## 2016-08-03 DIAGNOSIS — F5101 Primary insomnia: Secondary | ICD-10-CM | POA: Diagnosis not present

## 2016-08-03 DIAGNOSIS — E559 Vitamin D deficiency, unspecified: Secondary | ICD-10-CM

## 2016-08-03 DIAGNOSIS — M19071 Primary osteoarthritis, right ankle and foot: Secondary | ICD-10-CM

## 2016-08-03 DIAGNOSIS — M1711 Unilateral primary osteoarthritis, right knee: Secondary | ICD-10-CM | POA: Diagnosis not present

## 2016-08-03 DIAGNOSIS — E669 Obesity, unspecified: Secondary | ICD-10-CM

## 2016-08-03 DIAGNOSIS — M19072 Primary osteoarthritis, left ankle and foot: Secondary | ICD-10-CM

## 2016-08-03 DIAGNOSIS — Z8669 Personal history of other diseases of the nervous system and sense organs: Secondary | ICD-10-CM

## 2016-08-03 DIAGNOSIS — M797 Fibromyalgia: Secondary | ICD-10-CM

## 2016-08-03 DIAGNOSIS — M1120 Other chondrocalcinosis, unspecified site: Secondary | ICD-10-CM

## 2016-08-03 DIAGNOSIS — Z872 Personal history of diseases of the skin and subcutaneous tissue: Secondary | ICD-10-CM

## 2016-08-03 DIAGNOSIS — Z96652 Presence of left artificial knee joint: Secondary | ICD-10-CM | POA: Diagnosis not present

## 2016-08-03 DIAGNOSIS — M19042 Primary osteoarthritis, left hand: Secondary | ICD-10-CM

## 2016-08-10 ENCOUNTER — Encounter: Payer: Self-pay | Admitting: Adult Health

## 2016-08-10 ENCOUNTER — Telehealth: Payer: Self-pay | Admitting: Adult Health

## 2016-08-10 ENCOUNTER — Ambulatory Visit (INDEPENDENT_AMBULATORY_CARE_PROVIDER_SITE_OTHER): Payer: Medicare Other | Admitting: Adult Health

## 2016-08-10 VITALS — BP 99/62 | HR 79 | Ht 65.0 in | Wt 134.8 lb

## 2016-08-10 DIAGNOSIS — R413 Other amnesia: Secondary | ICD-10-CM

## 2016-08-10 DIAGNOSIS — G629 Polyneuropathy, unspecified: Secondary | ICD-10-CM

## 2016-08-10 MED ORDER — GABAPENTIN 300 MG PO CAPS: 600.0000 mg | ORAL_CAPSULE | Freq: Two times a day (BID) | ORAL | 5 refills | Status: DC

## 2016-08-10 NOTE — Telephone Encounter (Signed)
Ativan is on patients medication list.

## 2016-08-10 NOTE — Telephone Encounter (Signed)
Patient was seen by Aundra MilletMegan today and is calling to advise she takes generic Ativan 1 in the morning and 1 at night. She has been taking for several years.

## 2016-08-10 NOTE — Patient Instructions (Addendum)
Memory score is slightly declined Check to see if you are taking Aricept? Continue gabapentin for neuropathy If your symptoms worsen or you develop new symptoms please let us know.

## 2016-08-10 NOTE — Progress Notes (Signed)
[name] DOB: [birth year]  REASON FOR VISIT: follow up- memory, neuropathy HISTORY FROM: patient  HISTORY OF PRESENT ILLNESS: HISTORY Terrace Arabia): Lynn Price, 76 year old female was referred by her primary care physician Dr. Blair Heys for evaluation of memory trouble. She was last seen in June 2015 by [name] for memory loss  She has a history of fibromyalgia hypertension, anxiety, obstructive sleep apnea using CPAP and short-term memory trouble. She also has a history of bilateral feet paresthesias. 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 She denies significant family history of dementia   MRI of the brain 07/11/2011 shows mild changes of chronic microvascular ischemia and generalized cerebral atrophy.   The patient continues to drive and has gotten lost in unfamiliar surroundings. She continues to be independent with her activities of daily living. She plays golf intermittently otherwise she gets no regular exercise. She has not had any falls, long-standing history of frequency incontinence and on oxybutynin. Her Mini-Mental Status exam is stable. She returns for reevaluation  UPDATE June 6th 2016. She is with her husband at today's clinical visit, she continue has mild slow worsening memory trouble, she continued to drive short distances in a familiar route, she visit her friends, go to church regularly, able to clean house, independent at daily activity,  She also complains of bilateral feet numbness tingling, slow worsening, she is taking gabapentin 300 mg 2 tablets in the morning 3 tablets every night, does make her feel tired and sleepy,  She has not used her CPAP machine for a while, tends to go to bed late, take long nap in the afternoon,  She also complains of being fatigued, lack of energy  UPDATE Feb 03 2015: She is with her husband at today's clinical visit, complains of depression, Aricept  continue causing mild upset stomach, decreased appetite, mild worsening memory trouble, she also noticed her husband has been has mild memory trouble.  Update 08/12/2015: Ms. Lynn Price is a 76 year old female with a history of mild memory impairment and neuropathy. She returns today for follow-up. She states that her memory has remained the same. She is able to complete all ADLs independently. She operates a Librarian, academic without difficulty. She prepares meals at home. Her husband is retired and he has now taken over the finances. She denies having to give up anything due to her memory. She is no longer on Aricept nor Namenda. She could not tolerate these medications. She continues to have paresthesias in the feet. She uses gabapentin with good benefit. Denies any changes in her gait or balance. She does not use an assistive device when ambulating. Denies any falls. She returns today for an evaluation.   02/11/2016: Ms. Lynn Price is a 76 year old female with a history of memory disturbance and neuropathy. She returns today for follow-up. The patient states that her memory has remained stable. She is able to complete all ADLs independently. She operates a Librarian, academic without difficulty. She continues to prepare meals. Her husband does all the finances. She is not on any medical patient for her memory at this time. She continues to use gabapentin for neuropathy. She denies any falls. Her husband states that she tends to move slower and overall complete tasks slower than she did before. States that she sleeps well at night. Denies any hallucinations. Denies tremor. She denies any new issues. Returns today for an evaluation.  Today 08/10/16:  Ms. Lynn Price Is a 76 year old female with a  history of memory disturbance and neuropathy. She returns today for follow-up. She reports that she has continued on gabapentin.  She reports that this was decreased to 600 mg twice a day. She did initially notice some discomfort after  they decreased the dose but that has since resolved.She feels that this works well for her. She denies any significant discomfort. Denies any changes with her gait or balance. Denies any falls. She feels that her memory has remained  Stable. However husband thinks it may have gotten worse.. She is not currently on any medication for her memory. She lives at home with her husband. She is able to operate a motor vehicle without difficulty. She prepares all meals without difficulty. Denies any trouble sleeping. She does not feel that she's had to give up anything due to her memory.  Her husband states that approximately 2 months ago she began telling him that there is a mouse in the house. The patient states that she has never seen a mouse but she can hear it. This only occurs at night. The patient's husband state that he has never heard this. He states that a year or so ago they did have a mouse in the house but that has since resolved. The husband is concerned that this might be a type of hallucination. Denies any other type of hallucinations. She returns to evaluation.   REVIEW OF SYSTEMS: Out of a complete 14 system review of symptoms, the patient complains only of the following symptoms, and all other reviewed systems are negative.   I discharge, eye itching, double vision, blurred vision, shortness of breath, chest tightness, chest swelling, leg swelling, runny nose, trouble swallowing, drooling, fatigue, unexpected weight change, cold intolerance, excessive thirst, constipation, nausea, rectal pain, incontinence of bowels, daytime sleepiness, rash, moles, itching, neck pain, walking difficulty, aching muscles, back pain, joint pain, incontinence of bladder, difficulty urinating, bruise/bleed easily, memory loss, dizziness, ness, agitation, confusion, depression, nervous/anxious,    ALLERGIES: Allergies  Allergen Reactions  . Erythromycin Rash  . Penicillins Rash  . Sulfa Antibiotics Rash  .  Tetracyclines & Related Rash    HOME MEDICATIONS: Outpatient Medications Prior to Visit  Medication Sig Dispense Refill  . clobetasol (TEMOVATE) 0.05 % external solution     . diltiazem (CARDIZEM CD) 120 MG 24 hr capsule Take 120 mg by mouth daily.    . fluticasone (FLONASE) 50 MCG/ACT nasal spray Place into both nostrils daily.    Marland Kitchen gabapentin (NEURONTIN) 300 MG capsule 2 tabs in am 3 tabs in pm 150 capsule 5  . LORazepam (ATIVAN) 0.5 MG tablet Take 1 mg by mouth 2 (two) times daily.     . Omeprazole (HM OMEPRAZOLE) 20 MG TBEC Take 20 mg by mouth 2 (two) times daily.     Marland Kitchen triamcinolone (KENALOG) 0.025 % cream Apply topically 2 (two) times daily. Reported on 08/12/2015    . valACYclovir (VALTREX) 500 MG tablet Take 500 mg by mouth 2 (two) times daily.     Marland Kitchen venlafaxine XR (EFFEXOR XR) 150 MG 24 hr capsule Take 150 mg by mouth daily.     No facility-administered medications prior to visit.     PAST MEDICAL HISTORY: Past Medical History:  Diagnosis Date  . Anxiety   . Fibromyalgia   . GERD (gastroesophageal reflux disease)   . High blood pressure   . HSV (herpes simplex virus) anogenital infection   . IBS (irritable bowel syndrome)   . Memory loss   . Neuropathy   .  Obesity (BMI 30-39.9) 04/01/2014  . Osteoarthritis   . Overactive bladder   . Restless leg syndrome   . Sleep apnea     PAST SURGICAL HISTORY: Past Surgical History:  Procedure Laterality Date  . APPENDECTOMY    . CATARACT EXTRACTION Left   . CESAREAN SECTION    . CHOLECYSTECTOMY    . colonscopy    . MULTIPLE TOOTH EXTRACTIONS    . ROTATOR CUFF REPAIR Left   . TOTAL KNEE ARTHROPLASTY      FAMILY HISTORY: Family History  Problem Relation Age of Onset  . Depression Mother   . Depression Father   . Mental illness Sister   . Mental illness Sister     SOCIAL HISTORY: Social History   Social History  . Marital status: Married    Spouse name: Dimas Aguas  . Number of children: 2  . Years of education:  college   Occupational History  .      retired   Social History Main Topics  . Smoking status: Never Smoker  . Smokeless tobacco: Never Used  . Alcohol use 0.0 oz/week     Comment: wine occas  . Drug use: No  . Sexual activity: Not on file   Other Topics Concern  . Not on file   Social History Narrative   Patient lives at home with her husband Dimas Aguas) and she is retired. Patient has 2 years of college. Patient has 2 children.       PHYSICAL EXAM  Vitals:   08/10/16 1116  BP: 99/62  Pulse: 79  Weight: 134 lb 12.8 oz (61.1 kg)  Height: 5\' 5"  (1.651 m)   Body mass index is 22.43 kg/m.   MMSE - Mini Mental State Exam 08/10/2016 02/11/2016 02/03/2015  Orientation to time 4 5 4   Orientation to Place 5 5 5   Registration 3 3 3   Attention/ Calculation 0 4 5  Recall 2 2 3   Language- name 2 objects 2 2 2   Language- repeat 1 1 1   Language- follow 3 step command 3 3 3   Language- read & follow direction 1 1 1   Write a sentence 1 1 1   Copy design 1 1 1   Total score 23 28 29       Generalized: Well developed, in no acute distress   Neurological examination  Mentation: Alert . Follows all commands speech and language fluent Cranial nerve II-XII: Pupils were equal round reactive to light. Extraocular movements were full, visual field were full on confrontational test. Facial sensation and strength were normal. Uvula tongue midline. Head turning and shoulder shrug  were normal and symmetric. Motor: The motor testing reveals 5 over 5 strength of all 4 extremities. Good symmetric motor tone is noted throughout.  Sensory: Sensory testing is intact to soft touch on all 4 extremities. No evidence of extinction is noted.  Coordination: Cerebellar testing reveals good finger-nose-finger and heel-to-shin bilaterally.  Gait and station: Gait is normal. Tandem gait is normal. Romberg is negative. No drift is seen.  Reflexes: Deep tendon reflexes are symmetric and normal bilaterally.    DIAGNOSTIC DATA (LABS, IMAGING, TESTING) - I reviewed patient records, labs, notes, testing and imaging myself where available.  Lab Results  Component Value Date   WBC 9.5 03/27/2015   HGB 11.0 (L) 03/27/2015   HCT 34.4 (L) 03/27/2015   MCV 88.9 03/27/2015   PLT 181 03/27/2015      Component Value Date/Time   NA 140 03/27/2015 0132   K 3.7  03/27/2015 0132   CL 104 03/27/2015 0132   CO2 23 03/27/2015 0132   GLUCOSE 106 (H) 03/27/2015 0132   BUN 12 03/27/2015 0132   CREATININE 0.88 03/27/2015 0132   CALCIUM 8.8 (L) 03/27/2015 0132   PROT 7.2 11/12/2009 1118   ALBUMIN 4.1 11/12/2009 1118   AST 64 (H) 11/12/2009 1118   ALT 50 (H) 11/12/2009 1118   ALKPHOS 115 11/12/2009 1118   BILITOT 0.7 11/12/2009 1118   GFRNONAA >60 03/27/2015 0132   GFRAA >60 03/27/2015 0132      ASSESSMENT AND PLAN 76 y.o. year old female  has a past medical history of Anxiety; Fibromyalgia; GERD (gastroesophageal reflux disease); High blood pressure; HSV (herpes simplex virus) anogenital infection; IBS (irritable bowel syndrome); Memory loss; Neuropathy; Obesity (BMI 30-39.9) (04/01/2014); Osteoarthritis; Overactive bladder; Restless leg syndrome; and Sleep apnea. here with:  1. Memory disturbance 2. Neuropathy   The patient's memory score has declined. The patient is unsure if she is taking Aricept. Reports this may have been prescribed by her primary care provider over a year ago. They will check at home and call me back to let me know. She will continue gabapentin for neuropathy. She is advised that if her symptoms worsen or she develops new symptoms she should let us know. She will follow-up in 6 months with Dr. Terrace ArabiaYan.    Butch PennyMegan Eeshan Verbrugge, MSN, NP-C 08/10/2016, 10:09 AM [facility] [address], [address] [place], [place] [phone]

## 2016-08-12 NOTE — Progress Notes (Signed)
I have reviewed and agreed above plan. 

## 2016-08-18 ENCOUNTER — Telehealth: Payer: Self-pay | Admitting: Adult Health

## 2016-08-18 NOTE — Telephone Encounter (Signed)
Patients husband office in reference to wife hearing and seeing things at the home.  Please call

## 2016-08-18 NOTE — Telephone Encounter (Signed)
Spoke to husband.  He let us know that did have exterminator come out and see about mice.  Did have some findings of mice, but was thought to be old.  Did have precautions taken (service contract) to check on this periodically.  Pt still feels (in her mind) that she is seeing and hearing mice.  He wanted to let you know and if suggestions to let him know either way.  I would relay message and then call him back.

## 2016-08-22 NOTE — Telephone Encounter (Signed)
Try noise machine at night. I don't think medication is necessary at this time. If this worsens let us know or if she has any other hallucinations

## 2016-08-22 NOTE — Telephone Encounter (Signed)
Spoke to husband and he will give it a try.   They are going to beach next week, and will see how she does with ocean.  He states she does not have problem usually when away from home.  Will call back as needed.

## 2016-09-29 ENCOUNTER — Telehealth: Payer: Self-pay | Admitting: *Deleted

## 2016-09-29 NOTE — Telephone Encounter (Signed)
-----   Message from Quintella Reichertraci R Turner, MD sent at 09/23/2016 12:55 PM EDT ----- Patient does not appear to be using her machine,  Please find out if her husband is helping her getting her mask on

## 2016-09-29 NOTE — Telephone Encounter (Signed)
Patient called to say she had 2 eye surgeries in the last 4 weeks and this is why she has not been using her machine.  Her husband states she will start back using her machine as soon as possible.

## 2016-10-06 NOTE — Telephone Encounter (Signed)
Patient called in stating Per her doctors order she can not wear her CPAP mask until her eye has healed. Patient states had a retina detachment surgery.  Patient was concerned about being considered noncompliant. Patient was advised to call her DME [facility] to make them aware.

## 2017-01-18 ENCOUNTER — Other Ambulatory Visit: Payer: Self-pay | Admitting: *Deleted

## 2017-01-18 MED ORDER — GABAPENTIN 300 MG PO CAPS: 600.0000 mg | ORAL_CAPSULE | Freq: Two times a day (BID) | ORAL | 5 refills | Status: DC

## 2017-01-18 NOTE — Telephone Encounter (Signed)
Refill request received via fax  Last Visit: 08/03/16 Next Visit: 02/02/17  Okay to refill per Dr. Corliss Skainseveshwar

## 2017-01-20 NOTE — Progress Notes (Deleted)
Office Visit Note  [name]             Date of Birth: May 13, 1940           MRN: [id number]             PCP: Blair HeysEhinger, Robert, MD Referring: Blair HeysEhinger, Robert, MD Visit Date: 02/02/2017 Occupation: @GUAROCC @    Subjective:  No chief complaint on file.   History of Present Illness: Lynn Price is a 76 y.o. female ***   Activities of Daily Living:  Patient reports morning stiffness for *** {minute/hour:19697}.   Patient {ACTIONS;DENIES/REPORTS:21021675::"Denies"} nocturnal pain.  Difficulty dressing/grooming: {ACTIONS;DENIES/REPORTS:21021675::"Denies"} Difficulty climbing stairs: {ACTIONS;DENIES/REPORTS:21021675::"Denies"} Difficulty getting out of chair: {ACTIONS;DENIES/REPORTS:21021675::"Denies"} Difficulty using hands for taps, buttons, cutlery, and/or writing: {ACTIONS;DENIES/REPORTS:21021675::"Denies"}   No Rheumatology ROS completed.   PMFS History:  Patient Active Problem List   Diagnosis Date Noted  . Personal history of calcium pyrophosphate deposition disease (CPPD) 07/29/2016  . History of peripheral neuropathy 07/29/2016  . History of total knee replacement, right 07/22/2016  . Pseudogout 01/22/2016  . Primary osteoarthritis of both knees 01/22/2016  . Primary osteoarthritis of both hands 01/22/2016  . Primary osteoarthritis of both feet 01/22/2016  . Fibromyalgia 01/22/2016  . Primary insomnia 01/22/2016  . Obesity (BMI 30-39.9) 04/01/2014  . Obstructive sleep apnea 04/19/2013  . Essential hypertension, benign 04/19/2013  . Memory loss 08/02/2012    Past Medical History:  Diagnosis Date  . Anxiety   . Fibromyalgia   . GERD (gastroesophageal reflux disease)   . High blood pressure   . HSV (herpes simplex virus) anogenital infection   . IBS (irritable bowel syndrome)   . Memory loss   . Neuropathy   . Obesity (BMI 30-39.9) 04/01/2014  . Osteoarthritis   . Overactive bladder   . Restless leg syndrome   . Sleep apnea     Family  History  Problem Relation Age of Onset  . Depression Mother   . Depression Father   . Mental illness Sister   . Mental illness Sister    Past Surgical History:  Procedure Laterality Date  . APPENDECTOMY    . CATARACT EXTRACTION Left   . CESAREAN SECTION    . CHOLECYSTECTOMY    . colonscopy    . MULTIPLE TOOTH EXTRACTIONS    . ROTATOR CUFF REPAIR Left   . TOTAL KNEE ARTHROPLASTY     Social History   Social History Narrative   [name] and she is retired. Patient has 2 years of college. Patient has 2 children.      Objective: Vital Signs: There were no vitals taken for this visit.   Physical Exam   Musculoskeletal Exam: ***  CDAI Exam: No CDAI exam completed.    Investigation: No additional findings. CBC Latest Ref Rng & Units 03/27/2015 11/20/2009 11/19/2009  WBC 4.0 - 10.5 K/uL 9.5 8.9 8.1  Hemoglobin 12.0 - 15.0 g/dL 11.0(L) 10.7(L) 10.8(L)  Hematocrit 36.0 - 46.0 % 34.4(L) 31.9(L) 32.2(L)  Platelets 150 - 400 K/uL 181 168 123(L)   CMP Latest Ref Rng & Units 03/27/2015 11/20/2009 11/19/2009  Glucose 65 - 99 mg/dL 409(W106(H) 119(J105(H) 478(G144(H)  BUN 6 - 20 mg/dL 12 6 6   Creatinine 0.44 - 1.00 mg/dL 9.560.88 2.130.77 0.860.70  Sodium 135 - 145 mmol/L 140 143 DELTA CHECK NOTED 136  Potassium 3.5 - 5.1 mmol/L 3.7 3.8 3.6  Chloride 101 - 111 mmol/L 104 102 103  CO2 22 - 32  mmol/L 23 31 28   Calcium 8.9 - 10.3 mg/dL 1.6(X8.8(L) 9.1 8.5  Total Protein 6.0 - 8.3 g/dL - - -  Total Bilirubin 0.3 - 1.2 mg/dL - - -  Alkaline Phos 39 - 117 U/L - - -  AST 0 - 37 U/L - - -  ALT 0 - 35 U/L - - -    Imaging: No results found.  Speciality Comments: No specialty comments available.    Procedures:  No procedures performed Allergies: Erythromycin; Penicillins; Sulfa antibiotics; and Tetracyclines & related   Assessment / Plan:     Visit Diagnoses: No diagnosis found.    Orders: No orders of the defined types were placed in this encounter.  No orders of  the defined types were placed in this encounter.   Face-to-face time spent with patient was *** minutes. 50% of time was spent in counseling and coordination of care.  Follow-Up Instructions: No Follow-up on file.   Ellen HenriMarissa C Janayla Marik, CMA  Note - This record has been created using Animal nutritionistDragon software.  Chart creation errors have been sought, but may not always  have been located. Such creation errors do not reflect on  the standard of medical care.

## 2017-02-02 ENCOUNTER — Ambulatory Visit: Payer: Medicare Other | Admitting: Rheumatology

## 2017-02-07 ENCOUNTER — Ambulatory Visit: Payer: Medicare Other | Admitting: Adult Health

## 2017-02-08 ENCOUNTER — Encounter: Payer: Self-pay | Admitting: Adult Health

## 2017-02-08 ENCOUNTER — Ambulatory Visit: Payer: Medicare Other | Admitting: Adult Health

## 2017-02-08 VITALS — BP 133/79 | HR 74 | Ht 65.0 in | Wt 138.0 lb

## 2017-02-08 DIAGNOSIS — R443 Hallucinations, unspecified: Secondary | ICD-10-CM

## 2017-02-08 DIAGNOSIS — R413 Other amnesia: Secondary | ICD-10-CM | POA: Diagnosis not present

## 2017-02-08 NOTE — Progress Notes (Signed)
[name] DOB: 1940-09-01  REASON FOR VISIT: follow up- memory HISTORY FROM: patient  HISTORY OF PRESENT ILLNESS: HISTORY HISTORY Lynn Price(YAN): Lynn Price, 76 year old female was referred by her primary care physician Dr. Blair Heysobert Ehinger for evaluation of memory trouble. She was last seen in June 2015 by Eber Jonesarolyn for memory loss  She has a history of fibromyalgia hypertension, anxiety, obstructive sleep apnea using CPAP and short-term memory trouble. She also has a history of bilateral feet paresthesias. 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 She denies significant family history of dementia   MRI of the brain 07/11/2011 shows mild changes of chronic microvascular ischemia and generalized cerebral atrophy.   The patient continues to drive and has gotten lost in unfamiliar surroundings. She continues to be independent with her activities of daily living. She plays golf intermittently otherwise she gets no regular exercise. She has not had any falls, long-standing history of frequency incontinence and on oxybutynin. Her Mini-Mental Status exam is stable. She returns for reevaluation  UPDATE June 6th 2016. She is with her husband at today's clinical visit, she continue has mild slow worsening memory trouble, she continued to drive short distances in a familiar route, she visit her friends, go to church regularly, able to clean house, independent at daily activity,  She also complains of bilateral feet numbness tingling, slow worsening, she is taking gabapentin 300 mg 2 tablets in the morning 3 tablets every night, does make her feel tired and sleepy,  She has not used her CPAP machine for a while, tends to go to bed late, take long nap in the afternoon,  She also complains of being fatigued, lack of energy  UPDATE Feb 03 2015: She is with her husband at today's clinical visit, complains of depression, Aricept continue  causing mild upset stomach, decreased appetite, mild worsening memory trouble, she also noticed her husband has been has mild memory trouble.  Update 08/12/2015: Lynn Price is a 76 year old female with a history of mild memory impairment and neuropathy. She returns today for follow-up. She states that her memory has remained the same. She is able to complete all ADLs independently. She operates a Librarian, academicmotor vehicle without difficulty. She prepares meals at home. Her husband is retired and he has now taken over the finances. She denies having to give up anything due to her memory. She is no longer on Aricept nor Namenda. She could not tolerate these medications. She continues to have paresthesias in the feet. She uses gabapentin with good benefit. Denies any changes in her gait or balance. She does not use an assistive device when ambulating. Denies any falls. She returns today for an evaluation.   02/11/2016: Lynn Price is a 76 year old female with a history of memory disturbance and neuropathy. She returns today for follow-up. The patient states that her memory has remained stable. She is able to complete all ADLs independently. She operates a Librarian, academicmotor vehicle without difficulty. She continues to prepare meals. Her husband does all the finances. She is not on any medical patient for her memory at this time. She continues to use gabapentin for neuropathy. She denies any falls. Her husband states that she tends to move slower and overall complete tasks slower than she did before. States that she sleeps well at night. Denies any hallucinations. Denies tremor. She denies any new issues. Returns today for an evaluation.   08/10/16:  Lynn Price Is a 76 year old female with a  history of memory disturbance and neuropathy. She returns today for follow-up. She reports that she has continued on gabapentin.  She reports that this was decreased to 600 mg twice a day. She did initially notice some discomfort after they  decreased the dose but that has since resolved.She feels that this works well for her. She denies any significant discomfort. Denies any changes with her gait or balance. Denies any falls. She feels that her memory has remained  Stable. However husband thinks it may have gotten worse.. She is not currently on any medication for her memory. She lives at home with her husband. She is able to operate a motor vehicle without difficulty. She prepares all meals without difficulty. Denies any trouble sleeping. She does not feel that she's had to give up anything due to her memory.  Her husband states that approximately 2 months ago she began telling him that there is a mouse in the house. The patient states that she has never seen a mouse but she can hear it. This only occurs at night. The patient's husband state that he has never heard this. He states that a year or so ago they did have a mouse in the house but that has since resolved. The husband is concerned that this might be a type of hallucination. Denies any other type of hallucinations. She returns to evaluation.  Today 02/08/17  Lynn Price is a 76 year old female with a history of memory disturbance.  She returns today for follow-up.  She is currently not on any medication for her memory.  She reports that she feels that her memory has remained the same.  She is able to complete all ADLs independently.  She denies any trouble sleeping.  Denies any change in her appetite.  She reports that she continues to see mice in her home.  Her husband reports that he has had an exterminator come out and they cannot find any evidence that they have had any mice.  The patient states that she hears them at night and also hears them on the back deck.  She states that she is actually seen the mice in her bathroom but her husband disputes this claim.  She returns today for an evaluation.    REVIEW OF SYSTEMS: Out of a complete 14 system review of symptoms, the patient  complains only of the following symptoms, and all other reviewed systems are negative.  See HPI  ALLERGIES: Allergies  Allergen Reactions  . Erythromycin Rash  . Penicillins Rash  . Sulfa Antibiotics Rash  . Tetracyclines & Related Rash    HOME MEDICATIONS: Outpatient Medications Prior to Visit  Medication Sig Dispense Refill  . clobetasol (TEMOVATE) 0.05 % external solution     . diltiazem (CARDIZEM CD) 120 MG 24 hr capsule Take 120 mg by mouth daily.    . fluticasone (FLONASE) 50 MCG/ACT nasal spray Place into both nostrils daily.    Marland Kitchen. gabapentin (NEURONTIN) 300 MG capsule Take 2 capsules (600 mg total) by mouth 2 (two) times daily. 120 capsule 5  . LORazepam (ATIVAN) 0.5 MG tablet Take 1 mg by mouth 2 (two) times daily.     . Omeprazole (HM OMEPRAZOLE) 20 MG TBEC Take 20 mg by mouth 2 (two) times daily.     Marland Kitchen. triamcinolone (KENALOG) 0.025 % cream Apply topically 2 (two) times daily. Reported on 08/12/2015    . valACYclovir (VALTREX) 500 MG tablet Take 500 mg by mouth 2 (two) times daily.     .Marland Kitchen  venlafaxine XR (EFFEXOR XR) 150 MG 24 hr capsule Take 150 mg by mouth daily.     No facility-administered medications prior to visit.     PAST MEDICAL HISTORY: Past Medical History:  Diagnosis Date  . Anxiety   . Fibromyalgia   . GERD (gastroesophageal reflux disease)   . High blood pressure   . HSV (herpes simplex virus) anogenital infection   . IBS (irritable bowel syndrome)   . Memory loss   . Neuropathy   . Obesity (BMI 30-39.9) 04/01/2014  . Osteoarthritis   . Overactive bladder   . Restless leg syndrome   . Sleep apnea     PAST SURGICAL HISTORY: Past Surgical History:  Procedure Laterality Date  . APPENDECTOMY    . CATARACT EXTRACTION Left   . CESAREAN SECTION    . CHOLECYSTECTOMY    . colonscopy    . MULTIPLE TOOTH EXTRACTIONS    . ROTATOR CUFF REPAIR Left   . TOTAL KNEE ARTHROPLASTY      FAMILY HISTORY: Family History  Problem Relation Age of Onset  .  Depression Mother   . Depression Father   . Mental illness Sister   . Mental illness Sister     SOCIAL HISTORY: Social History   Socioeconomic History  . Marital status: Married    Spouse name: Dimas Aguas  . Number of children: 2  . Years of education: college  . Highest education level: Not on file  Social Needs  . Financial resource strain: Not on file  . Food insecurity - worry: Not on file  . Food insecurity - inability: Not on file  . Transportation needs - medical: Not on file  . Transportation needs - non-medical: Not on file  Occupational History    Comment: retired  Tobacco Use  . Smoking status: Never Smoker  . Smokeless tobacco: Never Used  Substance and Sexual Activity  . Alcohol use: Yes    Alcohol/week: 0.0 oz    Comment: wine occas  . Drug use: No  . Sexual activity: Not on file  Other Topics Concern  . Not on file  Social History Narrative   Patient lives at home with her husband Dimas Aguas) and she is retired. Patient has 2 years of college. Patient has 2 children.       PHYSICAL EXAM  Vitals:   02/08/17 1100  BP: 133/79  Pulse: 74  Weight: 138 lb (62.6 kg)  Height: 5\' 5"  (1.651 m)   Body mass index is 22.96 kg/m.   MMSE - Mini Mental State Exam 02/08/2017 08/10/2016 02/11/2016  Orientation to time 4 4 5   Orientation to Place 5 5 5   Registration 3 3 3   Attention/ Calculation 5 0 4  Attention/Calculation-comments couldnt do numbers - -  Recall 1 2 2   Language- name 2 objects 2 2 2   Language- repeat 1 1 1   Language- follow 3 step command 2 3 3   Language- read & follow direction 1 1 1   Write a sentence 1 1 1   Copy design 1 1 1   Total score 26 23 28      Generalized: Well developed, in no acute distress   Neurological examination  Mentation: Alert oriented to time, place, history taking. Follows all commands speech and language fluent Cranial nerve II-XII: Pupils were equal round reactive to light. Extraocular movements were full, visual  field were full on confrontational test. Facial sensation and strength were normal. Uvula tongue midline. Head turning and shoulder shrug  were normal and  symmetric. Motor: The motor testing reveals 5 over 5 strength of all 4 extremities. Good symmetric motor tone is noted throughout.  Sensory: Sensory testing is intact to soft touch on all 4 extremities. No evidence of extinction is noted.  Coordination: Cerebellar testing reveals good finger-nose-finger and heel-to-shin bilaterally.  Gait and station: Gait is normal. Tandem gait is normal. Romberg is negative. No drift is seen.  Reflexes: Deep tendon reflexes are symmetric and normal bilaterally.   DIAGNOSTIC DATA (LABS, IMAGING, TESTING) - I reviewed patient records, labs, notes, testing and imaging myself where available.  Lab Results  Component Value Date   WBC 9.5 03/27/2015   HGB 11.0 (L) 03/27/2015   HCT 34.4 (L) 03/27/2015   MCV 88.9 03/27/2015   PLT 181 03/27/2015      Component Value Date/Time   NA 140 03/27/2015 0132   K 3.7 03/27/2015 0132   CL 104 03/27/2015 0132   CO2 23 03/27/2015 0132   GLUCOSE 106 (H) 03/27/2015 0132   BUN 12 03/27/2015 0132   CREATININE 0.88 03/27/2015 0132   CALCIUM 8.8 (L) 03/27/2015 0132   PROT 7.2 11/12/2009 1118   ALBUMIN 4.1 11/12/2009 1118   AST 64 (H) 11/12/2009 1118   ALT 50 (H) 11/12/2009 1118   ALKPHOS 115 11/12/2009 1118   BILITOT 0.7 11/12/2009 1118   GFRNONAA >60 03/27/2015 0132   GFRAA >60 03/27/2015 0132      ASSESSMENT AND PLAN 76 y.o. year old female  has a past medical history of Anxiety, Fibromyalgia, GERD (gastroesophageal reflux disease), High blood pressure, HSV (herpes simplex virus) anogenital infection, IBS (irritable bowel syndrome), Memory loss, Neuropathy, Obesity (BMI 30-39.9) (04/01/2014), Osteoarthritis, Overactive bladder, Restless leg syndrome, and Sleep apnea. here with:  1.  Memory disturbance 2. Hallucinations  The patient's memory score has remained  stable.  We discussed potentially adding on memory medication such as Aricept however the patient and her husband would like to wait until the next visit to see if there has been any decline in her memory.  We also discussed medication for hallucinations such as Seroquel.  I did discuss the black box warning for this medication.  At this time they do not want to start any medication but if hallucinations worsen they will let us know.  She will follow-up in 3 months or sooner if needed.   Butch Penny, MSN, NP-C 02/08/2017, 10:53 AM [facility] [address], [address] [place], [place] [address] [phone]

## 2017-02-08 NOTE — Patient Instructions (Signed)
Your Plan:  Continue to  Monitor memory MMSE 26/30 today Consider memory medication such as Aricept If hallucinations continue or worsen can consider seroquel  Thank you for coming to [facility] at [facility]. I hope we have been able to provide you high quality care today.  You may receive a patient satisfaction survey over the next few weeks. We would appreciate your feedback and comments so that we may continue to improve ourselves and the health of our patients.  Quetiapine tablets What is this medicine? QUETIAPINE (kwe TYE a peen) is an antipsychotic. It is used to treat schizophrenia and bipolar disorder, also known as manic-depression. This medicine may be used for other purposes; ask your health care provider or pharmacist if you have questions. COMMON BRAND NAME(S): Seroquel What should I tell my health care provider before I take this medicine? They need to know if you have any of these conditions: -brain tumor or head injury -breast cancer -cataracts -diabetes -difficulty swallowing -heart disease -kidney disease -liver disease -low blood counts, like low white cell, platelet, or red cell counts -low blood pressure or dizziness when standing up -Parkinson's disease -previous heart attack -seizures -suicidal thoughts, plans, or attempt by you or a family member -thyroid disease -an unusual or allergic reaction to quetiapine, other medicines, foods, dyes, or preservatives -pregnant or trying to get pregnant -breast-feeding How should I use this medicine? Take this medicine by mouth. Swallow it with a drink of water. Follow the directions on the prescription label. If it upsets your stomach you can take it with food. Take your medicine at regular intervals. Do not take it more often than directed. Do not stop taking except on the advice of your doctor or health care professional. A special MedGuide will be given to you by the pharmacist with each  prescription and refill. Be sure to read this information carefully each time. Talk to your pediatrician regarding the use of this medicine in children. While this drug may be prescribed for children as young as 10 years for selected conditions, precautions do apply. Patients over age 78 years may have a stronger reaction to this medicine and need smaller doses. Overdosage: If you think you have taken too much of this medicine contact a poison control center or emergency room at once. NOTE: This medicine is only for you. Do not share this medicine with others. What if I miss a dose? If you miss a dose, take it as soon as you can. If it is almost time for your next dose, take only that dose. Do not take double or extra doses. What may interact with this medicine? Do not take this medicine with any of the following medications: -certain medicines for fungal infections like fluconazole, itraconazole, ketoconazole, posaconazole, voriconazole -cisapride -dofetilide -dronedarone -droperidol -grepafloxacin -halofantrine -phenothiazines like chlorpromazine, mesoridazine, thioridazine -pimozide -sparfloxacin -ziprasidone This medicine may also interact with the following medications: -alcohol -antiviral medicines for HIV or AIDS -certain medicines for blood pressure -certain medicines for depression, anxiety, or psychotic disturbances like haloperidol, lorazepam -certain medicines for diabetes -certain medicines for Parkinson's disease -certain medicines for seizures like carbamazepine, phenobarbital, phenytoin -cimetidine -erythromycin -other medicines that prolong the QT interval (cause an abnormal heart rhythm) -rifampin -steroid medicines like prednisone or cortisone This list may not describe all possible interactions. Give your health care provider a list of all the medicines, herbs, non-prescription drugs, or dietary supplements you use. Also tell them if you smoke, drink alcohol, or  use illegal drugs.  Some items may interact with your medicine. What should I watch for while using this medicine? Visit your doctor or health care professional for regular checks on your progress. It may be several weeks before you see the full effects of this medicine. Your health care provider may suggest that you have your eyes examined prior to starting this medicine, and every 6 months thereafter. If you have been taking this medicine regularly for some time, do not suddenly stop taking it. You must gradually reduce the dose or your symptoms may get worse. Ask your doctor or health care professional for advice. Patients and their families should watch out for worsening depression or thoughts of suicide. Also watch out for sudden or severe changes in feelings such as feeling anxious, agitated, panicky, irritable, hostile, aggressive, impulsive, severely restless, overly excited and hyperactive, or not being able to sleep. If this happens, especially at the beginning of antidepressant treatment or after a change in dose, call your health care professional. Bonita Quin may get dizzy or drowsy. Do not drive, use machinery, or do anything that needs mental alertness until you know how this medicine affects you. Do not stand or sit up quickly, especially if you are an older patient. This reduces the risk of dizzy or fainting spells. Alcohol can increase dizziness and drowsiness. Avoid alcoholic drinks. Do not treat yourself for colds, diarrhea or allergies. Ask your doctor or health care professional for advice, some ingredients may increase possible side effects. This medicine can reduce the response of your body to heat or cold. Dress warm in cold weather and stay hydrated in hot weather. If possible, avoid extreme temperatures like saunas, hot tubs, very hot or cold showers, or activities that can cause dehydration such as vigorous exercise. What side effects may I notice from receiving this medicine? Side  effects that you should report to your doctor or health care professional as soon as possible: -allergic reactions like skin rash, itching or hives, swelling of the face, lips, or tongue -difficulty swallowing -fast or irregular heartbeat -fever or chills, sore throat -fever with rash, swollen lymph nodes, or swelling of the face -increased hunger or thirst -increased urination -problems with balance, talking, walking -seizures -stiff muscles -suicidal thoughts or other mood changes -uncontrollable head, mouth, neck, arm, or leg movements -unusually weak or tired Side effects that usually do not require medical attention (report to your doctor or health care professional if they continue or are bothersome): -change in sex drive or performance -constipation -drowsy or dizzy -dry mouth -stomach upset -weight gain This list may not describe all possible side effects. Call your doctor for medical advice about side effects. You may report side effects to FDA at 1-800-FDA-1088. Where should I keep my medicine? Keep out of the reach of children. Store at room temperature between 15 and 30 degrees C (59 and 86 degrees F). Throw away any unused medicine after the expiration date. NOTE: This sheet is a summary. It may not cover all possible information. If you have questions about this medicine, talk to your doctor, pharmacist, or health care provider.  2018 Elsevier/Gold Standard (2014-08-19 13:07:35) Donepezil tablets What is this medicine? DONEPEZIL (doe NEP e zil) is used to treat mild to moderate dementia caused by Alzheimer's disease. This medicine may be used for other purposes; ask your health care provider or pharmacist if you have questions. COMMON BRAND NAME(S): Aricept What should I tell my health care provider before I take this medicine? They need to know if  you have any of these conditions: -asthma or other lung disease -difficulty passing urine -head injury -heart  disease -history of irregular heartbeat -liver disease -seizures (convulsions) -stomach or intestinal disease, ulcers or stomach bleeding -an unusual or allergic reaction to donepezil, other medicines, foods, dyes, or preservatives -pregnant or trying to get pregnant -breast-feeding How should I use this medicine? Take this medicine by mouth with a glass of water. Follow the directions on the prescription label. You may take this medicine with or without food. Take this medicine at regular intervals. This medicine is usually taken before bedtime. Do not take it more often than directed. Continue to take your medicine even if you feel better. Do not stop taking except on your doctor's advice. If you are taking the 23 mg donepezil tablet, swallow it whole; do not cut, crush, or chew it. Talk to your pediatrician regarding the use of this medicine in children. Special care may be needed. Overdosage: If you think you have taken too much of this medicine contact a poison control center or emergency room at once. NOTE: This medicine is only for you. Do not share this medicine with others. What if I miss a dose? If you miss a dose, take it as soon as you can. If it is almost time for your next dose, take only that dose, do not take double or extra doses. What may interact with this medicine? Do not take this medicine with any of the following medications: -certain medicines for fungal infections like itraconazole, fluconazole, posaconazole, and voriconazole -cisapride -dextromethorphan; quinidine -dofetilide -dronedarone -pimozide -quinidine -thioridazine -ziprasidone This medicine may also interact with the following medications: -antihistamines for allergy, cough and cold -atropine -bethanechol -carbamazepine -certain medicines for bladder problems like oxybutynin, tolterodine -certain medicines for Parkinson's disease like benztropine, trihexyphenidyl -certain medicines for stomach  problems like dicyclomine, hyoscyamine -certain medicines for travel sickness like scopolamine -dexamethasone -ipratropium -NSAIDs, medicines for pain and inflammation, like ibuprofen or naproxen -other medicines for Alzheimer's disease -other medicines that prolong the QT interval (cause an abnormal heart rhythm) -phenobarbital -phenytoin -rifampin, rifabutin or rifapentine This list may not describe all possible interactions. Give your health care provider a list of all the medicines, herbs, non-prescription drugs, or dietary supplements you use. Also tell them if you smoke, drink alcohol, or use illegal drugs. Some items may interact with your medicine. What should I watch for while using this medicine? Visit your doctor or health care professional for regular checks on your progress. Check with your doctor or health care professional if your symptoms do not get better or if they get worse. You may get drowsy or dizzy. Do not drive, use machinery, or do anything that needs mental alertness until you know how this drug affects you. What side effects may I notice from receiving this medicine? Side effects that you should report to your doctor or health care professional as soon as possible: -allergic reactions like skin rash, itching or hives, swelling of the face, lips, or tongue -feeling faint or lightheaded, falls -loss of bladder control -seizures -signs and symptoms of a dangerous change in heartbeat or heart rhythm like chest pain; dizziness; fast or irregular heartbeat; palpitations; feeling faint or lightheaded, falls; breathing problems -signs and symptoms of infection like fever or chills; cough; sore throat; pain or trouble passing urine -signs and symptoms of liver injury like dark yellow or brown urine; general ill feeling or flu-like symptoms; light-colored stools; loss of appetite; nausea; right upper belly pain; unusually weak  or tired; yellowing of the eyes or skin -slow  heartbeat or palpitations -unusual bleeding or bruising -vomiting Side effects that usually do not require medical attention (report to your doctor or health care professional if they continue or are bothersome): -diarrhea, especially when starting treatment -headache -loss of appetite -muscle cramps -nausea -stomach upset This list may not describe all possible side effects. Call your doctor for medical advice about side effects. You may report side effects to FDA at 1-800-FDA-1088. Where should I keep my medicine? Keep out of reach of children. Store at room temperature between 15 and 30 degrees C (59 and 86 degrees F). Throw away any unused medicine after the expiration date. NOTE: This sheet is a summary. It may not cover all possible information. If you have questions about this medicine, talk to your doctor, pharmacist, or health care provider.  2018 Elsevier/Gold Standard (2015-08-03 21:00:42)

## 2017-02-10 NOTE — Progress Notes (Signed)
I have reviewed and agreed above plan. 

## 2017-04-07 ENCOUNTER — Ambulatory Visit (INDEPENDENT_AMBULATORY_CARE_PROVIDER_SITE_OTHER): Payer: Self-pay

## 2017-04-07 ENCOUNTER — Encounter (INDEPENDENT_AMBULATORY_CARE_PROVIDER_SITE_OTHER): Payer: Self-pay | Admitting: Orthopaedic Surgery

## 2017-04-07 ENCOUNTER — Ambulatory Visit (INDEPENDENT_AMBULATORY_CARE_PROVIDER_SITE_OTHER): Payer: Medicare Other | Admitting: Orthopaedic Surgery

## 2017-04-07 DIAGNOSIS — G8929 Other chronic pain: Secondary | ICD-10-CM

## 2017-04-07 DIAGNOSIS — M5441 Lumbago with sciatica, right side: Secondary | ICD-10-CM

## 2017-04-07 DIAGNOSIS — M5442 Lumbago with sciatica, left side: Secondary | ICD-10-CM

## 2017-04-07 DIAGNOSIS — M25522 Pain in left elbow: Secondary | ICD-10-CM | POA: Diagnosis not present

## 2017-04-07 NOTE — Progress Notes (Signed)
Office Visit Note   [name]           Date of Birth: 1940/07/19           MRN: [id number] Visit Date: 04/07/2017              Requested by: Blair HeysEhinger, Robert, MD [address] [place], [place] [address] PCP: Blair HeysEhinger, Robert, MD   Assessment & Plan: Visit Diagnoses:  1. Chronic bilateral low back pain with bilateral sciatica   2. Pain in left elbow     Plan: Chronic pain in back left buttock and left thigh. [name] relates she's had a number of falls over period of months. No bruising. No numbness or tingling. Has had a prior left total knee replacement many years ago but is not complaining of any pain and about her left knee. Films demonstrate diffuse degenerative changes the lumbar spine. I think her symptoms are referable to her back and probable claudication. We'll order MRI scan of lumbar spine and have her return sure thereafter. We'll also set up physical therapy for her neck and her back as I believe some of the pain in her left arm is related to her neck.  Follow-Up Instructions: Return after MRI L-S spine.   Orders:  Orders Placed This Encounter  Procedures  . XR Pelvis 1-2 Views  . XR Lumbar Spine 2-3 Views  . XR Elbow 2 Views Left   No orders of the defined types were placed in this encounter.     Procedures: No procedures performed   Clinical Data: No additional findings.   Subjective: Chief Complaint  Patient presents with  . Lower Back - Pain, Weakness    [name] is a 77 y o here today for LBP and L sided left hip pain x 1 yr. She is a Dr. D patient and let her know of this problem.  "She  . Left Leg - Pain, Edema, Weakness  Accompanied by her husband. Relates pain in her back left buttock and left thigh over many months. Seems to be worse when she stands for a length of time or walks any distance. Prior left total knee replacement without any symptoms referable to it. Also is been complaining of nondescript pain in  the area of her left elbow. No specific diagnosis by exam but could be related to referred pain from the cervical spine. Films of her elbow are negative  HPI  Review of Systems   Objective: Vital Signs: There were no vitals taken for this visit.  Physical Exam  Ortho Exam awake and alert. Had some limited range of motion of the cervical spine consistent with arthritis. No referred pain to either upper extremity. No pain about the left shoulder. Able to raise it overhead. No impingement , negative empty can testing. No localized areas of tenderness about the shoulder. Full range of motion of elbow. No pain along the medial lateral malleoli or ulnar nerve. No forearm pain. Good grip and good release. Skin intact Straight leg raise negative bilaterally. Left total knee replacement appears to be intact. No effusion in her increased heat about left knee. No instability. Full extension and flexion over 100. No calf pain or distal edema. No pain to range of motion of her hip. Some mild percussible tenderness of lumbar spine  Specialty Comments:  No specialty comments available.  Imaging: Xr Elbow 2 Views Left  Result Date: 04/07/2017 Films of the left elbow retained 2 projections.  No evidence of fracture about the elbow. No ectopic calcification or evidence of arthritis. The deep portion of the humerus and radius and ulna are also negative. No evidence of effusion or hemarthrosis  Xr Lumbar Spine 2-3 Views  Result Date: 04/07/2017 Films of the lumbar spine were obtained in the AP lateral projection. There are diffuse degenerative changes throughout the lumbar spine that are progressive from L1-L5. At L5-S1 there is a vacuum disc phenomenon with significant narrowing of the disc space. Also with  significant decrease in the disc spaces at L1-2 L2-3 L3-4. No evidence of  listhesis.  Xr Pelvis 1-2 Views  Result Date: 04/07/2017 AP the pelvis demonstrates some ectopic calcification along the very  lateral aspect of the acetabulum. Diffuse osteopenia but no evidence of a fracture or significant degenerative changes of either hip. No acute changes.    PMFS History: Patient Active Problem List   Diagnosis Date Noted  . Personal history of calcium pyrophosphate deposition disease (CPPD) 07/29/2016  . History of peripheral neuropathy 07/29/2016  . History of total knee replacement, right 07/22/2016  . Pseudogout 01/22/2016  . Primary osteoarthritis of both knees 01/22/2016  . Primary osteoarthritis of both hands 01/22/2016  . Primary osteoarthritis of both feet 01/22/2016  . Fibromyalgia 01/22/2016  . Primary insomnia 01/22/2016  . Obesity (BMI 30-39.9) 04/01/2014  . Obstructive sleep apnea 04/19/2013  . Essential hypertension, benign 04/19/2013  . Memory loss 08/02/2012   Past Medical History:  Diagnosis Date  . Anxiety   . Fibromyalgia   . GERD (gastroesophageal reflux disease)   . High blood pressure   . HSV (herpes simplex virus) anogenital infection   . IBS (irritable bowel syndrome)   . Memory loss   . Neuropathy   . Obesity (BMI 30-39.9) 04/01/2014  . Osteoarthritis   . Overactive bladder   . Restless leg syndrome   . Sleep apnea     Family History  Problem Relation Age of Onset  . Depression Mother   . Depression Father   . Mental illness Sister   . Mental illness Sister     Past Surgical History:  Procedure Laterality Date  . APPENDECTOMY    . CATARACT EXTRACTION Left   . CESAREAN SECTION    . CHOLECYSTECTOMY    . colonscopy    . MULTIPLE TOOTH EXTRACTIONS    . ROTATOR CUFF REPAIR Left   . TOTAL KNEE ARTHROPLASTY     Social History   Occupational History    Comment: retired  Tobacco Use  . Smoking status: Never Smoker  . Smokeless tobacco: Never Used  Substance and Sexual Activity  . Alcohol use: Yes    Alcohol/week: 0.0 oz    Comment: wine occas  . Drug use: No  . Sexual activity: Not on file     Valeria Batman, MD   Note - This  record has been created using [employer].  Chart creation errors have been sought, but may not always  have been located. Such creation errors do not reflect on  the standard of medical care.

## 2017-05-30 ENCOUNTER — Encounter: Payer: Self-pay | Admitting: Adult Health

## 2017-05-30 ENCOUNTER — Ambulatory Visit: Payer: Medicare Other | Admitting: Adult Health

## 2017-05-30 ENCOUNTER — Ambulatory Visit: Payer: Self-pay | Admitting: Adult Health

## 2017-05-30 VITALS — BP 136/85 | HR 80 | Ht 65.0 in | Wt 138.4 lb

## 2017-05-30 DIAGNOSIS — R443 Hallucinations, unspecified: Secondary | ICD-10-CM | POA: Diagnosis not present

## 2017-05-30 DIAGNOSIS — R413 Other amnesia: Secondary | ICD-10-CM | POA: Diagnosis not present

## 2017-05-30 NOTE — Patient Instructions (Signed)
Your Plan:  Memory score slightly declined since the last visit Continue to monitor Call back with the names of the two medications that was started If your symptoms worsen or you develop new symptoms please let us know.    Thank you for coming to see us at [facility]. I hope we have been able to provide you high quality care today.  You may receive a patient satisfaction survey over the next few weeks. We would appreciate your feedback and comments so that we may continue to improve ourselves and the health of our patients.

## 2017-05-30 NOTE — Progress Notes (Signed)
[name] DOB: 08/16/1940  REASON FOR VISIT: follow up HISTORY FROM: patient  HISTORY OF PRESENT ILLNESS: HISTORY Lynn Price): Lynn Price, 77 year old female was referred by her primary care physician Dr. Blair Heys for evaluation of memory trouble. She was last seen in June 2015 by [name] for memory loss  She has a history of fibromyalgia hypertension, anxiety, obstructive sleep apnea using CPAP and short-term memory trouble. She also has a history of bilateral feet paresthesias. 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 She denies significant family history of dementia   MRI of the brain 07/11/2011 shows mild changes of chronic microvascular ischemia and generalized cerebral atrophy.   The patient continues to drive and has gotten lost in unfamiliar surroundings. She continues to be independent with her activities of daily living. She plays golf intermittently otherwise she gets no regular exercise. She has not had any falls, long-standing history of frequency incontinence and on oxybutynin. Her Mini-Mental Status exam is stable. She returns for reevaluation  UPDATE June 6th 2016. She is with her husband at today's clinical visit, she continue has mild slow worsening memory trouble, she continued to drive short distances in a familiar route, she visit her friends, go to church regularly, able to clean house, independent at daily activity,  She also complains of bilateral feet numbness tingling, slow worsening, she is taking gabapentin 300 mg 2 tablets in the morning 3 tablets every night, does make her feel tired and sleepy,  She has not used her CPAP machine for a while, tends to go to bed late, take long nap in the afternoon,  She also complains of being fatigued, lack of energy  UPDATE Feb 03 2015: She is with her husband at today's clinical visit, complains of depression, Aricept continue causing mild  upset stomach, decreased appetite, mild worsening memory trouble, she also noticed her husband has been has mild memory trouble.  Update 08/12/2015: Lynn Price is a 77 year old female with a history of mild memory impairment and neuropathy. She returns today for follow-up. She states that her memory has remained the same. She is able to complete all ADLs independently. She operates a Librarian, academic without difficulty. She prepares meals at home. Her husband is retired and he has now taken over the finances. She denies having to give up anything due to her memory. She is no longer on Aricept nor Namenda. She could not tolerate these medications. She continues to have paresthesias in the feet. She uses gabapentin with good benefit. Denies any changes in her gait or balance. She does not use an assistive device when ambulating. Denies any falls. She returns today for an evaluation.  02/11/2016: Lynn Price is a 77 year old female with a history of memory disturbance and neuropathy. She returns today for follow-up. The patient states that her memory has remained stable. She is able to complete all ADLs independently. She operates a Librarian, academic without difficulty. She continues to prepare meals. Her husband does all the finances. She is not on any medical patient for her memory at this time. She continues to use gabapentin for neuropathy. She denies any falls. Her husband states that she tends to move slower and overall complete tasks slower than she did before. States that she sleeps well at night. Denies any hallucinations. Denies tremor. She denies any new issues. Returns today for an evaluation.   08/10/16:  Lynn Price Is a 77 year old female with a history of memory  disturbance and neuropathy. She returns today for follow-up. She reports that she has continued on gabapentin. She reports that this was decreased to 600 mg twice a day. She did initially notice some discomfort after they decreased the dose  but that has since resolved.She feels that this works well for her. She denies any significant discomfort. Denies any changes with her gait or balance. Denies any falls. She feels that her memory has remained Stable. However husband thinks it may have gotten worse.. She is not currently on any medication for her memory. She lives at home with her husband. She is able to operate a motor vehicle without difficulty. She prepares all meals without difficulty. Denies any trouble sleeping. She does not feel that she's had to give up anything due to her memory. Her husband states that approximately 2 months ago she began telling him that there is a mouse in the house. The patient states that she has never seen a mouse but she can hear it. This only occurs at night. The patient's husband state that he has never heard this. He states that a year or so ago they did have a mouse in the house but that has since resolved. The husband is concerned that this might be a type of hallucination. Denies any other type of hallucinations. She returns to evaluation.  Today 02/08/17  Lynn Price is a 77 year old female with a history of memory disturbance.  She returns today for follow-up.  She is currently not on any medication for her memory.   She reports that she feels that her memory has remained the same.  She is able to complete all ADLs independently.  She denies any trouble sleeping.  Denies any change in her appetite.  She reports that she continues to see mice in her home.  Her husband reports that he has had an exterminator come out and they cannot find any evidence that they have had any mice.  The patient states that she hears them at night and also hears them on the back deck.  She states that she is actually seen the mice in her bathroom but her husband disputes this claim.  She returns today for an evaluation   Today 05/30/17 Lynn Price is a 77 year old female with a history of memory disturbance.  She returns  today for follow-up.  She was at home with her husband.  Se is able to complete all ADLs independently.  She does not operate a motor vehicle.  Denies any trouble sleeping.  Denies any changes with her mood or behavior.  She continues to see and hear mice.  Her husband does not witness this.  She is currently not on any memory medication. She does report that her PCP started her on 2 medications but she is unsure of the name and what they are for. She returns today for an evaluation.   REVIEW OF SYSTEMS: Out of a complete 14 system review of symptoms, the patient complains only of the following symptoms, and all other reviewed systems are negative.  See HPI  ALLERGIES: Allergies  Allergen Reactions  . Erythromycin Rash  . Penicillins Rash  . Sulfa Antibiotics Rash  . Tetracyclines & Related Rash    HOME MEDICATIONS: Outpatient Medications Prior to Visit  Medication Sig Dispense Refill  . betamethasone, augmented, (DIPROLENE) 0.05 % lotion   3  . clobetasol (TEMOVATE) 0.05 % external solution     . diltiazem (CARDIZEM CD) 120 MG 24 hr capsule Take 120 mg  by mouth daily.    . fluticasone (FLONASE) 50 MCG/ACT nasal spray Place into both nostrils daily.    Marland Kitchen gabapentin (NEURONTIN) 300 MG capsule Take 2 capsules (600 mg total) by mouth 2 (two) times daily. 120 capsule 5  . LORazepam (ATIVAN) 0.5 MG tablet Take 1 mg by mouth 2 (two) times daily.     . mometasone (ELOCON) 0.1 % cream   3  . Omeprazole (HM OMEPRAZOLE) 20 MG TBEC Take 20 mg by mouth 2 (two) times daily.     Marland Kitchen triamcinolone (KENALOG) 0.025 % cream Apply topically 2 (two) times daily. Reported on 08/12/2015    . valACYclovir (VALTREX) 500 MG tablet Take 500 mg by mouth 2 (two) times daily.     Marland Kitchen venlafaxine XR (EFFEXOR XR) 150 MG 24 hr capsule Take 150 mg by mouth daily.     No facility-administered medications prior to visit.     PAST MEDICAL HISTORY: Past Medical History:  Diagnosis Date  . Anxiety   . Fibromyalgia   .  GERD (gastroesophageal reflux disease)   . High blood pressure   . HSV (herpes simplex virus) anogenital infection   . IBS (irritable bowel syndrome)   . Memory loss   . Neuropathy   . Obesity (BMI 30-39.9) 04/01/2014  . Osteoarthritis   . Overactive bladder   . Restless leg syndrome   . Sleep apnea     PAST SURGICAL HISTORY: Past Surgical History:  Procedure Laterality Date  . APPENDECTOMY    . CATARACT EXTRACTION Left   . CESAREAN SECTION    . CHOLECYSTECTOMY    . colonscopy    . MULTIPLE TOOTH EXTRACTIONS    . ROTATOR CUFF REPAIR Left   . TOTAL KNEE ARTHROPLASTY      FAMILY HISTORY: Family History  Problem Relation Age of Onset  . Depression Mother   . Depression Father   . Mental illness Sister   . Mental illness Sister     SOCIAL HISTORY: Social History   Socioeconomic History  . Marital status: Married    Spouse name: Dimas Aguas  . Number of children: 2  . Years of education: college  . Highest education level: Not on file  Occupational History    Comment: retired  Engineer, production  . Financial resource strain: Not on file  . Food insecurity:    Worry: Not on file    Inability: Not on file  . Transportation needs:    Medical: Not on file    Non-medical: Not on file  Tobacco Use  . Smoking status: Never Smoker  . Smokeless tobacco: Never Used  Substance and Sexual Activity  . Alcohol use: Yes    Alcohol/week: 0.0 oz    Comment: wine occas  . Drug use: No  . Sexual activity: Not on file  Lifestyle  . Physical activity:    Days per week: Not on file    Minutes per session: Not on file  . Stress: Not on file  Relationships  . Social connections:    Talks on phone: Not on file    Gets together: Not on file    Attends religious service: Not on file    Active member of club or organization: Not on file    Attends meetings of clubs or organizations: Not on file    Relationship status: Not on file  . Intimate partner violence:    Fear of current or ex  partner: Not on file    Emotionally abused:  Not on file    Physically abused: Not on file    Forced sexual activity: Not on file  Other Topics Concern  . Not on file  Social History Narrative   Patient lives at home with her husband Dimas Aguas) and she is retired. Patient has 2 years of college. Patient has 2 children.       PHYSICAL EXAM  Vitals:   05/30/17 1358  BP: 136/85  Pulse: 80  Weight: 138 lb 6.4 oz (62.8 kg)  Height: 5\' 5"  (1.651 m)   Body mass index is 23.03 kg/m.   MMSE - Mini Mental State Exam 05/30/2017 02/08/2017 08/10/2016  Orientation to time 5 4 4   Orientation to Place 4 5 5   Registration 3 3 3   Attention/ Calculation 3 5 0  Attention/Calculation-comments - couldnt do numbers -  Recall 2 1 2   Language- name 2 objects 2 2 2   Language- repeat 1 1 1   Language- follow 3 step command 2 2 3   Language- read & follow direction 1 1 1   Write a sentence 0 1 1  Copy design 1 1 1   Total score 24 26 23      Generalized: Well developed, in no acute distress   Neurological examination  Mentation: Alert oriented to time, place, history taking. Follows all commands speech and language fluent Cranial nerve II-XII: Pupils were equal round reactive to light. Extraocular movements were full, visual field were full on confrontational test. Facial sensation and strength were normal. Uvula tongue midline. Head turning and shoulder shrug  were normal and symmetric. Motor: The motor testing reveals 5 over 5 strength of all 4 extremities. Good symmetric motor tone is noted throughout.  Sensory: Sensory testing is intact to soft touch on all 4 extremities. No evidence of extinction is noted.  Coordination: Cerebellar testing reveals good finger-nose-finger and heel-to-shin bilaterally.  Gait and station: Gait is normal. Tandem gait is unsteady. Romberg is negative. No drift is seen.  Reflexes: Deep tendon reflexes are symmetric and normal bilaterally.   DIAGNOSTIC DATA (LABS,  IMAGING, TESTING) - I reviewed patient records, labs, notes, testing and imaging myself where available.  Lab Results  Component Value Date   WBC 9.5 03/27/2015   HGB 11.0 (L) 03/27/2015   HCT 34.4 (L) 03/27/2015   MCV 88.9 03/27/2015   PLT 181 03/27/2015      Component Value Date/Time   NA 140 03/27/2015 0132   K 3.7 03/27/2015 0132   CL 104 03/27/2015 0132   CO2 23 03/27/2015 0132   GLUCOSE 106 (H) 03/27/2015 0132   BUN 12 03/27/2015 0132   CREATININE 0.88 03/27/2015 0132   CALCIUM 8.8 (L) 03/27/2015 0132   PROT 7.2 11/12/2009 1118   ALBUMIN 4.1 11/12/2009 1118   AST 64 (H) 11/12/2009 1118   ALT 50 (H) 11/12/2009 1118   ALKPHOS 115 11/12/2009 1118   BILITOT 0.7 11/12/2009 1118   GFRNONAA >60 03/27/2015 0132   GFRAA >60 03/27/2015 0132      ASSESSMENT AND PLAN 77 y.o. year old female  has a past medical history of Anxiety, Fibromyalgia, GERD (gastroesophageal reflux disease), High blood pressure, HSV (herpes simplex virus) anogenital infection, IBS (irritable bowel syndrome), Memory loss, Neuropathy, Obesity (BMI 30-39.9) (04/01/2014), Osteoarthritis, Overactive bladder, Restless leg syndrome, and Sleep apnea. here with:  1.  Memory disturbance 2.  Hallucinations  The patient's memory score has declined since the last visit.  The patient reports that she was placed on 2 medications by her PCP but  she is unsure of the name.  She believes one of them may be a memory medication.  Her husband states that he will call back with the names of these medications.  At this time the patient is unsure that she wants to add on any additional medication.  We will continue to monitor her memory.  If her symptoms worsen or she develops new symptoms she should let us know.  We will follow-up in 6 months or sooner if needed.      Butch Penny, MSN, NP-C 05/30/2017, 1:47 PM [facility] [address] [place], [place] [address] [phone]

## 2017-05-31 NOTE — Progress Notes (Signed)
I have reviewed and agreed above plan. 

## 2017-06-08 ENCOUNTER — Telehealth (INDEPENDENT_AMBULATORY_CARE_PROVIDER_SITE_OTHER): Payer: Self-pay | Admitting: Orthopaedic Surgery

## 2017-06-08 NOTE — Telephone Encounter (Signed)
Patient is very confused about what doctor discussed with her at last office visit in February. Please call to discuss xrays results from that day, and plan of action.

## 2017-06-12 ENCOUNTER — Other Ambulatory Visit (INDEPENDENT_AMBULATORY_CARE_PROVIDER_SITE_OTHER): Payer: Self-pay | Admitting: Radiology

## 2017-06-12 DIAGNOSIS — M545 Low back pain: Secondary | ICD-10-CM

## 2017-06-12 NOTE — Telephone Encounter (Signed)
Left message that l spine mri was ordered and being processed through insurance. And PT for neck and back were ordered and will be calling her to set up appt.

## 2017-06-20 ENCOUNTER — Ambulatory Visit: Payer: Medicare Other | Attending: Orthopaedic Surgery | Admitting: Physical Therapy

## 2017-06-23 ENCOUNTER — Ambulatory Visit
Admission: RE | Admit: 2017-06-23 | Discharge: 2017-06-23 | Disposition: A | Discharge status: Home / Self Care | Payer: Medicare Other | Source: Ambulatory Visit | Attending: Orthopaedic Surgery | Admitting: Orthopaedic Surgery

## 2017-06-23 DIAGNOSIS — M545 Low back pain: Secondary | ICD-10-CM

## 2017-06-26 ENCOUNTER — Ambulatory Visit (INDEPENDENT_AMBULATORY_CARE_PROVIDER_SITE_OTHER): Payer: Medicare Other | Admitting: Orthopaedic Surgery

## 2017-06-26 ENCOUNTER — Encounter (INDEPENDENT_AMBULATORY_CARE_PROVIDER_SITE_OTHER): Payer: Self-pay | Admitting: Orthopaedic Surgery

## 2017-06-26 VITALS — BP 124/72 | HR 16 | Resp 16 | Ht 64.0 in | Wt 138.0 lb

## 2017-06-26 DIAGNOSIS — G8929 Other chronic pain: Secondary | ICD-10-CM

## 2017-06-26 DIAGNOSIS — M5442 Lumbago with sciatica, left side: Secondary | ICD-10-CM

## 2017-06-26 NOTE — Progress Notes (Signed)
Office Visit Note   [name]           Date of Birth: Sep 27, 1940           MRN: [id number] Visit Date: 06/26/2017              Requested by: Blair Heys, MD [address] [place], [place] [address] PCP: Blair Heys, MD   Assessment & Plan: Visit Diagnoses:  1. Chronic left-sided low back pain with left-sided sciatica     Plan: Chronic history of low back pain with recent exacerbation of back and left lower extremity pain.  MRI scan performed and compared to the study in 2009.  Mild canal narrowing at L2-3 is unchanged.  Mild spinal stenosis at L3-4 and subarticular foraminal stenosis on the left at L3-4.  There is progression of a right-sided disc protrusion at L4-5 with expected impingement of the right L5 nerve root.  Mild progression of spinal stenosis right greater than left.  Diffuse endplate spurring right greater than left L5-S1 is unchanged.  No pain on right side.  Will ask Dr. Alvester Morin to evaluate for repeat epidural steroid injection.  I would also like neurology to evaluate Lynn Price is I am not sure I can fully explain her left lower extremity weakness on the MRI scan and return in 1 month  Follow-Up Instructions: Return in about 1 month (around 07/26/2017).   Orders:  No orders of the defined types were placed in this encounter.  No orders of the defined types were placed in this encounter.     Procedures: No procedures performed   Clinical Data: No additional findings.   Subjective: No chief complaint on file. Lynn Price is accompanied by her husband and here for follow-up evaluation of low back pain associated with left lower extremity radiculopathy.  She had an MRI scan with the results as above.  She is having pain at night and some weakness of her left lower extremity.  The predominant findings are on the right side per MRI scan.  She does experience some numbness and tingling in her right lower extremity.  She notes that  she has fallen on several occasions that she thinks is related to the weakness.  She does have difficulty at night with her back pain.  HPI  Review of Systems   Objective: Vital Signs: There were no vitals taken for this visit.  Physical Exam  Constitutional: She is oriented to person, place, and time. She appears well-developed and well-nourished.  HENT:  Mouth/Throat: Oropharynx is clear and moist.  Eyes: Pupils are equal, round, and reactive to light. EOM are normal.  Pulmonary/Chest: Effort normal.  Neurological: She is alert and oriented to person, place, and time.  Skin: Skin is warm and dry.  Psychiatric: She has a normal mood and affect. Her behavior is normal.    Ortho Exam awake alert and oriented x3.  Comfortable sitting.  Straight leg raise is negative bilaterally although the patient has subjective weakness to left lower extremity thought she had good strength.  No edema.  Straight leg raise is negative.  Motor and sensory exam appears to be intact.  Has left total knee replacement that appears to be intact without increased redness heat or effusion.  Some percussible tenderness of the lower lumbar spine.  Specialty Comments:  No specialty comments available.  Imaging: No results found.   PMFS History: Patient Active Problem List   Diagnosis Date Noted  . Personal  history of calcium pyrophosphate deposition disease (CPPD) 07/29/2016  . History of peripheral neuropathy 07/29/2016  . History of total knee replacement, right 07/22/2016  . Pseudogout 01/22/2016  . Primary osteoarthritis of both knees 01/22/2016  . Primary osteoarthritis of both hands 01/22/2016  . Primary osteoarthritis of both feet 01/22/2016  . Fibromyalgia 01/22/2016  . Primary insomnia 01/22/2016  . Obesity (BMI 30-39.9) 04/01/2014  . Obstructive sleep apnea 04/19/2013  . Essential hypertension, benign 04/19/2013  . Memory loss 08/02/2012   Past Medical History:  Diagnosis Date  . Anxiety    . Fibromyalgia   . GERD (gastroesophageal reflux disease)   . High blood pressure   . HSV (herpes simplex virus) anogenital infection   . IBS (irritable bowel syndrome)   . Memory loss   . Neuropathy   . Obesity (BMI 30-39.9) 04/01/2014  . Osteoarthritis   . Overactive bladder   . Restless leg syndrome   . Sleep apnea     Family History  Problem Relation Age of Onset  . Depression Mother   . Depression Father   . Mental illness Sister   . Mental illness Sister     Past Surgical History:  Procedure Laterality Date  . APPENDECTOMY    . CATARACT EXTRACTION Left   . CESAREAN SECTION    . CHOLECYSTECTOMY    . colonscopy    . MULTIPLE TOOTH EXTRACTIONS    . ROTATOR CUFF REPAIR Left   . TOTAL KNEE ARTHROPLASTY     Social History   Occupational History    Comment: retired  Tobacco Use  . Smoking status: Never Smoker  . Smokeless tobacco: Never Used  Substance and Sexual Activity  . Alcohol use: Yes    Alcohol/week: 0.0 oz    Comment: wine occas  . Drug use: No  . Sexual activity: Not on file     Valeria Batman, MD   Note - This record has been created using [employer].  Chart creation errors have been sought, but may not always  have been located. Such creation errors do not reflect on  the standard of medical care.

## 2017-06-27 ENCOUNTER — Other Ambulatory Visit (INDEPENDENT_AMBULATORY_CARE_PROVIDER_SITE_OTHER): Payer: Self-pay | Admitting: Radiology

## 2017-06-27 DIAGNOSIS — M5442 Lumbago with sciatica, left side: Principal | ICD-10-CM

## 2017-06-27 DIAGNOSIS — G8929 Other chronic pain: Secondary | ICD-10-CM

## 2017-06-27 DIAGNOSIS — M545 Low back pain: Secondary | ICD-10-CM

## 2017-07-01 ENCOUNTER — Other Ambulatory Visit: Payer: Self-pay | Admitting: Rheumatology

## 2017-07-17 ENCOUNTER — Ambulatory Visit (INDEPENDENT_AMBULATORY_CARE_PROVIDER_SITE_OTHER): Payer: Medicare Other

## 2017-07-17 ENCOUNTER — Encounter (INDEPENDENT_AMBULATORY_CARE_PROVIDER_SITE_OTHER): Payer: Self-pay | Admitting: Physical Medicine and Rehabilitation

## 2017-07-17 ENCOUNTER — Ambulatory Visit (INDEPENDENT_AMBULATORY_CARE_PROVIDER_SITE_OTHER): Payer: Medicare Other | Admitting: Physical Medicine and Rehabilitation

## 2017-07-17 VITALS — BP 114/66 | HR 75

## 2017-07-17 DIAGNOSIS — M5416 Radiculopathy, lumbar region: Secondary | ICD-10-CM

## 2017-07-17 MED ORDER — METHYLPREDNISOLONE ACETATE 80 MG/ML IJ SUSP
80.0000 mg | Freq: Once | INTRAMUSCULAR | Status: AC
  Administered 2017-07-17: 80 mg

## 2017-07-17 NOTE — Progress Notes (Signed)
 .  Numeric Pain Rating Scale and Functional Assessment Average Pain 8   In the last MONTH (on 0-10 scale) has pain interfered with the following?  1. General activity like being  able to carry out your everyday physical activities such as walking, climbing stairs, carrying groceries, or moving a chair?  Rating(8)   +Driver, -BT, -Dye Allergies.  

## 2017-07-17 NOTE — Patient Instructions (Signed)

## 2017-07-19 ENCOUNTER — Other Ambulatory Visit: Payer: Self-pay | Admitting: Rheumatology

## 2017-07-19 ENCOUNTER — Telehealth: Payer: Self-pay | Admitting: Rheumatology

## 2017-07-19 NOTE — Telephone Encounter (Signed)
Patient's husband left a voicemail stating that [facility] told them Erlean's prescription of Gabapentin has been denied.  Patient's husband Dimas Aguas is requesting a return call.

## 2017-07-20 MED ORDER — GABAPENTIN 300 MG PO CAPS: 600.0000 mg | ORAL_CAPSULE | Freq: Two times a day (BID) | ORAL | 0 refills | Status: DC

## 2017-07-20 NOTE — Telephone Encounter (Signed)
Returned patient's call and advised patient that refill request was denied because she did not have a follow up scheduled and has not been seen since 07/2016.  Last visit: 08/03/2016 Next visit: 08/02/2017  Okay to refill per Dr. Corliss Skains.

## 2017-07-21 ENCOUNTER — Other Ambulatory Visit (INDEPENDENT_AMBULATORY_CARE_PROVIDER_SITE_OTHER): Payer: Self-pay | Admitting: Radiology

## 2017-07-21 ENCOUNTER — Encounter (INDEPENDENT_AMBULATORY_CARE_PROVIDER_SITE_OTHER): Payer: Self-pay | Admitting: Orthopaedic Surgery

## 2017-07-21 ENCOUNTER — Ambulatory Visit (INDEPENDENT_AMBULATORY_CARE_PROVIDER_SITE_OTHER): Payer: Medicare Other | Admitting: Orthopaedic Surgery

## 2017-07-21 VITALS — BP 126/73 | HR 79 | Ht 65.0 in | Wt 137.0 lb

## 2017-07-21 DIAGNOSIS — M5442 Lumbago with sciatica, left side: Secondary | ICD-10-CM | POA: Diagnosis not present

## 2017-07-21 DIAGNOSIS — G8929 Other chronic pain: Secondary | ICD-10-CM

## 2017-07-21 MED ORDER — GABAPENTIN 300 MG PO CAPS: 600.0000 mg | ORAL_CAPSULE | Freq: Two times a day (BID) | ORAL | 0 refills | Status: DC

## 2017-07-21 NOTE — Progress Notes (Signed)
Office Visit Note   [name]           Date of Birth: 1940/03/05           MRN: [id number] Visit Date: 07/21/2017              Requested by: Blair Heys, MD [address] [place], [place] [address] PCP: Blair Heys, MD   Assessment & Plan: Visit Diagnoses:  1. Chronic left-sided low back pain with left-sided sciatica     Plan: Had epidural steroid injection by Dr. Alvester Morin on 5/20.  Relates feeling much better with less pain in her left leg and her back.  Pain is now episodic rather than constant.  Has been taking gabapentin on a long-term basis.  Will renew until she can see Dr. Corliss Skains who initially prescribed the medicine.  Taking 600 mg twice a day.  We will plan to see back as needed.  Encouraged exercises.  Lynn Price can download them on the computer.  Will call Dr. Alvester Morin if another [facility] is indicated  Follow-Up Instructions: Return if symptoms worsen or fail to improve.   Orders:  No orders of the defined types were placed in this encounter.  No orders of the defined types were placed in this encounter.     Procedures: No procedures performed   Clinical Data: No additional findings.   Subjective: Chief Complaint  Patient presents with  . Lower Back - Follow-up  . Follow-up    BACK INJECTION W DR NEWTON 07/17/17 DOING GOOD SO FAR  Having less pain in her back in her leg.  Sleeping better.  Has some pain when she wakes up in the morning.  Has an appointment to see the neurologist for the weakness in her lower extremities.  I was not sure I could explain all of the weakness based on the MRI scan of the lumbar spine.  Thus, the reason for the neurology evaluation  HPI  Review of Systems  Constitutional: Positive for fatigue.  HENT: Negative for ear pain.   Eyes: Positive for pain.  Respiratory: Negative for cough and shortness of breath.   Cardiovascular: Positive for leg swelling.  Gastrointestinal: Positive for  constipation. Negative for diarrhea.  Genitourinary: Positive for difficulty urinating.  Musculoskeletal: Positive for back pain and neck pain.  Skin: Negative for rash.  Allergic/Immunologic: Negative for food allergies.  Neurological: Positive for weakness. Negative for numbness.  Hematological: Bruises/bleeds easily.  Psychiatric/Behavioral: Positive for sleep disturbance.     Objective: Vital Signs: BP 126/73 (BP Location: Left Arm, Patient Position: Sitting, Cuff Size: Normal)   Pulse 79   Ht  (1.651 m)   Wt 137 lb (62.1 kg)   BMI 22.80 kg/m   Physical Exam  Constitutional: She is oriented to person, place, and time. She appears well-developed and well-nourished.  HENT:  Mouth/Throat: Oropharynx is clear and moist.  Eyes: Pupils are equal, round, and reactive to light. EOM are normal.  Pulmonary/Chest: Effort normal.  Neurological: She is alert and oriented to person, place, and time.  Skin: Skin is warm and dry.  Psychiatric: She has a normal mood and affect. Her behavior is normal.    Ortho Exam awake alert and oriented x3.  Comfortable sitting nicely dressed.  Right leg raise negative bilaterally.  Motor exam intact.  Good pulses to both feet.  Painless range of motion of both hips and knees.  Prior left total knee replacement.  No percussible tenderness of  the lumbar spine  Specialty Comments:  No specialty comments available.  Imaging: No results found.   PMFS History: Patient Active Problem List   Diagnosis Date Noted  . Chronic left-sided low back pain with left-sided sciatica 07/21/2017  . Personal history of calcium pyrophosphate deposition disease (CPPD) 07/29/2016  . History of peripheral neuropathy 07/29/2016  . History of total knee replacement, right 07/22/2016  . Pseudogout 01/22/2016  . Primary osteoarthritis of both knees 01/22/2016  . Primary osteoarthritis of both hands 01/22/2016  . Primary osteoarthritis of both feet 01/22/2016  .  Fibromyalgia 01/22/2016  . Primary insomnia 01/22/2016  . Obesity (BMI 30-39.9) 04/01/2014  . Obstructive sleep apnea 04/19/2013  . Essential hypertension, benign 04/19/2013  . Memory loss 08/02/2012   Past Medical History:  Diagnosis Date  . Anxiety   . Fibromyalgia   . GERD (gastroesophageal reflux disease)   . High blood pressure   . HSV (herpes simplex virus) anogenital infection   . IBS (irritable bowel syndrome)   . Memory loss   . Neuropathy   . Obesity (BMI 30-39.9) 04/01/2014  . Osteoarthritis   . Overactive bladder   . Restless leg syndrome   . Sleep apnea     Family History  Problem Relation Age of Onset  . Depression Mother   . Depression Father   . Mental illness Sister   . Mental illness Sister     Past Surgical History:  Procedure Laterality Date  . APPENDECTOMY    . CATARACT EXTRACTION Left   . CESAREAN SECTION    . CHOLECYSTECTOMY    . colonscopy    . MULTIPLE TOOTH EXTRACTIONS    . ROTATOR CUFF REPAIR Left   . TOTAL KNEE ARTHROPLASTY     Social History   Occupational History    Comment: retired  Tobacco Use  . Smoking status: Never Smoker  . Smokeless tobacco: Never Used  Substance and Sexual Activity  . Alcohol use: Yes    Alcohol/week: 0.0 oz    Comment: wine occas  . Drug use: No  . Sexual activity: Not on file

## 2017-07-25 NOTE — Progress Notes (Signed)
Office Visit Note  [name]             Date of Birth: Apr 26, 1940           MRN: [id number]             PCP: Blair Heys, MD Referring: Blair Heys, MD Visit Date: 08/02/2017 Occupation: @    Subjective:  Left hip pain.   History of Present Illness: Lynn Price is a 77 y.o. female with history of pseudogout osteoarthritis and fibromyalgia.  She states she has been having severe pain and discomfort over her left trochanteric area.  She has been having difficulty walking and sleeping on her side.  She continues to have discomfort in her bilateral hands knee joints and her feet due to osteoarthritis.  Her left total knee replacement is doing well.  She also has some generalized pain from fibromyalgia.  Activities of Daily Living:  Patient reports morning stiffness for 0 minute.   Patient Reports nocturnal pain.  Difficulty dressing/grooming: Denies Difficulty climbing stairs: Reports Difficulty getting out of chair: Reports Difficulty using hands for taps, buttons, cutlery, and/or writing: Denies   Review of Systems  Constitutional: Positive for fatigue. Negative for night sweats, weight gain and weight loss.  HENT: Negative for mouth sores, trouble swallowing, trouble swallowing, mouth dryness and nose dryness.   Eyes: Positive for dryness. Negative for pain, redness and visual disturbance.  Respiratory: Negative for cough, shortness of breath and difficulty breathing.   Cardiovascular: Negative for chest pain, palpitations, hypertension, irregular heartbeat and swelling in legs/feet.  Gastrointestinal: Negative for blood in stool, constipation and diarrhea.  Endocrine: Negative for increased urination.  Genitourinary: Negative for vaginal dryness.  Musculoskeletal: Positive for arthralgias, joint pain, myalgias and myalgias. Negative for joint swelling, muscle weakness, morning stiffness and muscle tenderness.  Skin: Negative for color change,  rash, hair loss, skin tightness, ulcers and sensitivity to sunlight.  Allergic/Immunologic: Negative for susceptible to infections.  Neurological: Negative for dizziness, memory loss, night sweats and weakness.  Hematological: Negative for swollen glands.  Psychiatric/Behavioral: Positive for depressed mood and sleep disturbance. The patient is not nervous/anxious.     PMFS History:  Patient Active Problem List   Diagnosis Date Noted  . Chronic left-sided low back pain with left-sided sciatica 07/21/2017  . Personal history of calcium pyrophosphate deposition disease (CPPD) 07/29/2016  . History of peripheral neuropathy 07/29/2016  . History of total knee replacement, right 07/22/2016  . Pseudogout 01/22/2016  . Primary osteoarthritis of both knees 01/22/2016  . Primary osteoarthritis of both hands 01/22/2016  . Primary osteoarthritis of both feet 01/22/2016  . Fibromyalgia 01/22/2016  . Primary insomnia 01/22/2016  . Obesity (BMI 30-39.9) 04/01/2014  . Obstructive sleep apnea 04/19/2013  . Essential hypertension, benign 04/19/2013  . Memory loss 08/02/2012    Past Medical History:  Diagnosis Date  . Anxiety   . Fibromyalgia   . GERD (gastroesophageal reflux disease)   . High blood pressure   . HSV (herpes simplex virus) anogenital infection   . IBS (irritable bowel syndrome)   . Memory loss   . Neuropathy   . Obesity (BMI 30-39.9) 04/01/2014  . Osteoarthritis   . Overactive bladder   . Restless leg syndrome   . Sleep apnea     Family History  Problem Relation Age of Onset  . Depression Mother   . Depression Father   . Mental illness Sister   . Mental illness Sister    Past  Surgical History:  Procedure Laterality Date  . APPENDECTOMY    . CATARACT EXTRACTION Left   . CESAREAN SECTION    . CHOLECYSTECTOMY    . colonscopy    . MULTIPLE TOOTH EXTRACTIONS    . ROTATOR CUFF REPAIR Left   . TOTAL KNEE ARTHROPLASTY     Social History   Social History Narrative    Patient lives at home with her husband Dimas Aguas) and she is retired. Patient has 2 years of college. Patient has 2 children.      Objective: Vital Signs: BP 114/72 (BP Location: Left Arm, Patient Position: Sitting, Cuff Size: Small)   Pulse 78   Resp 12   Ht  (1.676 m)   Wt 139 lb (63 kg)   BMI 22.44 kg/m    Physical Exam  Constitutional: She is oriented to person, place, and time. She appears well-developed and well-nourished.  HENT:  Head: Normocephalic and atraumatic.  Eyes: Conjunctivae and EOM are normal.  Neck: Normal range of motion.  Cardiovascular: Normal rate, regular rhythm, normal heart sounds and intact distal pulses.  Pulmonary/Chest: Effort normal and breath sounds normal.  Abdominal: Soft. Bowel sounds are normal.  Lymphadenopathy:    She has no cervical adenopathy.  Neurological: She is alert and oriented to person, place, and time.  Skin: Skin is warm and dry. Capillary refill takes less than 2 seconds.  Psychiatric: She has a normal mood and affect. Her behavior is normal.  Nursing note and vitals reviewed.    Musculoskeletal Exam: C-spine thoracic lumbar spine good range of motion.  Shoulder joints elbow joints wrist joints are good range of motion.  She has DIP PIP thickening of her hands and feet.  She is also some crepitus in her knee joints.  Her left knee has been replaced.  She has tenderness on palpation over left trochanteric bursa.  She has generalized hyperalgesia and positive tender points.  CDAI Exam: No CDAI exam completed.    Investigation: No additional findings.   Imaging: Xr C-arm No Report  Result Date: 07/17/2017 Please see Notes or Procedures tab for imaging impression.   Speciality Comments: No specialty comments available.    Procedures:  Large Joint Inj: L greater trochanter on 08/02/2017 3:39 PM Indications: pain Details: 27 G 1.5 in needle, lateral approach  Arthrogram: No  Medications: 1.5 mL lidocaine (PF) 1 %;  40 mg triamcinolone acetonide 40 MG/ML Aspirate: 0 mL Outcome: tolerated well, no immediate complications Procedure, treatment alternatives, risks and benefits explained, specific risks discussed. Consent was given by the patient. Immediately prior to procedure a time out was called to verify the correct patient, procedure, equipment, support staff and site/side marked as required. Patient was prepped and draped in the usual sterile fashion.     Allergies: Erythromycin; Penicillins; Sulfa antibiotics; and Tetracyclines & related   Assessment / Plan:     Visit Diagnoses: Pseudogout-patient has not had any flare of pseudogout.  Trochanteric bursitis of left hip-she has severe pain and discomfort over her left trochanteric area.  Different treatment options were discussed.  Left trochanteric bursa was injected with cortisone per request.  I have advised her to monitor blood pressure.  A handout on IT band exercises was given.  I have also referred her to physical therapy.  She has been advised to use a mattress pad.  Primary osteoarthritis of both hands-joint protection muscle strengthening was discussed.  Primary osteoarthritis of right knee-she has chronic discomfort which is tolerable.  History of  total knee replacement, left-doing fairly well.  Primary osteoarthritis of both feet-proper fitting shoes were discussed.  Fibromyalgia-have some generalized pain.    Primary insomnia-she is having increased insomnia due to nocturnal pain.  History of peripheral neuropathy-she is on Neurontin.  She experiences some dizziness and imbalance.  I have advised her to taper her Neurontin if tolerated.  She will discuss this further with her PCP.  Other medical problems are listed as follows:  History of memory loss  History of urinary retention with recurrent UTIs  History of sleep apnea  History of anxiety  History of seborrheic dermatitis  Vitamin D deficiency  Gait disturbance     Orders: Orders Placed This Encounter  Procedures  . Large Joint Inj   No orders of the defined types were placed in this encounter.   Face-to-face time spent with patient was 30 minutes. >50% of time was spent in counseling and coordination of care.  Follow-Up Instructions: Return if symptoms worsen or fail to improve, for Osteoarthritis, CPPD,FMS.   Pollyann Savoy, MD  Note - This record has been created using Animal nutritionist.  Chart creation errors have been sought, but may not always  have been located. Such creation errors do not reflect on  the standard of medical care.

## 2017-07-28 NOTE — Progress Notes (Signed)
Lynn Price - 77 y.o. female MRN [id number]  Date of birth: 12-12-1940  Office Visit Note: Visit Date: 07/17/2017 PCP: Blair Heys, MD Referred by: Blair Heys, MD  Subjective: Chief Complaint  Patient presents with  . Lower Back - Pain  . Left Leg - Pain   HPI: [name] is a 77 year old female who comes in today at the request of Dr. Norlene Campbell for left L4-5 interlaminar epidural steroid injection.  She has had updated MRI which shows progressive spondylosis and stenosis at L4-5 with some regression of prior left-sided disc fragment.  She has more right-sided pathology but does have moderate stenosis at L4-5.  She is having left radicular type pain and weakness.  Her symptoms have been present for a long time she reports today.  She says doing too much work can make it worse and nothing really makes it better.  She reports her pain averages 8 out of 10.  Her complete a left L4-5 interlaminar epidural steroid injection.  She has had injections in the past which were somewhat beneficial.  She could be having some residual mild nerve damage from prior left-sided disc fragment.  Dr. Cleophas Dunker suggested neurological work-up and I would suggest electrodiagnostic study by neurology may give him some idea if there is some mild nerve damage.   ROS Otherwise per HPI.  Assessment & Plan: Visit Diagnoses:  1. Lumbar radiculopathy     Plan: No additional findings.   Meds & Orders:  Meds ordered this encounter  Medications  . methylPREDNISolone acetate (DEPO-MEDROL) injection 80 mg    Orders Placed This Encounter  Procedures  . XR C-ARM NO REPORT  . Epidural Steroid injection    Follow-up: No follow-ups on file.   Procedures: No procedures performed  Lumbar Epidural Steroid Injection - Interlaminar Approach with Fluoroscopic Guidance  [name]      Date of Birth: 27-Jan-1941 MRN: [id number] PCP: Blair Heys, MD      Visit Date: 07/17/2017     Universal Protocol:     Consent Given By: the patient  Position: PRONE  Additional Comments: Vital signs were monitored before and after the procedure. Patient was prepped and draped in the usual sterile fashion. The correct patient, procedure, and site was verified.   Injection Procedure Details:  Procedure Site One Meds Administered:  Meds ordered this encounter  Medications  . methylPREDNISolone acetate (DEPO-MEDROL) injection 80 mg     Laterality: Left  Location/Site:  L4-L5  Needle size: 20 G  Needle type: Tuohy  Needle Placement: Paramedian epidural  Findings:   -Comments: Excellent flow of contrast into the epidural space.  Procedure Details: Using a paramedian approach from the side mentioned above, the region overlying the inferior lamina was localized under fluoroscopic visualization and the soft tissues overlying this structure were infiltrated with 4 ml. of 1% Lidocaine without Epinephrine. The Tuohy needle was inserted into the epidural space using a paramedian approach.   The epidural space was localized using loss of resistance along with lateral and bi-planar fluoroscopic views.  After negative aspirate for air, blood, and CSF, a 2 ml. volume of Isovue-250 was injected into the epidural space and the flow of contrast was observed. Radiographs were obtained for documentation purposes.    The injectate was administered into the level noted above.   Additional Comments:  The patient tolerated the procedure well Dressing: Band-Aid    Post-procedure details: Patient was observed during the procedure. Post-procedure instructions were reviewed.  Patient left the clinic in stable condition.   Clinical History: MRI LUMBAR SPINE WITHOUT CONTRAST  TECHNIQUE: Multiplanar, multisequence MR imaging of the lumbar spine was performed. No intravenous contrast was administered.  COMPARISON:  Lumbar radiographs 04/07/2017, MRI  03/21/2007  FINDINGS: Segmentation:  Normal  Alignment:  Mild retrolisthesis L1-2.  Mild anterolisthesis L2-3.  Vertebrae:  Negative for fracture or mass.  Conus medullaris and cauda equina: Conus extends to the L1-2 level. Conus and cauda equina appear normal.  Paraspinal and other soft tissues: Negative for retroperitoneal mass. No paraspinous fluid collections  Disc levels:  T12-L1: Mild disc and facet degeneration  L1-2: Mild disc and facet degeneration.  L2-3: Mild anterolisthesis with disc bulging and moderate facet degeneration. Mild narrowing of the canal similar to the prior MRI  L3-4: Diffuse disc bulging. Small left-sided disc protrusion with mild down turning of disc material has progressed in the interval. Bilateral facet hypertrophy. Mild spinal stenosis. Subarticular stenosis left greater than right with mild left foraminal narrowing  L4-5: Progression of right-sided disc protrusion with right subarticular stenosis and expected impingement of the right L5 nerve root. Improvement in left-sided disc fragment. Bilateral facet hypertrophy with moderate spinal stenosis. Mild progression of stenosis which is greater on the right than the left. Moderate right foraminal stenosis.  L5-S1: Advanced disc degeneration with degenerative endplate changes. Broad-based spurring right greater than left is unchanged. There is subarticular stenosis and impingement of the right L5 and S1 nerve roots. Mild subarticular foraminal stenosis on the left.  IMPRESSION: Mild canal narrowing at L2-3 unchanged  Mild spinal stenosis L3-4. Subarticular foraminal stenosis on the left at L3-4  Progression of right-sided disc protrusion L4-5 with expected impingement of right L5 nerve root. Mild progression of spinal stenosis right greater than left.  Diffuse endplate spurring right greater than left L5-S1 unchanged with subarticular foraminal stenosis right greater  than left.   Electronically Signed   By: Marlan Palau M.D.   On: 06/23/2017 11:19   She reports that she has never smoked. She has never used smokeless tobacco. No results for input(s): HGBA1C, LABURIC in the last 8760 hours.  Objective:  VS:  HT:    WT:   BMI:     BP:114/66  HR:75bpm  TEMP: ( )  RESP:  Physical Exam  Ortho Exam Imaging: No results found.  Past Medical/Family/Surgical/Social History: Medications & Allergies reviewed per EMR, new medications updated. Patient Active Problem List   Diagnosis Date Noted  . Chronic left-sided low back pain with left-sided sciatica 07/21/2017  . Personal history of calcium pyrophosphate deposition disease (CPPD) 07/29/2016  . History of peripheral neuropathy 07/29/2016  . History of total knee replacement, right 07/22/2016  . Pseudogout 01/22/2016  . Primary osteoarthritis of both knees 01/22/2016  . Primary osteoarthritis of both hands 01/22/2016  . Primary osteoarthritis of both feet 01/22/2016  . Fibromyalgia 01/22/2016  . Primary insomnia 01/22/2016  . Obesity (BMI 30-39.9) 04/01/2014  . Obstructive sleep apnea 04/19/2013  . Essential hypertension, benign 04/19/2013  . Memory loss 08/02/2012   Past Medical History:  Diagnosis Date  . Anxiety   . Fibromyalgia   . GERD (gastroesophageal reflux disease)   . High blood pressure   . HSV (herpes simplex virus) anogenital infection   . IBS (irritable bowel syndrome)   . Memory loss   . Neuropathy   . Obesity (BMI 30-39.9) 04/01/2014  . Osteoarthritis   . Overactive bladder   . Restless leg syndrome   . Sleep  apnea    Family History  Problem Relation Age of Onset  . Depression Mother   . Depression Father   . Mental illness Sister   . Mental illness Sister    Past Surgical History:  Procedure Laterality Date  . APPENDECTOMY    . CATARACT EXTRACTION Left   . CESAREAN SECTION    . CHOLECYSTECTOMY    . colonscopy    . MULTIPLE TOOTH EXTRACTIONS    . ROTATOR  CUFF REPAIR Left   . TOTAL KNEE ARTHROPLASTY     Social History   Occupational History    Comment: retired  Tobacco Use  . Smoking status: Never Smoker  . Smokeless tobacco: Never Used  Substance and Sexual Activity  . Alcohol use: Yes    Alcohol/week: 0.0 oz    Comment: wine occas  . Drug use: No  . Sexual activity: Not on file

## 2017-07-28 NOTE — Procedures (Signed)
Lumbar Epidural Steroid Injection - Interlaminar Approach with Fluoroscopic Guidance  [name]      Date of Birth: 08/04/1940 MRN: [id number] PCP: Blair Heys, MD      Visit Date: 07/17/2017   Universal Protocol:     Consent Given By: the patient  Position: PRONE  Additional Comments: Vital signs were monitored before and after the procedure. Patient was prepped and draped in the usual sterile fashion. The correct patient, procedure, and site was verified.   Injection Procedure Details:  Procedure Site One Meds Administered:  Meds ordered this encounter  Medications  . methylPREDNISolone acetate (DEPO-MEDROL) injection 80 mg     Laterality: Left  Location/Site:  L4-L5  Needle size: 20 G  Needle type: Tuohy  Needle Placement: Paramedian epidural  Findings:   -Comments: Excellent flow of contrast into the epidural space.  Procedure Details: Using a paramedian approach from the side mentioned above, the region overlying the inferior lamina was localized under fluoroscopic visualization and the soft tissues overlying this structure were infiltrated with 4 ml. of 1% Lidocaine without Epinephrine. The Tuohy needle was inserted into the epidural space using a paramedian approach.   The epidural space was localized using loss of resistance along with lateral and bi-planar fluoroscopic views.  After negative aspirate for air, blood, and CSF, a 2 ml. volume of Isovue-250 was injected into the epidural space and the flow of contrast was observed. Radiographs were obtained for documentation purposes.    The injectate was administered into the level noted above.   Additional Comments:  The patient tolerated the procedure well Dressing: Band-Aid    Post-procedure details: Patient was observed during the procedure. Post-procedure instructions were reviewed.  Patient left the clinic in stable condition.

## 2017-08-02 ENCOUNTER — Encounter: Payer: Self-pay | Admitting: Rheumatology

## 2017-08-02 ENCOUNTER — Ambulatory Visit: Payer: Medicare Other | Admitting: Rheumatology

## 2017-08-02 VITALS — BP 114/72 | HR 78 | Resp 12 | Ht 66.0 in | Wt 139.0 lb

## 2017-08-02 DIAGNOSIS — M1120 Other chondrocalcinosis, unspecified site: Secondary | ICD-10-CM

## 2017-08-02 DIAGNOSIS — M7062 Trochanteric bursitis, left hip: Secondary | ICD-10-CM

## 2017-08-02 DIAGNOSIS — M797 Fibromyalgia: Secondary | ICD-10-CM

## 2017-08-02 DIAGNOSIS — M19072 Primary osteoarthritis, left ankle and foot: Secondary | ICD-10-CM

## 2017-08-02 DIAGNOSIS — M1711 Unilateral primary osteoarthritis, right knee: Secondary | ICD-10-CM | POA: Diagnosis not present

## 2017-08-02 DIAGNOSIS — E559 Vitamin D deficiency, unspecified: Secondary | ICD-10-CM

## 2017-08-02 DIAGNOSIS — M19041 Primary osteoarthritis, right hand: Secondary | ICD-10-CM

## 2017-08-02 DIAGNOSIS — Z8659 Personal history of other mental and behavioral disorders: Secondary | ICD-10-CM

## 2017-08-02 DIAGNOSIS — F5101 Primary insomnia: Secondary | ICD-10-CM

## 2017-08-02 DIAGNOSIS — Z8669 Personal history of other diseases of the nervous system and sense organs: Secondary | ICD-10-CM

## 2017-08-02 DIAGNOSIS — Z87898 Personal history of other specified conditions: Secondary | ICD-10-CM

## 2017-08-02 DIAGNOSIS — R269 Unspecified abnormalities of gait and mobility: Secondary | ICD-10-CM

## 2017-08-02 DIAGNOSIS — M19071 Primary osteoarthritis, right ankle and foot: Secondary | ICD-10-CM

## 2017-08-02 DIAGNOSIS — M19042 Primary osteoarthritis, left hand: Secondary | ICD-10-CM

## 2017-08-02 DIAGNOSIS — Z872 Personal history of diseases of the skin and subcutaneous tissue: Secondary | ICD-10-CM

## 2017-08-02 DIAGNOSIS — Z96652 Presence of left artificial knee joint: Secondary | ICD-10-CM

## 2017-08-02 MED ORDER — LIDOCAINE HCL (PF) 1 % IJ SOLN
1.5000 mL | INTRAMUSCULAR | Status: AC | PRN
  Administered 2017-08-02: 1.5 mL

## 2017-08-02 MED ORDER — TRIAMCINOLONE ACETONIDE 40 MG/ML IJ SUSP
40.0000 mg | INTRAMUSCULAR | Status: AC | PRN
  Administered 2017-08-02: 40 mg via INTRA_ARTICULAR

## 2017-08-02 NOTE — Patient Instructions (Signed)
Iliotibial Band Syndrome Rehab  Ask your health care provider which exercises are safe for you. Do exercises exactly as told by your health care provider and adjust them as directed. It is normal to feel mild stretching, pulling, tightness, or discomfort as you do these exercises, but you should stop right away if you feel sudden pain or your pain gets worse. Do not begin these exercises until told by your health care provider.  Stretching and range of motion exercises  These exercises warm up your muscles and joints and improve the movement and flexibility of your hip and pelvis.  Exercise A: Quadriceps, prone    1. Lie on your abdomen on a firm surface, such as a bed or padded floor.  2. Bend your left / right knee and hold your ankle. If you cannot reach your ankle or pant leg, loop a belt around your foot and grab the belt instead.  3. Gently pull your heel toward your buttocks. Your knee should not slide out to the side. You should feel a stretch in the front of your thigh and knee.  4. Hold this position for __________ seconds.  Repeat __________ times. Complete this stretch __________ times a day.  Exercise B: Iliotibial band    1. Lie on your side with your left / right leg in the top position.  2. Bend both of your knees and grab your left / right ankle. Stretch out your bottom arm to help you balance.  3. Slowly bring your top knee back so your thigh goes behind your trunk.  4. Slowly lower your top leg toward the floor until you feel a gentle stretch on the outside of your left / right hip and thigh. If you do not feel a stretch and your knee will not fall farther, place the heel of your other foot on top of your knee and pull your knee down toward the floor with your foot.  5. Hold this position for __________ seconds.  Repeat __________ times. Complete this stretch __________ times a day.  Strengthening exercises  These exercises build strength and endurance in your hip and pelvis. Endurance is the  ability to use your muscles for a long time, even after they get tired.  Exercise C: Straight leg raises (  hip abductors)  1. Lie on your side with your left / right leg in the top position. Lie so your head, shoulder, knee, and hip line up. You may bend your bottom knee to help you balance.  2. Roll your hips slightly forward so your hips are stacked directly over each other and your left / right knee is facing forward.  3. Tense the muscles in your outer thigh and lift your top leg 4-6 inches (10-15 cm).  4. Hold this position for __________ seconds.  5. Slowly return to the starting position. Let your muscles relax completely before doing another repetition.  Repeat __________ times. Complete this exercise __________ times a day.  Exercise D: Straight leg raises (  hip extensors)  1. Lie on your abdomen on your bed or a firm surface. You can put a pillow under your hips if that is more comfortable.  2. Bend your left / right knee so your foot is straight up in the air.  3. Squeeze your buttock muscles and lift your left / right thigh off the bed. Do not let your back arch.  4. Tense this muscle as hard as you can without increasing any knee pain.    5. Hold this position for __________ seconds.  6. Slowly lower your leg to the starting position and allow it to relax completely.  Repeat __________ times. Complete this exercise __________ times a day.  Exercise E: Hip hike  1. Stand sideways on a bottom step. Stand on your left / right leg with your other foot unsupported next to the step. You can hold onto the railing or wall if needed for balance.  2. Keep your knees straight and your torso square. Then, lift your left / right hip up toward the ceiling.  3. Slowly let your left / right hip lower toward the floor, past the starting position. Your foot should get closer to the floor. Do not lean or bend your knees.  Repeat __________ times. Complete this exercise __________ times a day.  This information is not  intended to replace advice given to you by your health care provider. Make sure you discuss any questions you have with your health care provider.  Document Released: 02/14/2005 Document Revised: 10/20/2015 Document Reviewed: 01/16/2015  Elsevier Interactive Patient Education © 2018 [employer]

## 2017-08-30 ENCOUNTER — Encounter: Payer: Self-pay | Admitting: Neurology

## 2017-08-30 ENCOUNTER — Ambulatory Visit (INDEPENDENT_AMBULATORY_CARE_PROVIDER_SITE_OTHER): Payer: Medicare Other | Admitting: Neurology

## 2017-08-30 ENCOUNTER — Other Ambulatory Visit: Payer: Self-pay

## 2017-08-30 ENCOUNTER — Telehealth (INDEPENDENT_AMBULATORY_CARE_PROVIDER_SITE_OTHER): Payer: Self-pay | Admitting: Orthopaedic Surgery

## 2017-08-30 VITALS — BP 115/69 | HR 82 | Resp 18 | Ht 66.0 in | Wt 138.0 lb

## 2017-08-30 DIAGNOSIS — M5416 Radiculopathy, lumbar region: Secondary | ICD-10-CM

## 2017-08-30 DIAGNOSIS — R269 Unspecified abnormalities of gait and mobility: Secondary | ICD-10-CM

## 2017-08-30 NOTE — Progress Notes (Signed)
[name] DOB: Jan 25, 1941  Chief Complaint  Patient presents with  . Left Leg Weakness    New Room. PCP: Blair Heys.  Sees Megan for memory and was just seen in April. MMSE was 24/30.  Sees Ortho for lower back pain with left sided sciatica.  Has had x-rays and had an esi on 07/17/17, with good relief.  Pt. is to call Dr. Alvester Morin back if pain becomes more constant again.  She is here today for eval of left leg weakness/falls.  Last fall was more than 2 wks. ago/fim     HISTORICAL Lynn Price, 77 year old female was referred by her primary care physician Dr. Blair Heys for evaluation of memory trouble. She was last seen in June 2015 by [name] for memory loss  She has a history of fibromyalgia hypertension, anxiety, obstructive sleep apnea using CPAP and short-term memory trouble. She also has a history of bilateral feet paresthesias. 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 She denies significant family history of dementia   MRI of the brain 07/11/2011 shows mild changes of chronic microvascular ischemia and generalized cerebral atrophy.   The patient continues to drive and has gotten lost in unfamiliar surroundings. She continues to be independent with her activities of daily living. She plays golf intermittently otherwise she gets no regular exercise. She has not had any falls, long-standing history of frequency incontinence and on oxybutynin. Her Mini-Mental Status exam is stable. She returns for reevaluation  UPDATE June 6th 2016. She is with her husband at today's clinical visit, she continue has mild slow worsening memory trouble, she continued to drive short distances in a familiar route, she visit her friends, go to church regularly, able to clean house, independent at daily activity,  She also complains of bilateral feet numbness tingling, slow worsening, she is taking gabapentin 300 mg 2 tablets in  the morning 3 tablets every night, does make her feel tired and sleepy,  She has not used her CPAP machine for a while, tends to go to bed late, take long nap in the afternoon,  She also complains of being fatigued, lack of energy  UPDATE August 30 2017:  She is accompanied by her husband at today's clinical visit, she has been followed by our office regularly for memory loss, is referred back by her primary care physician Dr. Beverley Fiedler for evaluation of worsening low back pain, gait abnormality,  She had history of chronic low back pain, gradually getting worse, radiating pain to left lower extremity, bilateral leg numbness, weakness, left worse than right, has tripped and fell few times, is receiving epidural injection which has helped her low back pain, she also complains of occasional knee bowel and bladder incontinence, frequent urinary urgency.  Personally reviewed MRI of lumbar in June 23, 2017, there is evidence of multilevel degenerative changes, progression of right-sided disc protrusion L4-5 with expected impingement of right L5 nerve roots, diffuse endplate spurring, right greater than left L5-S1, are exchanged with subarticular foraminal stenosis, right greater than left  REVIEW OF SYSTEMS: Full 14 system review of systems performed and notable only for as above  ALLERGIES: Allergies  Allergen Reactions  . Erythromycin Rash  . Penicillins Rash  . Sulfa Antibiotics Rash  . Tetracyclines & Related Rash    HOME MEDICATIONS: Current Outpatient Medications  Medication Sig Dispense Refill  . acetaminophen (TYLENOL) 500 MG tablet Take 500 mg by mouth 2 (two) times  daily as needed.    . cetirizine (ZYRTEC) 10 MG tablet Take 10 mg by mouth daily.    . cholecalciferol (VITAMIN D) 1000 units tablet Take 1,000 Units by mouth daily.    Marland Kitchen diltiazem (CARDIZEM CD) 120 MG 24 hr capsule Take 120 mg by mouth daily.    . dorzolamide-timolol (COSOPT) 22.3-6.8 MG/ML ophthalmic  solution   99  . fluticasone (FLONASE) 50 MCG/ACT nasal spray Place into both nostrils daily.    Marland Kitchen gabapentin (NEURONTIN) 300 MG capsule Take 2 capsules (600 mg total) by mouth 2 (two) times daily. 360 capsule 0  . LORazepam (ATIVAN) 1 MG tablet Take 1 mg by mouth 2 (two) times daily as needed for anxiety.    . mometasone (ELOCON) 0.1 % cream   3  . Omega-3 Fatty Acids (FISH OIL) 1200 MG CAPS Take by mouth daily.    . Omeprazole (HM OMEPRAZOLE) 20 MG TBEC Take 20 mg by mouth 2 (two) times daily.     . ondansetron (ZOFRAN) 4 MG tablet   0  . QUEtiapine (SEROQUEL) 25 MG tablet   6  . ranitidine (ZANTAC) 150 MG capsule Take 150 mg by mouth 2 (two) times daily.    . valACYclovir (VALTREX) 500 MG tablet Take 500 mg by mouth 2 (two) times daily.     Marland Kitchen venlafaxine XR (EFFEXOR XR) 150 MG 24 hr capsule Take 150 mg by mouth daily.     No current facility-administered medications for this visit.     PAST MEDICAL HISTORY: Past Medical History:  Diagnosis Date  . Anxiety   . Fibromyalgia   . GERD (gastroesophageal reflux disease)   . High blood pressure   . HSV (herpes simplex virus) anogenital infection   . IBS (irritable bowel syndrome)   . Memory loss   . Neuropathy   . Obesity (BMI 30-39.9) 04/01/2014  . Osteoarthritis   . Overactive bladder   . Restless leg syndrome   . Sleep apnea     PAST SURGICAL HISTORY: Past Surgical History:  Procedure Laterality Date  . APPENDECTOMY    . CATARACT EXTRACTION Left   . CESAREAN SECTION    . CHOLECYSTECTOMY    . colonscopy    . MULTIPLE TOOTH EXTRACTIONS    . ROTATOR CUFF REPAIR Left   . TOTAL KNEE ARTHROPLASTY      FAMILY HISTORY: Family History  Problem Relation Age of Onset  . Depression Mother   . Depression Father   . Mental illness Sister   . Mental illness Sister     SOCIAL HISTORY:  Social History   Socioeconomic History  . Marital status: Married    Spouse name: Dimas Aguas  . Number of children: 2  . Years of education:  college  . Highest education level: Not on file  Occupational History    Comment: retired  Engineer, production  . Financial resource strain: Not on file  . Food insecurity:    Worry: Not on file    Inability: Not on file  . Transportation needs:    Medical: Not on file    Non-medical: Not on file  Tobacco Use  . Smoking status: Never Smoker  . Smokeless tobacco: Never Used  Substance and Sexual Activity  . Alcohol use: Yes    Comment: wine occas  . Drug use: No  . Sexual activity: Not on file  Lifestyle  . Physical activity:    Days per week: Not on file    Minutes per session:  Not on file  . Stress: Not on file  Relationships  . Social connections:    Talks on phone: Not on file    Gets together: Not on file    Attends religious service: Not on file    Active member of club or organization: Not on file    Attends meetings of clubs or organizations: Not on file    Relationship status: Not on file  . Intimate partner violence:    Fear of current or ex partner: Not on file    Emotionally abused: Not on file    Physically abused: Not on file    Forced sexual activity: Not on file  Other Topics Concern  . Not on file  Social History Narrative   Patient lives at home with her husband Dimas Aguas) and she is retired. Patient has 2 years of college. Patient has 2 children.      PHYSICAL EXAM   Vitals:   08/30/17 1032  BP: 115/69  Pulse: 82  Resp: 18  Weight: 138 lb (62.6 kg)  Height: 5\' 6"  (1.676 m)    Not recorded      Body mass index is 22.27 kg/m.  PHYSICAL EXAMNIATION:  Gen: NAD, conversant, well nourised, obese, well groomed                     Cardiovascular: Regular rate rhythm, no peripheral edema, warm, nontender. Eyes: Conjunctivae clear without exudates or hemorrhage Neck: Supple, no carotid bruits. Pulmonary: Clear to auscultation bilaterally   NEUROLOGICAL EXAM:  MENTAL STATUS: Speech:    Speech is normal; fluent and spontaneous with normal  comprehension.  Cognition:     Orientation to time, place and person     Normal recent and remote memory     Normal Attention span and concentration     Normal Language, naming, repeating,spontaneous speech     Fund of knowledge   CRANIAL NERVES: CN II: Visual fields are full to confrontation. Fundoscopic exam is normal with sharp discs and no vascular changes. Pupils are round equal and briskly reactive to light. CN III, IV, VI: extraocular movement are normal. No ptosis. CN V: Facial sensation is intact to pinprick in all 3 divisions bilaterally. Corneal responses are intact.  CN VII: Face is symmetric with normal eye closure and smile. CN VIII: Hearing is normal to rubbing fingers CN IX, X: Palate elevates symmetrically. Phonation is normal. CN XI: Head turning and shoulder shrug are intact CN XII: Tongue is midline with normal movements and no atrophy.  MOTOR: There is no pronator drift of out-stretched arms. Muscle bulk and tone are normal. Muscle strength is normal.  REFLEXES: Reflexes are 2+ and symmetric at the biceps, triceps, knees, and ankles. Plantar responses are flexor.  SENSORY: Intact to light touch, pinprick, positional sensation and vibratory sensation are intact in fingers and toes.  COORDINATION: Rapid alternating movements and fine finger movements are intact. There is no dysmetria on finger-to-nose and heel-knee-shin.    GAIT/STANCE: Posture is normal. Gait is steady with normal steps, base, arm swing, and turning. Heel and toe walking are normal. Tandem gait is normal.  Romberg is absent.   DIAGNOSTIC DATA (LABS, IMAGING, TESTING) - I reviewed patient records, labs, notes, testing and imaging myself where available.   ASSESSMENT AND PLAN  Lynn Price is a 77 y.o. female   Mild cognitive impairment Bilateral lumbar radiculopathy  Not a candidate for surgery at this point,  I have suggested physical therapy,  gabapentin as needed  Continue  epidural injection   Levert FeinsteinYijun Eliya Bubar, M.D. Ph.D.  [facility] [address], [address] [place], [place] [address] Ph: [phone] Fax: [phone]  CC:Blair HeysEhinger, Robert, MD

## 2017-08-30 NOTE — Telephone Encounter (Signed)
Patient did not get to go to Physical Therapy back in April for her neck , and back. Patient would like to start Therapy now if possible. Patient request a new referral be sent to Therapy now if needed. Please call patient to advise if order was sent over, and to which location.

## 2017-09-01 ENCOUNTER — Other Ambulatory Visit (INDEPENDENT_AMBULATORY_CARE_PROVIDER_SITE_OTHER): Payer: Self-pay | Admitting: Radiology

## 2017-09-01 NOTE — Telephone Encounter (Signed)
NOTIFIED PT REFERRAL WAS AUTHORIZED FOR PT AND TO CONTACT [facility] ON [address] TO SET UP APPT.

## 2017-10-04 ENCOUNTER — Other Ambulatory Visit: Payer: Self-pay

## 2017-10-04 ENCOUNTER — Encounter: Payer: Self-pay | Admitting: Physical Therapy

## 2017-10-04 ENCOUNTER — Ambulatory Visit: Payer: Medicare Other | Attending: Orthopaedic Surgery | Admitting: Physical Therapy

## 2017-10-04 DIAGNOSIS — G8929 Other chronic pain: Secondary | ICD-10-CM | POA: Insufficient documentation

## 2017-10-04 DIAGNOSIS — M542 Cervicalgia: Secondary | ICD-10-CM | POA: Diagnosis present

## 2017-10-04 DIAGNOSIS — R296 Repeated falls: Secondary | ICD-10-CM | POA: Diagnosis present

## 2017-10-04 DIAGNOSIS — M545 Low back pain, unspecified: Secondary | ICD-10-CM

## 2017-10-04 NOTE — Therapy (Signed)
[facility] [address] [place] [address] [place], [place], [address] Phone: [phone]   Fax:  [phone]  Physical Therapy Evaluation  Patient Details  Name: Lynn Price MRN: [id number] Date of Birth: 10/15/1940 Referring Provider: Valeria Batman, MD   Encounter Date: 10/04/2017  PT End of Session - 10/04/17 1634    Visit Number  1    Number of Visits  17    Date for PT Re-Evaluation  12/01/17    Authorization Type  [employer] MCR    PN at visit 10, KX at visit 15    PT Start Time  1629    PT Stop Time  1718    PT Time Calculation (min)  49 min       Past Medical History:  Diagnosis Date  . Anxiety   . Fibromyalgia   . GERD (gastroesophageal reflux disease)   . High blood pressure   . HSV (herpes simplex virus) anogenital infection   . IBS (irritable bowel syndrome)   . Memory loss   . Neuropathy   . Obesity (BMI 30-39.9) 04/01/2014  . Osteoarthritis   . Overactive bladder   . Restless leg syndrome   . Sleep apnea     Past Surgical History:  Procedure Laterality Date  . APPENDECTOMY    . CATARACT EXTRACTION Left   . CESAREAN SECTION    . CHOLECYSTECTOMY    . colonscopy    . MULTIPLE TOOTH EXTRACTIONS    . ROTATOR CUFF REPAIR Left   . TOTAL KNEE ARTHROPLASTY      There were no vitals filed for this visit.   Subjective Assessment - 10/04/17 1643    Subjective  It just hurts a lot. I have had several falls (from my legs giving out) that have probably contributed. Back pain started in high school and has been off/on. I was backing out of driveway one day and looked Lt to see what was behind and got a large pull sensation in her neck. Picking up objects greater than 5lb is painful. A few years ago played golf and tennis frequently.     How long can you sit comfortably?  a few min and then will feel back pain    How long can you walk comfortably?  about 1 mile    Patient Stated Goals  2 steps at home with one railing, turn  head side/side, bending, housework    Currently in Pain?  Yes    Pain Score  5     Pain Location  Back    Pain Orientation  Lower;Right;Left    Pain Descriptors / Indicators  Aching;Tightness    Pain Radiating Towards  reports occasional tingling in toes, sore/aching feeling sometimes in her legs    Aggravating Factors   one position for too long, turning head to end range    Pain Relieving Factors  lay supine         [facility] PT Assessment - 10/04/17 0001      Assessment   Medical Diagnosis  LBP, cervicalgia    Referring Provider  Valeria Batman, MD    Hand Dominance  Right    Next MD Visit  -- PRN    Prior Therapy  none      Precautions   Precautions  Fall      Balance Screen   Has the patient fallen in the past 6 months  Yes    How many times?  5 approx  Has the patient had a decrease in activity level because of a fear of falling?   Yes    Is the patient reluctant to leave their home because of a fear of falling?   No      Home Environment   Additional Comments  2 steps to enter home with one hand rail      Prior Function   Level of Independence  Independent    Vocation Requirements  not working      Cognition   Overall Cognitive Status  Within Functional Limits for tasks assessed      Observation/Other Assessments   Focus on Therapeutic Outcomes (FOTO)   56% limited      Sensation   Additional Comments  reports occasional tingling in toes      Posture/Postural Control   Posture Comments  kypotic with forward rounded shoulders, forward head      ROM / Strength   AROM / PROM / Strength  AROM;Strength      AROM   AROM Assessment Site  Cervical    Cervical Flexion  40    Cervical Extension  45    Cervical - Right Side Bend  22    Cervical - Left Side Bend  36    Cervical - Right Rotation  67    Cervical - Left Rotation  67      Strength   Overall Strength Comments  gross UE 4-/5    Strength Assessment Site  Shoulder;Hip    Right/Left Shoulder   Right;Left    Right/Left Hip  Right;Left    Right Hip Flexion  4-/5    Right Hip ABduction  4-/5    Left Hip Flexion  4-/5    Left Hip ABduction  4-/5                Objective measurements completed on examination: See above findings.              PT Education - 10/04/17 1635    Education Details  anatomy of condition, POC, HEP, exercise form/rationale, time required to assist in completion of FOTO    Person(s) Educated  Patient;Spouse    Methods  Explanation;Demonstration;Tactile cues;Verbal cues;Handout    Comprehension  Verbalized understanding;Returned demonstration;Verbal cues required;Tactile cues required;Need further instruction       PT Short Term Goals - 10/04/17 2110      PT SHORT TERM GOAL #1   Title  Pt and husband will report compliance with HEP    Baseline  began establishing at eval    Time  4    Period  Weeks    Status  New    Target Date  11/03/17      PT SHORT TERM GOAL #2   Title  Pt will demo proper use of SPC and verbalize comfort and safety during daily activities    Baseline  will educate and challenge as appropriate    Time  4    Period  Weeks    Status  New    Target Date  11/03/17      PT SHORT TERM GOAL #3   Title  pt will demo proper upright posture with minimal cuing    Baseline  verbal and tactile cues required at eval    Time  4    Period  Weeks    Status  New    Target Date  11/03/17        PT Long Term Goals -  10/04/17 2112      PT LONG TERM GOAL #1   Title  BERG score to improve by appropriate MDC    Baseline  to be tested at next visit, MDC depending on score    Time  8    Period  Weeks    Status  New    Target Date  12/01/17      PT LONG TERM GOAL #2   Title  Pt will demo safety on steps/stairs with minimal UE support    Baseline  multiple falls on steps to enter home    Time  8    Period  Weeks    Status  New    Target Date  12/01/17      PT LONG TERM GOAL #3   Title  Pt will be able to drive  without limitation by cervical pain    Baseline  pain when looking behind or over her shoulder    Time  8    Period  Weeks    Status  New    Target Date  12/01/17      PT LONG TERM GOAL #4   Title  Pt will be able to bend to lift objects such as laundry basket without limitation by back/neck pain    Baseline  pain at eval    Time  8    Period  Weeks    Status  New    Target Date  12/01/17      PT LONG TERM GOAL #5   Title  Pt will be able to complete household chores with pain <=2/10 and verbalize proper safety precautions to avoid falls    Baseline  unable to complete desired household chores at eval    Time  8    Period  Weeks    Status  New    Target Date  12/01/17             Plan - 10/04/17 1638    Clinical Impression Statement  Pt presents to PT with complaints of neck and low back pain that is chronic. Pt and husband report at least 5 falls in the last 6 months contributing to pain. Large amount of time required today to complete FOTO questionairre. Pt will benefit from skilled PT to train and strengthen proper posture, decrease pain and improve balance to decrease future falls. PT asked pt to begin using SPC full time as she is not using it at all at this time.     History and Personal Factors relevant to plan of care:  fibromyalgia, anxiety, memory loss, OA    Clinical Presentation  Evolving    Clinical Presentation due to:  multiple falls and poor balance    Clinical Decision Making  Moderate    Rehab Potential  Good    PT Frequency  2x / week    PT Duration  8 weeks    PT Treatment/Interventions  ADLs/Self Care Home Management;Cryotherapy;Electrical Stimulation;Moist Heat;Traction;Therapeutic exercise;Therapeutic activities;Functional mobility training;Stair training;Gait training;Balance training;Neuromuscular re-education;Patient/family education;Manual techniques;Taping;Dry needling;Passive range of motion    PT Next Visit Plan  BERG, did she use SPC?     PT  Home Exercise Plan  scapular retraction, LTR    Consulted and Agree with Plan of Care  Patient;Family member/caregiver    Family Member Consulted  husband       Patient will benefit from skilled therapeutic intervention in order to improve the following deficits and impairments:  Abnormal gait, Decreased endurance,  Decreased activity tolerance, Decreased strength, Pain, Increased muscle spasms, Decreased balance, Decreased range of motion, Impaired flexibility, Postural dysfunction  Visit Diagnosis: Chronic bilateral low back pain without sciatica  Cervicalgia  Repeated falls     Problem List Patient Active Problem List   Diagnosis Date Noted  . Gait abnormality 08/30/2017  . Lumbar radiculopathy 08/30/2017  . Chronic left-sided low back pain with left-sided sciatica 07/21/2017  . Personal history of calcium pyrophosphate deposition disease (CPPD) 07/29/2016  . History of peripheral neuropathy 07/29/2016  . History of total knee replacement, right 07/22/2016  . Pseudogout 01/22/2016  . Primary osteoarthritis of both knees 01/22/2016  . Primary osteoarthritis of both hands 01/22/2016  . Primary osteoarthritis of both feet 01/22/2016  . Fibromyalgia 01/22/2016  . Primary insomnia 01/22/2016  . Obesity (BMI 30-39.9) 04/01/2014  . Obstructive sleep apnea 04/19/2013  . Essential hypertension, benign 04/19/2013  . Memory loss 08/02/2012   Maudene Stotler C. Auri Jahnke PT, DPT 10/04/17 9:33 PM   [facility] [address] [place], [place], [address] Phone: [phone]   Fax:  [phone]  Name: Lowanda Fosterlaine C Moorman MRN: [id number] Date of Birth: December 09, 1940

## 2017-10-09 ENCOUNTER — Ambulatory Visit: Payer: Medicare Other | Admitting: Physical Therapy

## 2017-10-09 ENCOUNTER — Encounter: Payer: Self-pay | Admitting: Physical Therapy

## 2017-10-09 DIAGNOSIS — M545 Low back pain, unspecified: Secondary | ICD-10-CM

## 2017-10-09 DIAGNOSIS — M542 Cervicalgia: Secondary | ICD-10-CM

## 2017-10-09 DIAGNOSIS — R296 Repeated falls: Secondary | ICD-10-CM

## 2017-10-09 DIAGNOSIS — G8929 Other chronic pain: Secondary | ICD-10-CM

## 2017-10-09 NOTE — Therapy (Signed)
[facility] [address] [place], [place], [address] Phone: [phone]   Fax:  [phone]  Physical Therapy Treatment  Patient Details  Name: [place] Price MRN: [id number] Date of Birth: [birth year] Referring Provider: Valeria Batman, MD   Encounter Date: 10/09/2017  PT End of Session - 10/09/17 1359    Visit Number  2    Number of Visits  17    Date for PT Re-Evaluation  12/01/17    Authorization Type  [employer] MCR    PN at visit 10, KX at visit 15    PT Start Time  1400    PT Stop Time  1442    PT Time Calculation (min)  42 min    Activity Tolerance  Patient tolerated treatment well    Behavior During Therapy  [facility] for tasks assessed/performed       Past Medical History:  Diagnosis Date  . Anxiety   . Fibromyalgia   . GERD (gastroesophageal reflux disease)   . High blood pressure   . HSV (herpes simplex virus) anogenital infection   . IBS (irritable bowel syndrome)   . Memory loss   . Neuropathy   . Obesity (BMI 30-39.9) 04/01/2014  . Osteoarthritis   . Overactive bladder   . Restless leg syndrome   . Sleep apnea     Past Surgical History:  Procedure Laterality Date  . APPENDECTOMY    . CATARACT EXTRACTION Left   . CESAREAN SECTION    . CHOLECYSTECTOMY    . colonscopy    . MULTIPLE TOOTH EXTRACTIONS    . ROTATOR CUFF REPAIR Left   . TOTAL KNEE ARTHROPLASTY      There were no vitals filed for this visit.  Subjective Assessment - 10/09/17 1359    Subjective  SPC feels awkward. Denies pain at the moment.     Patient Stated Goals  2 steps at home with one railing, turn head side/side, bending, housework    Currently in Pain?  No/denies         [facility] PT Assessment - 10/09/17 0001      Standardized Balance Assessment   Standardized Balance Assessment  Berg Balance Test      Berg Balance Test   Sit to Stand  Able to stand without using hands and stabilize independently    Standing Unsupported  Able  to stand safely 2 minutes    Sitting with Back Unsupported but Feet Supported on Floor or Stool  Able to sit safely and securely 2 minutes    Stand to Sit  Sits safely with minimal use of hands    Transfers  Able to transfer safely, minor use of hands    Standing Unsupported with Eyes Closed  Able to stand 10 seconds safely    Standing Ubsupported with Feet Together  Able to place feet together independently and stand 1 minute safely    From Standing, Reach Forward with Outstretched Arm  Can reach forward >12 cm safely (5")    From Standing Position, Pick up Object from Floor  Able to pick up shoe safely and easily    From Standing Position, Turn to Look Behind Over each Shoulder  Looks behind from both sides and weight shifts well    Turn 360 Degrees  Able to turn 360 degrees safely one side only in 4 seconds or less    Standing Unsupported, Alternately Place Feet on Step/Stool  Able to stand independently and complete 8 steps >  20 seconds    Standing Unsupported, One Foot in Front  Able to plae foot ahead of the other independently and hold 30 seconds    Standing on One Leg  Able to lift leg independently and hold 5-10 seconds    Total Score  51      High Level Balance   High Level Balance Comments  narrow step width-shoes rubbing, loss of control with head turns, mild change in gait speed with cuing.                    [facility] Adult PT Treatment/Exercise - 10/09/17 0001      Ambulation/Gait   Gait Comments  training with SPC, increased step width      Therapeutic Activites    Therapeutic Activities  ADL's    AD[facility]  [facility]      Exercises   Exercises  Knee/Hip      Knee/Hip Exercises: Stretches   Other Knee/Hip Stretches  LTR x2 each      Knee/Hip Exercises: Aerobic   Nustep  3 min L4 LE only      Knee/Hip Exercises: Supine   Bridges  Both;15 reps      Knee/Hip Exercises: Sidelying   Clams  x20 each               PT Short Term Goals - 10/04/17  2110      PT SHORT TERM GOAL #1   Title  Pt and husband will report compliance with HEP    Baseline  began establishing at eval    Time  4    Period  Weeks    Status  New    Target Date  11/03/17      PT SHORT TERM GOAL #2   Title  Pt will demo proper use of SPC and verbalize comfort and safety during daily activities    Baseline  will educate and challenge as appropriate    Time  4    Period  Weeks    Status  New    Target Date  11/03/17      PT SHORT TERM GOAL #3   Title  pt will demo proper upright posture with minimal cuing    Baseline  verbal and tactile cues required at eval    Time  4    Period  Weeks    Status  New    Target Date  11/03/17        PT Long Term Goals - 10/04/17 2112      PT LONG TERM GOAL #1   Title  BERG score to improve by appropriate MDC    Baseline  to be tested at next visit, MDC depending on score    Time  8    Period  Weeks    Status  New    Target Date  12/01/17      PT LONG TERM GOAL #2   Title  Pt will demo safety on steps/stairs with minimal UE support    Baseline  multiple falls on steps to enter home    Time  8    Period  Weeks    Status  New    Target Date  12/01/17      PT LONG TERM GOAL #3   Title  Pt will be able to drive without limitation by cervical pain    Baseline  pain when looking behind or over her shoulder    Time  8    Period  Weeks    Status  New    Target Date  12/01/17      PT LONG TERM GOAL #4   Title  Pt will be able to bend to lift objects such as laundry basket without limitation by back/neck pain    Baseline  pain at eval    Time  8    Period  Weeks    Status  New    Target Date  12/01/17      PT LONG TERM GOAL #5   Title  Pt will be able to complete household chores with pain <=2/10 and verbalize proper safety precautions to avoid falls    Baseline  unable to complete desired household chores at eval    Time  8    Period  Weeks    Status  New    Target Date  12/01/17             Plan - 10/09/17 1442    Clinical Impression Statement  Pt tends to ambulate with narrow step width resulting in rubbing of shoes together and a trip hazzard. Gilmer MorCane she brought today was a little too long and husband said he can cut it to fit her- fit was discussed with training. Good BERG score but poor control in dynamic walking challenges including head turns and speed changes. Pt is nervous on stairs and reports she often navigates home steps with hands full. will cont to challenge gross strength and functional/dynamic balance to reduce back pain and fall risk.     PT Treatment/Interventions  ADLs/Self Care Home Management;Cryotherapy;Electrical Stimulation;Moist Heat;Traction;Therapeutic exercise;Therapeutic activities;Functional mobility training;Stair training;Gait training;Balance training;Neuromuscular re-education;Patient/family education;Manual techniques;Taping;Dry needling;Passive range of motion    PT Next Visit Plan  airex/wobble board balance, begin periscapular strengthening    PT Home Exercise Plan  scapular retraction, LTR, bridge, clam    Consulted and Agree with Plan of Care  Patient;Family member/caregiver    Family Member Consulted  husband       Patient will benefit from skilled therapeutic intervention in order to improve the following deficits and impairments:  Abnormal gait, Decreased endurance, Decreased activity tolerance, Decreased strength, Pain, Increased muscle spasms, Decreased balance, Decreased range of motion, Impaired flexibility, Postural dysfunction  Visit Diagnosis: Chronic bilateral low back pain without sciatica  Cervicalgia  Repeated falls     Problem List Patient Active Problem List   Diagnosis Date Noted  . Gait abnormality 08/30/2017  . Lumbar radiculopathy 08/30/2017  . Chronic left-sided low back pain with left-sided sciatica 07/21/2017  . Personal history of calcium pyrophosphate deposition disease (CPPD) 07/29/2016  .  History of peripheral neuropathy 07/29/2016  . History of total knee replacement, right 07/22/2016  . Pseudogout 01/22/2016  . Primary osteoarthritis of both knees 01/22/2016  . Primary osteoarthritis of both hands 01/22/2016  . Primary osteoarthritis of both feet 01/22/2016  . Fibromyalgia 01/22/2016  . Primary insomnia 01/22/2016  . Obesity (BMI 30-39.9) 04/01/2014  . Obstructive sleep apnea 04/19/2013  . Essential hypertension, benign 04/19/2013  . Memory loss 08/02/2012    Malisa Ruggiero C. Teriana Danker PT, DPT 10/09/17 2:45 PM   [facility] [address] [place], [place], [address] Phone: [phone]   Fax:  [phone]  Name: [place] Price MRN: [id number] Date of Birth: 08/06/1940

## 2017-10-11 ENCOUNTER — Ambulatory Visit: Payer: Medicare Other | Admitting: Physical Therapy

## 2017-10-11 ENCOUNTER — Encounter: Payer: Self-pay | Admitting: Physical Therapy

## 2017-10-11 DIAGNOSIS — R296 Repeated falls: Secondary | ICD-10-CM

## 2017-10-11 DIAGNOSIS — M545 Low back pain, unspecified: Secondary | ICD-10-CM

## 2017-10-11 DIAGNOSIS — M542 Cervicalgia: Secondary | ICD-10-CM

## 2017-10-11 DIAGNOSIS — G8929 Other chronic pain: Secondary | ICD-10-CM

## 2017-10-11 NOTE — Therapy (Signed)
[facility] [address] [place], [place], [address] Phone: [phone]   Fax:  [phone]  Physical Therapy Treatment  Patient Details  Name: Lynn Price MRN: [id number] Date of Birth: [birth year] Referring Provider: Valeria BatmanWhitfield, Peter W, MD   Encounter Date: 10/11/2017  PT End of Session - 10/11/17 1532    Visit Number  3    Number of Visits  17    Date for PT Re-Evaluation  12/01/17    Authorization Type  [employer] MCR    PN at visit 10, KX at visit 15    PT Start Time  1450    PT Stop Time  1532    PT Time Calculation (min)  42 min    Activity Tolerance  Patient tolerated treatment well    Behavior During Therapy  [facility] for tasks assessed/performed       Past Medical History:  Diagnosis Date  . Anxiety   . Fibromyalgia   . GERD (gastroesophageal reflux disease)   . High blood pressure   . HSV (herpes simplex virus) anogenital infection   . IBS (irritable bowel syndrome)   . Memory loss   . Neuropathy   . Obesity (BMI 30-39.9) 04/01/2014  . Osteoarthritis   . Overactive bladder   . Restless leg syndrome   . Sleep apnea     Past Surgical History:  Procedure Laterality Date  . APPENDECTOMY    . CATARACT EXTRACTION Left   . CESAREAN SECTION    . CHOLECYSTECTOMY    . colonscopy    . MULTIPLE TOOTH EXTRACTIONS    . ROTATOR CUFF REPAIR Left   . TOTAL KNEE ARTHROPLASTY      There were no vitals filed for this visit.  Subjective Assessment - 10/11/17 1451    Subjective  Back is feeling sore today, both legs are sore.     Patient Stated Goals  2 steps at home with one railing, turn head side/side, bending, housework    Currently in Pain?  Yes    Pain Score  5     Pain Location  Back    Pain Orientation  Lower;Right;Left    Pain Descriptors / Indicators  Sore                       [facility] Adult PT Treatment/Exercise - 10/11/17 0001      Exercises   Exercises  Knee/Hip;Shoulder      Knee/Hip  Exercises: Stretches   Other Knee/Hip Stretches  LTR      Knee/Hip Exercises: Standing   Heel Raises Limitations  alt heel/toe raises    Other Standing Knee Exercises  gait- speed, head turns      Knee/Hip Exercises: Supine   Straight Leg Raises  Both;10 reps    Straight Leg Raise with External Rotation  Both;10 reps      Shoulder Exercises: Supine   Horizontal ABduction  10 reps;Theraband    Theraband Level (Shoulder Horizontal ABduction)  Level 2 (Red)    Flexion  10 reps    Theraband Level (Shoulder Flexion)  Level 2 (Red)    Flexion Limitations  isometric horiz abd pull on tband to shoulder width    Other Supine Exercises  chin tuck      Shoulder Exercises: Stretch   Table Stretch - Flexion  5 reps;10 seconds    Table Stretch -Flexion Limitations  with cane  PT Short Term Goals - 10/04/17 2110      PT SHORT TERM GOAL #1   Title  Pt and husband will report compliance with HEP    Baseline  began establishing at eval    Time  4    Period  Weeks    Status  New    Target Date  11/03/17      PT SHORT TERM GOAL #2   Title  Pt will demo proper use of SPC and verbalize comfort and safety during daily activities    Baseline  will educate and challenge as appropriate    Time  4    Period  Weeks    Status  New    Target Date  11/03/17      PT SHORT TERM GOAL #3   Title  pt will demo proper upright posture with minimal cuing    Baseline  verbal and tactile cues required at eval    Time  4    Period  Weeks    Status  New    Target Date  11/03/17        PT Long Term Goals - 10/04/17 2112      PT LONG TERM GOAL #1   Title  BERG score to improve by appropriate MDC    Baseline  to be tested at next visit, MDC depending on score    Time  8    Period  Weeks    Status  New    Target Date  12/01/17      PT LONG TERM GOAL #2   Title  Pt will demo safety on steps/stairs with minimal UE support    Baseline  multiple falls on steps to enter home     Time  8    Period  Weeks    Status  New    Target Date  12/01/17      PT LONG TERM GOAL #3   Title  Pt will be able to drive without limitation by cervical pain    Baseline  pain when looking behind or over her shoulder    Time  8    Period  Weeks    Status  New    Target Date  12/01/17      PT LONG TERM GOAL #4   Title  Pt will be able to bend to lift objects such as laundry basket without limitation by back/neck pain    Baseline  pain at eval    Time  8    Period  Weeks    Status  New    Target Date  12/01/17      PT LONG TERM GOAL #5   Title  Pt will be able to complete household chores with pain <=2/10 and verbalize proper safety precautions to avoid falls    Baseline  unable to complete desired household chores at eval    Time  8    Period  Weeks    Status  New    Target Date  12/01/17            Plan - 10/11/17 1533    Clinical Impression Statement  Added to HEP for strengthening of periscapular region and deep neck flexors. Notable LOB when ambulating and turning her head to the Lt. Pt is going out of town first week of sept to the beach- advised walking on sand is good exercise, she just needs to be safe. Reports soreness was reduced with  treatment today.     PT Treatment/Interventions  ADLs/Self Care Home Management;Cryotherapy;Electrical Stimulation;Moist Heat;Traction;Therapeutic exercise;Therapeutic activities;Functional mobility training;Stair training;Gait training;Balance training;Neuromuscular re-education;Patient/family education;Manual techniques;Taping;Dry needling;Passive range of motion    PT Next Visit Plan  airex/wobble board balance, cont periscap strengthening    PT Home Exercise Plan  scapular retraction, LTR, bridge, clam, chin tucks, supine horiz abd, supine GHJ flx stretch    Consulted and Agree with Plan of Care  Patient;Family member/caregiver    Family Member Consulted  husband       Patient will benefit from skilled therapeutic  intervention in order to improve the following deficits and impairments:  Abnormal gait, Decreased endurance, Decreased activity tolerance, Decreased strength, Pain, Increased muscle spasms, Decreased balance, Decreased range of motion, Impaired flexibility, Postural dysfunction  Visit Diagnosis: Chronic bilateral low back pain without sciatica  Cervicalgia  Repeated falls     Problem List Patient Active Problem List   Diagnosis Date Noted  . Gait abnormality 08/30/2017  . Lumbar radiculopathy 08/30/2017  . Chronic left-sided low back pain with left-sided sciatica 07/21/2017  . Personal history of calcium pyrophosphate deposition disease (CPPD) 07/29/2016  . History of peripheral neuropathy 07/29/2016  . History of total knee replacement, right 07/22/2016  . Pseudogout 01/22/2016  . Primary osteoarthritis of both knees 01/22/2016  . Primary osteoarthritis of both hands 01/22/2016  . Primary osteoarthritis of both feet 01/22/2016  . Fibromyalgia 01/22/2016  . Primary insomnia 01/22/2016  . Obesity (BMI 30-39.9) 04/01/2014  . Obstructive sleep apnea 04/19/2013  . Essential hypertension, benign 04/19/2013  . Memory loss 08/02/2012   Luismanuel Corman C. Recie Cirrincione PT, DPT 10/11/17 3:36 PM   [facility] [address] [place], [place], [address] Phone: [phone]   Fax:  [phone]  Name: Lynn Price MRN: [id number] Date of Birth: [birth year]

## 2017-10-16 ENCOUNTER — Ambulatory Visit: Payer: Medicare Other | Admitting: Physical Therapy

## 2017-10-16 DIAGNOSIS — M542 Cervicalgia: Secondary | ICD-10-CM

## 2017-10-16 DIAGNOSIS — M545 Low back pain, unspecified: Secondary | ICD-10-CM

## 2017-10-16 DIAGNOSIS — R296 Repeated falls: Secondary | ICD-10-CM

## 2017-10-16 DIAGNOSIS — G8929 Other chronic pain: Secondary | ICD-10-CM

## 2017-10-16 NOTE — Patient Instructions (Signed)
Walking YOGA Style  When walking.  Be aware if your inner thighs. When one thigh is forward,  Move it behind you. Repeat  Start with small distances and progress to longer distances.

## 2017-10-16 NOTE — Therapy (Signed)
[facility] [address] [place], [place], [address] Phone: [phone]   Fax:  [phone]  Physical Therapy Treatment  Patient Details  Name: Lynn Price MRN: [id number] Date of Birth: [birth year] Referring Provider: Valeria BatmanWhitfield, Peter W, MD   Encounter Date: 10/16/2017  PT End of Session - 10/16/17 1748    Visit Number  4    Number of Visits  17    Date for PT Re-Evaluation  12/01/17    PT Start Time  1635    PT Stop Time  1728    PT Time Calculation (min)  53 min    Activity Tolerance  Patient tolerated treatment well    Behavior During Therapy  [facility] for tasks assessed/performed       Past Medical History:  Diagnosis Date  . Anxiety   . Fibromyalgia   . GERD (gastroesophageal reflux disease)   . High blood pressure   . HSV (herpes simplex virus) anogenital infection   . IBS (irritable bowel syndrome)   . Memory loss   . Neuropathy   . Obesity (BMI 30-39.9) 04/01/2014  . Osteoarthritis   . Overactive bladder   . Restless leg syndrome   . Sleep apnea     Past Surgical History:  Procedure Laterality Date  . APPENDECTOMY    . CATARACT EXTRACTION Left   . CESAREAN SECTION    . CHOLECYSTECTOMY    . colonscopy    . MULTIPLE TOOTH EXTRACTIONS    . ROTATOR CUFF REPAIR Left   . TOTAL KNEE ARTHROPLASTY      There were no vitals filed for this visit.  Subjective Assessment - 10/16/17 1638    Subjective  Taking Seraquil at night,  She doies not know the dosage so did not add to Medication list.    PT is working.  I have more energy and I feel better.  I walk ion the sidewalk.  i feel muy right leg is not as sturdy as my left.      Patient is accompained by:  Family member    Pain Score  0-No pain   can get to 6-7/10   Pain Location  Back    Pain Orientation  Lower    Aggravating Factors   laundry cooking  getting up in the morning     Pain Relieving Factors  resting.                        [facility] Adult PT Treatment/Exercise - 10/16/17 0001      Ambulation/Gait   Assistive device  Straight cane    Gait Comments  cued for several techniques.  YOGA walking was able to increase trunk rotation and improve walking balance.      High Level Balance   High Level Balance Comments  t counter walking forward/ reverse and side, on airex wide and narrow base.  static and dynamic challanges,     moderate pai with rotation xercises in low back hips.      Knee/Hip Exercises: Aerobic   Nustep  L4 UE/LE X 6 minutes      Knee/Hip Exercises: Supine   Straight Leg Raises  Both;10 reps      Knee/Hip Exercises: Sidelying   Clams  10 x each,  cues,  left most difficult      Shoulder Exercises: Supine   Theraband Level (Shoulder Horizontal ABduction)  Level 2 (Red)    Flexion  10 reps  Theraband Level (Shoulder Flexion)  Level 2 (Red)    Flexion Limitations  3  X10 isometric horizontal abduction    Other Supine Exercises  ER both 7/10 pain right brief             PT Education - 10/16/17 1744    Education Details  HEP,  gait    Person(s) Educated  Spouse    Methods  Demonstration;Tactile cues;Verbal cues;Handout    Comprehension  Verbalized understanding;Returned demonstration       PT Short Term Goals - 10/04/17 2110      PT SHORT TERM GOAL #1   Title  Pt and husband will report compliance with HEP    Baseline  began establishing at eval    Time  4    Period  Weeks    Status  New    Target Date  11/03/17      PT SHORT TERM GOAL #2   Title  Pt will demo proper use of SPC and verbalize comfort and safety during daily activities    Baseline  will educate and challenge as appropriate    Time  4    Period  Weeks    Status  New    Target Date  11/03/17      PT SHORT TERM GOAL #3   Title  pt will demo proper upright posture with minimal cuing    Baseline  verbal and tactile cues required at eval    Time  4    Period  Weeks    Status   New    Target Date  11/03/17        PT Long Term Goals - 10/04/17 2112      PT LONG TERM GOAL #1   Title  BERG score to improve by appropriate MDC    Baseline  to be tested at next visit, MDC depending on score    Time  8    Period  Weeks    Status  New    Target Date  12/01/17      PT LONG TERM GOAL #2   Title  Pt will demo safety on steps/stairs with minimal UE support    Baseline  multiple falls on steps to enter home    Time  8    Period  Weeks    Status  New    Target Date  12/01/17      PT LONG TERM GOAL #3   Title  Pt will be able to drive without limitation by cervical pain    Baseline  pain when looking behind or over her shoulder    Time  8    Period  Weeks    Status  New    Target Date  12/01/17      PT LONG TERM GOAL #4   Title  Pt will be able to bend to lift objects such as laundry basket without limitation by back/neck pain    Baseline  pain at eval    Time  8    Period  Weeks    Status  New    Target Date  12/01/17      PT LONG TERM GOAL #5   Title  Pt will be able to complete household chores with pain <=2/10 and verbalize proper safety precautions to avoid falls    Baseline  unable to complete desired household chores at eval    Time  8    Period  Weeks  Status  New    Target Date  12/01/17            Plan - 10/16/17 1748    Clinical Impression Statement  Patient able to work on strengthening, balance and gait.  The only pain noted by patient was with dynamic balance exercises with trunk rotation to right/left with hands together and elbows straight.  No pai after exercise stopped.  Gait improved with trunk rotation after cues.     PT Next Visit Plan  airex/wobble board balance, cont periscap strengthening    PT Home Exercise Plan  scapular retraction, LTR, bridge, clam, chin tucks, supine horiz abd, supine GHJ flx stretch,  YOGA walking    Consulted and Agree with Plan of Care  Patient    Family Member Consulted  husband        Patient will benefit from skilled therapeutic intervention in order to improve the following deficits and impairments:     Visit Diagnosis: Chronic bilateral low back pain without sciatica  Cervicalgia  Repeated falls     Problem List Patient Active Problem List   Diagnosis Date Noted  . Gait abnormality 08/30/2017  . Lumbar radiculopathy 08/30/2017  . Chronic left-sided low back pain with left-sided sciatica 07/21/2017  . Personal history of calcium pyrophosphate deposition disease (CPPD) 07/29/2016  . History of peripheral neuropathy 07/29/2016  . History of total knee replacement, right 07/22/2016  . Pseudogout 01/22/2016  . Primary osteoarthritis of both knees 01/22/2016  . Primary osteoarthritis of both hands 01/22/2016  . Primary osteoarthritis of both feet 01/22/2016  . Fibromyalgia 01/22/2016  . Primary insomnia 01/22/2016  . Obesity (BMI 30-39.9) 04/01/2014  . Obstructive sleep apnea 04/19/2013  . Essential hypertension, benign 04/19/2013  . Memory loss 08/02/2012    Grant Henkes PTA 10/16/2017, 5:52 PM  [facility] [address] [place], [place], [address] Phone: [phone]   Fax:  [phone]  Name: Lynn Price MRN: [id number] Date of Birth: 1940/04/16

## 2017-10-18 ENCOUNTER — Encounter: Payer: Self-pay | Admitting: Physical Therapy

## 2017-10-18 ENCOUNTER — Ambulatory Visit: Payer: Medicare Other | Admitting: Physical Therapy

## 2017-10-18 DIAGNOSIS — M545 Low back pain, unspecified: Secondary | ICD-10-CM

## 2017-10-18 DIAGNOSIS — G8929 Other chronic pain: Secondary | ICD-10-CM

## 2017-10-18 DIAGNOSIS — M542 Cervicalgia: Secondary | ICD-10-CM

## 2017-10-18 DIAGNOSIS — R296 Repeated falls: Secondary | ICD-10-CM

## 2017-10-18 NOTE — Therapy (Signed)
[facility] [address] [place], [place], [address] Phone: [phone]   Fax:  [phone]  Physical Therapy Treatment  Patient Details  Name: Lynn Price MRN: [id number] Date of Birth: November 01, 1940 Referring Provider: Valeria BatmanWhitfield, Peter W, MD   Encounter Date: 10/18/2017  PT End of Session - 10/18/17 1449    Visit Number  5    Number of Visits  17    Date for PT Re-Evaluation  12/01/17    Authorization Type  U[employer] MCR    PN at visit 10, KX at visit 15    PT Start Time  1450    PT Stop Time  1529    PT Time Calculation (min)  39 min    Activity Tolerance  Patient tolerated treatment well    Behavior During Therapy  [facility] for tasks assessed/performed       Past Medical History:  Diagnosis Date  . Anxiety   . Fibromyalgia   . GERD (gastroesophageal reflux disease)   . High blood pressure   . HSV (herpes simplex virus) anogenital infection   . IBS (irritable bowel syndrome)   . Memory loss   . Neuropathy   . Obesity (BMI 30-39.9) 04/01/2014  . Osteoarthritis   . Overactive bladder   . Restless leg syndrome   . Sleep apnea     Past Surgical History:  Procedure Laterality Date  . APPENDECTOMY    . CATARACT EXTRACTION Left   . CESAREAN SECTION    . CHOLECYSTECTOMY    . colonscopy    . MULTIPLE TOOTH EXTRACTIONS    . ROTATOR CUFF REPAIR Left   . TOTAL KNEE ARTHROPLASTY      There were no vitals filed for this visit.  Subjective Assessment - 10/18/17 1450    Subjective  Feeling okay in general. My hips and Lt leg are sore. "not stabbing, just regular pain"    Patient Stated Goals  2 steps at home with one railing, turn head side/side, bending, housework                       [facility]/Exercise - 10/18/17 0001      Exercises   Exercises  Knee/Hip;Shoulder      Knee/Hip Exercises: Stretches   Passive Hamstring Stretch  Both;30 seconds   eob   Other Knee/Hip Stretches  LTR       Knee/Hip Exercises: Aerobic   Nustep  L5 UE & LE 5 min      Knee/Hip Exercises: Standing   Other Standing Knee Exercises  side stepping       Knee/Hip Exercises: Seated   Sit to Sand  10 reps      Knee/Hip Exercises: Supine   Other Supine Knee/Hip Exercises  march to tabletop holds      Shoulder Exercises: Supine   Horizontal ABduction  15 reps;Theraband    Theraband Level (Shoulder Horizontal ABduction)  Level 2 (Red)    External Rotation  20 reps;Theraband    Theraband Level (Shoulder External Rotation)  Level 2 (Red)    Other Supine Exercises  chin tuck      Shoulder Exercises: Stretch   Other Shoulder Stretches  supine pec stretch               PT Short Term Goals - 10/04/17 2110      PT SHORT TERM GOAL #1   Title  Pt and husband will report compliance with HEP  Baseline  began establishing at eval    Time  4    Period  Weeks    Status  New    Target Date  11/03/17      PT SHORT TERM GOAL #2   Title  Pt will demo proper use of SPC and verbalize comfort and safety during daily activities    Baseline  will educate and challenge as appropriate    Time  4    Period  Weeks    Status  New    Target Date  11/03/17      PT SHORT TERM GOAL #3   Title  pt will demo proper upright posture with minimal cuing    Baseline  verbal and tactile cues required at eval    Time  4    Period  Weeks    Status  New    Target Date  11/03/17        PT Long Term Goals - 10/04/17 2112      PT LONG TERM GOAL #1   Title  BERG score to improve by appropriate MDC    Baseline  to be tested at next visit, MDC depending on score    Time  8    Period  Weeks    Status  New    Target Date  12/01/17      PT LONG TERM GOAL #2   Title  Pt will demo safety on steps/stairs with minimal UE support    Baseline  multiple falls on steps to enter home    Time  8    Period  Weeks    Status  New    Target Date  12/01/17      PT LONG TERM GOAL #3   Title  Pt will be able to drive  without limitation by cervical pain    Baseline  pain when looking behind or over her shoulder    Time  8    Period  Weeks    Status  New    Target Date  12/01/17      PT LONG TERM GOAL #4   Title  Pt will be able to bend to lift objects such as laundry basket without limitation by back/neck pain    Baseline  pain at eval    Time  8    Period  Weeks    Status  New    Target Date  12/01/17      PT LONG TERM GOAL #5   Title  Pt will be able to complete household chores with pain <=2/10 and verbalize proper safety precautions to avoid falls    Baseline  unable to complete desired household chores at eval    Time  8    Period  Weeks    Status  New    Target Date  12/01/17            Plan - 10/18/17 1529    Clinical Impression Statement  Pt able to correct resting posture independently. cont narrow step width resulting in shoes rubbing together and is a tripping hazzard. Decreased neck pain reported.     PT Treatment/Interventions  ADLs/Self Care Home Management;Cryotherapy;Electrical Stimulation;Moist Heat;Traction;Therapeutic exercise;Therapeutic activities;Functional mobility training;Stair training;Gait training;Balance training;Neuromuscular re-education;Patient/family education;Manual techniques;Taping;Dry needling;Passive range of motion    PT Next Visit Plan  airex/wobble board in mirror    PT Home Exercise Plan  scapular retraction, LTR, bridge, clam, chin tucks, supine horiz abd, supine GHJ flx stretch,  YOGA walking, sit<>stand without UE    Consulted and Agree with Plan of Care  Patient    Family Member Consulted  husband       Patient will benefit from skilled therapeutic intervention in order to improve the following deficits and impairments:  Abnormal gait, Decreased endurance, Decreased activity tolerance, Decreased strength, Pain, Increased muscle spasms, Decreased balance, Decreased range of motion, Impaired flexibility, Postural dysfunction  Visit  Diagnosis: Chronic bilateral low back pain without sciatica  Cervicalgia  Repeated falls     Problem List Patient Active Problem List   Diagnosis Date Noted  . Gait abnormality 08/30/2017  . Lumbar radiculopathy 08/30/2017  . Chronic left-sided low back pain with left-sided sciatica 07/21/2017  . Personal history of calcium pyrophosphate deposition disease (CPPD) 07/29/2016  . History of peripheral neuropathy 07/29/2016  . History of total knee replacement, right 07/22/2016  . Pseudogout 01/22/2016  . Primary osteoarthritis of both knees 01/22/2016  . Primary osteoarthritis of both hands 01/22/2016  . Primary osteoarthritis of both feet 01/22/2016  . Fibromyalgia 01/22/2016  . Primary insomnia 01/22/2016  . Obesity (BMI 30-39.9) 04/01/2014  . Obstructive sleep apnea 04/19/2013  . Essential hypertension, benign 04/19/2013  . Memory loss 08/02/2012   Lynn Price PT, DPT 10/18/17 3:31 PM   [facility] [address] [place], [place], [address] Phone: [phone]   Fax:  [phone]  Name: Lynn Price MRN: [id number] Date of Birth: 1940/09/05

## 2017-10-23 ENCOUNTER — Encounter: Payer: Self-pay | Admitting: Physical Therapy

## 2017-10-23 ENCOUNTER — Ambulatory Visit: Payer: Medicare Other | Admitting: Physical Therapy

## 2017-10-23 DIAGNOSIS — M542 Cervicalgia: Secondary | ICD-10-CM

## 2017-10-23 DIAGNOSIS — R296 Repeated falls: Secondary | ICD-10-CM

## 2017-10-23 DIAGNOSIS — G8929 Other chronic pain: Secondary | ICD-10-CM

## 2017-10-23 DIAGNOSIS — M545 Low back pain, unspecified: Secondary | ICD-10-CM

## 2017-10-23 NOTE — Therapy (Signed)
[facility] [address] [place], [place], [address] Phone: [phone]   Fax:  [phone]  Physical Therapy Treatment  Patient Details  Name: Lynn Price MRN: [id number] Date of Birth: [birth year] Referring Provider: Valeria Batman, MD   Encounter Date: 10/23/2017  PT End of Session - 10/23/17 1829    Visit Number  6    Number of Visits  17    Date for PT Re-Evaluation  12/01/17    PT Start Time  1335    PT Stop Time  1425    PT Time Calculation (min)  50 min    Activity Tolerance  Patient tolerated treatment well;No increased pain    Behavior During Therapy  WFL for tasks assessed/performed       Past Medical History:  Diagnosis Date  . Anxiety   . Fibromyalgia   . GERD (gastroesophageal reflux disease)   . High blood pressure   . HSV (herpes simplex virus) anogenital infection   . IBS (irritable bowel syndrome)   . Memory loss   . Neuropathy   . Obesity (BMI 30-39.9) 04/01/2014  . Osteoarthritis   . Overactive bladder   . Restless leg syndrome   . Sleep apnea     Past Surgical History:  Procedure Laterality Date  . APPENDECTOMY    . CATARACT EXTRACTION Left   . CESAREAN SECTION    . CHOLECYSTECTOMY    . colonscopy    . MULTIPLE TOOTH EXTRACTIONS    . ROTATOR CUFF REPAIR Left   . TOTAL KNEE ARTHROPLASTY      There were no vitals filed for this visit.  Subjective Assessment - 10/23/17 1338    Subjective  I have been better with the balance with walking.  The cane helps.  I have been doing the exercises    Patient is accompained by:  Family member    Currently in Pain?  No/denies    Pain Location  Back                       [facility] Adult PT Treatment/Exercise - 10/23/17 0001      High Level Balance   High Level Balance Activities  --   airex/  wobble board  mirrir used,  CGA    High Level Balance Comments  narrow/ wide, static dtnamic balance        Lumbar Exercises:  Stretches   Passive Hamstring Stretch  3 reps;30 seconds    Passive Hamstring Stretch Limitations  sitting difficult ,  better supine with sheet,  HEP    Lower Trunk Rotation  5 reps      Lumbar Exercises: Aerobic   Nustep  5 minutes UE/LE  level 5      Lumbar Exercises: Supine   Clam  10 reps    Clam Limitations  red band cue     Bent Knee Raise  5 reps    Bridge  10 reps    Bridge with [employer]  10 reps      Knee/Hip Exercises: Stretches   Passive Hamstring Stretch  3 reps;30 seconds   sitting,  both     Knee/Hip Exercises: Standing   Other Standing Knee Exercises  resisted walking forward  # X 12 feet,  side steps 6 feet  each  X3  ( manually)      Knee/Hip Exercises: Seated   Sit to Sand  10 reps  Knee/Hip Exercises: Supine   Other Supine Knee/Hip Exercises    resisted walking forward, and to each side  PTA for resistance   sitting with     Shoulder Exercises: Supine   Horizontal ABduction  10 reps    Theraband Level (Shoulder Horizontal ABduction)  Level 2 (Red)    External Rotation  10 reps    Theraband Level (Shoulder External Rotation)  Level 2 (Red)    Other Supine Exercises  5 X chin tuck  with cues      Shoulder Exercises: Stretch   Other Shoulder Stretches  door way stretch bpoth 2 xz 20 seconds,  moderate cues             PT Education - 10/23/17 1826    Education Details  HEP    Person(s) Educated  Patient    Methods  Explanation;Demonstration;Tactile cues;Verbal cues;Handout    Comprehension  Verbalized understanding;Returned demonstration       PT Short Term Goals - 10/04/17 2110      PT SHORT TERM GOAL #1   Title  Pt and husband will report compliance with HEP    Baseline  began establishing at eval    Time  4    Period  Weeks    Status  New    Target Date  11/03/17      PT SHORT TERM GOAL #2   Title  Pt will demo proper use of SPC and verbalize comfort and safety during daily activities    Baseline  will educate and challenge  as appropriate    Time  4    Period  Weeks    Status  New    Target Date  11/03/17      PT SHORT TERM GOAL #3   Title  pt will demo proper upright posture with minimal cuing    Baseline  verbal and tactile cues required at eval    Time  4    Period  Weeks    Status  New    Target Date  11/03/17        PT Long Term Goals - 10/04/17 2112      PT LONG TERM GOAL #1   Title  BERG score to improve by appropriate MDC    Baseline  to be tested at next visit, MDC depending on score    Time  8    Period  Weeks    Status  New    Target Date  12/01/17      PT LONG TERM GOAL #2   Title  Pt will demo safety on steps/stairs with minimal UE support    Baseline  multiple falls on steps to enter home    Time  8    Period  Weeks    Status  New    Target Date  12/01/17      PT LONG TERM GOAL #3   Title  Pt will be able to drive without limitation by cervical pain    Baseline  pain when looking behind or over her shoulder    Time  8    Period  Weeks    Status  New    Target Date  12/01/17      PT LONG TERM GOAL #4   Title  Pt will be able to bend to lift objects such as laundry basket without limitation by back/neck pain    Baseline  pain at eval    Time  8  Period  Weeks    Status  New    Target Date  12/01/17      PT LONG TERM GOAL #5   Title  Pt will be able to complete household chores with pain <=2/10 and verbalize proper safety precautions to avoid falls    Baseline  unable to complete desired household chores at eval    Time  8    Period  Weeks    Status  New    Target Date  12/01/17            Plan - 10/23/17 1829    Clinical Impression Statement  Balance and strengthening continued. CGA needed for safety.  No pain today.  moderate cues at times for technique.  Patrient was able to use cane in community and home for safety.     PT Next Visit Plan  airex/wobble board in mirror  continue     PT Home Exercise Plan  scapular retraction, LTR, bridge, clam, chin  tucks, supine horiz abd, supine GHJ flx stretch,  YOGA walking, sit<>stand without UE    Consulted and Agree with Plan of Care  Patient    Family Member Consulted  husband       Patient will benefit from skilled therapeutic intervention in order to improve the following deficits and impairments:     Visit Diagnosis: Chronic bilateral low back pain without sciatica  Cervicalgia  Repeated falls     Problem List Patient Active Problem List   Diagnosis Date Noted  . Gait abnormality 08/30/2017  . Lumbar radiculopathy 08/30/2017  . Chronic left-sided low back pain with left-sided sciatica 07/21/2017  . Personal history of calcium pyrophosphate deposition disease (CPPD) 07/29/2016  . History of peripheral neuropathy 07/29/2016  . History of total knee replacement, right 07/22/2016  . Pseudogout 01/22/2016  . Primary osteoarthritis of both knees 01/22/2016  . Primary osteoarthritis of both hands 01/22/2016  . Primary osteoarthritis of both feet 01/22/2016  . Fibromyalgia 01/22/2016  . Primary insomnia 01/22/2016  . Obesity (BMI 30-39.9) 04/01/2014  . Obstructive sleep apnea 04/19/2013  . Essential hypertension, benign 04/19/2013  . Memory loss 08/02/2012    Lummie Montijo PTA 10/23/2017, 6:33 PM  [facility] [address] [place], [place], [address] Phone: [phone]   Fax:  [phone]  Name: Lynn Price MRN: [id number] Date of Birth: Feb 20, 1941

## 2017-10-23 NOTE — Patient Instructions (Signed)
Hamstring: Towel Stretch (Supine)    Lie on back. Loop towel around left foot, hip and knee at 90. Straighten knee and pull foot toward body. Hold _30__ seconds. Relax. Repeat _3__ times. Do _1__ times a day. Repeat with other leg.1    Copyright  VHI. All rights reserved.

## 2017-10-25 ENCOUNTER — Encounter: Payer: Self-pay | Admitting: Physical Therapy

## 2017-10-25 ENCOUNTER — Ambulatory Visit: Payer: Medicare Other | Admitting: Physical Therapy

## 2017-10-25 ENCOUNTER — Telehealth: Payer: Self-pay | Admitting: Physical Therapy

## 2017-10-25 DIAGNOSIS — M542 Cervicalgia: Secondary | ICD-10-CM

## 2017-10-25 DIAGNOSIS — G8929 Other chronic pain: Secondary | ICD-10-CM

## 2017-10-25 DIAGNOSIS — M545 Low back pain, unspecified: Secondary | ICD-10-CM

## 2017-10-25 DIAGNOSIS — R296 Repeated falls: Secondary | ICD-10-CM

## 2017-10-25 NOTE — Telephone Encounter (Signed)
Left message on answering machine about missed visit today.  Date and time of next visit given.  Phone number given for her to call if she is unable to make the next appointment or if she is no longer interested in PT.  I asked her to refer to the policy foe missed visits.  Liz BeachKaren Cherrell Maybee PTA

## 2017-10-25 NOTE — Therapy (Signed)
[facility] [address] [place], [place], [address] Phone: [phone]   Fax:  [phone]  Physical Therapy Treatment  Patient Details  Name: Lynn Price MRN: [id number] Date of Birth: 10-May-1940 Referring Provider: Valeria Batman, MD   Encounter Date: 10/25/2017  PT End of Session - 10/25/17 1623    Visit Number  7    Number of Visits  17    Date for PT Re-Evaluation  12/01/17    PT Start Time  1512    PT Stop Time  1610    PT Time Calculation (min)  58 min    Activity Tolerance  Patient tolerated treatment well    Behavior During Therapy  [facility] for tasks assessed/performed       Past Medical History:  Diagnosis Date  . Anxiety   . Fibromyalgia   . GERD (gastroesophageal reflux disease)   . High blood pressure   . HSV (herpes simplex virus) anogenital infection   . IBS (irritable bowel syndrome)   . Memory loss   . Neuropathy   . Obesity (BMI 30-39.9) 04/01/2014  . Osteoarthritis   . Overactive bladder   . Restless leg syndrome   . Sleep apnea     Past Surgical History:  Procedure Laterality Date  . APPENDECTOMY    . CATARACT EXTRACTION Left   . CESAREAN SECTION    . CHOLECYSTECTOMY    . colonscopy    . MULTIPLE TOOTH EXTRACTIONS    . ROTATOR CUFF REPAIR Left   . TOTAL KNEE ARTHROPLASTY      There were no vitals filed for this visit.  Subjective Assessment - 10/25/17 1526    Subjective  back hurts in am.  After she moves around she is able to get going.      Patient is accompained by:  Family member    Currently in Pain?  No/denies    Pain Location  Back    Pain Orientation  Lower    Pain Descriptors / Indicators  Sore    Aggravating Factors   1st thing in the morning.  Lifting laundry    Pain Relieving Factors  resting                       [facility] Adult PT Treatment/Exercise - 10/25/17 0001      High Level Balance   High Level Balance Activities  --   airex/  wobble board   CGA    High Level Balance Comments  narrow/ wide, static dtnamic balance   single leg on airex   close SBA to min assist with more challanging,  improving      Therapeutic Activites    Therapeutic Activities  Lifting    Lifting  practice laundry basket with 1 sheet,  Discussed stratigies for lifting at home.        Lumbar Exercises: Supine   Other Supine Lumbar Exercises  decompression 3 reps each head , leg, shoulder press,  and leg lengthener,  she needs 2 pillows under head and under leg      Knee/Hip Exercises: Stretches   Passive Hamstring Stretch  3 reps;30 seconds   sheet used            PT Education - 10/25/17 1620    Education Details  HEP  all reprinted at husband's request.    Person(s) Educated  Patient;Spouse    Methods  Explanation;Handout    Comprehension  Verbalized understanding  PT Short Term Goals - 10/25/17 1628      PT SHORT TERM GOAL #1   Title  Pt and husband will report compliance with HEP    Baseline  not compliant with all,  cannot fine all that were issued,  reprinted today.     Time  4    Period  Weeks    Status  On-going      PT SHORT TERM GOAL #2   Title  Pt will demo proper use of SPC and verbalize comfort and safety during daily activities    Baseline  Feels comfortable,  leaves cane behind at home and has to go find.      Time  4    Period  Weeks    Status  On-going      PT SHORT TERM GOAL #3   Title  pt will demo proper upright posture with minimal cuing    Baseline  needs cues    Time  4    Period  Weeks    Status  On-going        PT Long Term Goals - 10/04/17 2112      PT LONG TERM GOAL #1   Title  BERG score to improve by appropriate MDC    Baseline  to be tested at next visit, MDC depending on score    Time  8    Period  Weeks    Status  New    Target Date  12/01/17      PT LONG TERM GOAL #2   Title  Pt will demo safety on steps/stairs with minimal UE support    Baseline  multiple falls on steps to enter  home    Time  8    Period  Weeks    Status  New    Target Date  12/01/17      PT LONG TERM GOAL #3   Title  Pt will be able to drive without limitation by cervical pain    Baseline  pain when looking behind or over her shoulder    Time  8    Period  Weeks    Status  New    Target Date  12/01/17      PT LONG TERM GOAL #4   Title  Pt will be able to bend to lift objects such as laundry basket without limitation by back/neck pain    Baseline  pain at eval    Time  8    Period  Weeks    Status  New    Target Date  12/01/17      PT LONG TERM GOAL #5   Title  Pt will be able to complete household chores with pain <=2/10 and verbalize proper safety precautions to avoid falls    Baseline  unable to complete desired household chores at eval    Time  8    Period  Weeks    Status  New    Target Date  12/01/17            Plan - 10/25/17 1624    Clinical Impression Statement  Patient/ husband cannot remember the last time she fell on stairs,  She was able to pick up a basket without pain ( 1 sheet in basket, light) meoderate cues needed.  Posture and exercises for home not yet happening regularly.  (They needed reprint of current HEP to date)Mild pain increases during exercises,  Not reported to PTA.  PT Next Visit Plan  airex/wobble board in mirror  continue lifting practice,  consider decompression for HEP    PT Home Exercise Plan  scapular retraction, LTR, bridge, clam, chin tucks, supine horiz abd, supine GHJ flx stretch,  YOGA walking, sit<>stand without UE,  hamstring stretch    Family Member Consulted  husband       Patient will benefit from skilled therapeutic intervention in order to improve the following deficits and impairments:     Visit Diagnosis: Chronic bilateral low back pain without sciatica  Cervicalgia  Repeated falls     Problem List Patient Active Problem List   Diagnosis Date Noted  . Gait abnormality 08/30/2017  . Lumbar radiculopathy  08/30/2017  . Chronic left-sided low back pain with left-sided sciatica 07/21/2017  . Personal history of calcium pyrophosphate deposition disease (CPPD) 07/29/2016  . History of peripheral neuropathy 07/29/2016  . History of total knee replacement, right 07/22/2016  . Pseudogout 01/22/2016  . Primary osteoarthritis of both knees 01/22/2016  . Primary osteoarthritis of both hands 01/22/2016  . Primary osteoarthritis of both feet 01/22/2016  . Fibromyalgia 01/22/2016  . Primary insomnia 01/22/2016  . Obesity (BMI 30-39.9) 04/01/2014  . Obstructive sleep apnea 04/19/2013  . Essential hypertension, benign 04/19/2013  . Memory loss 08/02/2012    Devyne Hauger PTA 10/25/2017, 5:38 PM  [facility] [address] [place], [place], [address] Phone: [phone]   Fax:  [phone]  Name: Lowanda Fosterlaine C Frei MRN: [id number] Date of Birth: [birth year]

## 2017-10-31 ENCOUNTER — Encounter: Payer: Medicare Other | Admitting: Physical Therapy

## 2017-11-02 ENCOUNTER — Encounter: Payer: Medicare Other | Admitting: Physical Therapy

## 2017-11-06 ENCOUNTER — Encounter: Payer: Self-pay | Admitting: Physical Therapy

## 2017-11-06 ENCOUNTER — Ambulatory Visit: Payer: Medicare Other | Attending: Orthopaedic Surgery | Admitting: Physical Therapy

## 2017-11-06 DIAGNOSIS — R296 Repeated falls: Secondary | ICD-10-CM | POA: Diagnosis present

## 2017-11-06 DIAGNOSIS — M542 Cervicalgia: Secondary | ICD-10-CM | POA: Insufficient documentation

## 2017-11-06 DIAGNOSIS — M545 Low back pain, unspecified: Secondary | ICD-10-CM

## 2017-11-06 DIAGNOSIS — G8929 Other chronic pain: Secondary | ICD-10-CM | POA: Diagnosis present

## 2017-11-06 NOTE — Therapy (Signed)
[place], [place], [address] Phone: [phone]   Fax:  [phone]  Physical Therapy Treatment  Patient Details  Name: Lynn Price MRN: [id number] Date of Birth: 1940/11/22 Referring Provider: Garald Balding, MD   Encounter Date: 11/06/2017  PT End of Session - 11/06/17 1513    Visit Number  8    Number of Visits  17    Date for PT Re-Evaluation  12/01/17    Authorization Type  [employer] MCR    PN at visit 10, KX at visit 15    PT Start Time  1415    PT Stop Time  1505    PT Time Calculation (min)  50 min    Activity Tolerance  Patient tolerated treatment well    Behavior During Therapy  [facility] for tasks assessed/performed       Past Medical History:  Diagnosis Date  . Anxiety   . Fibromyalgia   . GERD (gastroesophageal reflux disease)   . High blood pressure   . HSV (herpes simplex virus) anogenital infection   . IBS (irritable bowel syndrome)   . Memory loss   . Neuropathy   . Obesity (BMI 30-39.9) 04/01/2014  . Osteoarthritis   . Overactive bladder   . Restless leg syndrome   . Sleep apnea     Past Surgical History:  Procedure Laterality Date  . APPENDECTOMY    . CATARACT EXTRACTION Left   . CESAREAN SECTION    . CHOLECYSTECTOMY    . colonscopy    . MULTIPLE TOOTH EXTRACTIONS    . ROTATOR CUFF REPAIR Left   . TOTAL KNEE ARTHROPLASTY      There were no vitals filed for this visit.  Subjective Assessment - 11/06/17 1419    Subjective  I was able to walk  lesss than 30 minutes and i felt fine.  PT is helpinf a lot. i am moving more and have more energy.   balance continues to improve.   Carrying groceries inside is a challange.    laundry is still achallange.  Able to pop out of bed in the morning a lot easier.      Patient is accompained by:  Family member    Currently in Pain?  No/denies    Pain Location  Back    Pain Orientation  Lower    Pain Descriptors / Indicators  Sore    tired   Pain Radiating Towards  tingling continues  not sure the cause.  brief    Aggravating Factors   lifting laundry.      Pain Relieving Factors  resting,  exercise                       [facility] Adult PT Treatment/Exercise - 11/06/17 0001      Lumbar Exercises: Stretches   Passive Hamstring Stretch  3 reps;30 seconds   sheet   Lower Trunk Rotation  5 reps      Lumbar Exercises: Supine   Clam  10 reps    Clam Limitations  red band    Bridge  10 reps   cued technique to decrease pain   Other Supine Lumbar Exercises  decompression 3 reps each head , leg, shoulder press,  and leg lengthener,  she needs 2 pillows under head and under leg      Knee/Hip Exercises: Seated   Sit to Sand  10 reps   no hands  Shoulder Exercises: Supine   Horizontal ABduction  10 reps    Theraband Level (Shoulder Horizontal ABduction)  Level 2 (Red)      Shoulder Exercises: Seated   External Rotation  10 reps    Theraband Level (Shoulder External Rotation)  Level 2 (Red)             PT Education - 11/06/17 1513    Education Details  importance of HEP    Person(s) Educated  Patient;Spouse    Methods  Explanation    Comprehension  Verbalized understanding       PT Short Term Goals - 11/06/17 1524      PT SHORT TERM GOAL #1   Title  Pt and husband will report compliance with HEP    Baseline  not compliant    Time  4    Period  Weeks    Status  On-going      PT SHORT TERM GOAL #2   Title  Pt will demo proper use of SPC and verbalize comfort and safety during daily activities    Baseline  uses cane with increased comfort,  looses it often at home.      Time  4    Period  Weeks    Status  On-going      PT SHORT TERM GOAL #3   Title  pt will demo proper upright posture with minimal cuing    Baseline  needs cues    Time  4    Period  Weeks        PT Long Term Goals - 10/04/17 2112      PT LONG TERM GOAL #1   Title  BERG score to improve by appropriate MDC     Baseline  to be tested at next visit, [place] depending on score    Time  8    Period  Weeks    Status  New    Target Date  12/01/17      PT LONG TERM GOAL #2   Title  Pt will demo safety on steps/stairs with minimal UE support    Baseline  multiple falls on steps to enter home    Time  8    Period  Weeks    Status  New    Target Date  12/01/17      PT LONG TERM GOAL #3   Title  Pt will be able to drive without limitation by cervical pain    Baseline  pain when looking behind or over her shoulder    Time  8    Period  Weeks    Status  New    Target Date  12/01/17      PT LONG TERM GOAL #4   Title  Pt will be able to bend to lift objects such as laundry basket without limitation by back/neck pain    Baseline  pain at eval    Time  8    Period  Weeks    Status  New    Target Date  12/01/17      PT LONG TERM GOAL #5   Title  Pt will be able to complete household chores with pain <=2/10 and verbalize proper safety precautions to avoid falls    Baseline  unable to complete desired household chores at eval    Time  8    Period  Weeks    Status  New    Target Date  12/01/17  Plan - 11/06/17 1515    Clinical Impression Statement  Session focuses on HEP due to it not getting done at home.  Patient is more functional and has more energy despite non compliance.   No new goals met.   She uses the cane for most walking indoors and uses cane for most all walking outside.     PT Next Visit Plan  Consider exercise log for  HEP.   continue lifting practice,   balance challanges    PT Home Exercise Plan  scapular retraction, LTR, bridge, clam, chin tucks, supine horiz abd, supine GHJ flx stretch,  YOGA walking, sit<>stand without UE,  hamstring stretch    Consulted and Agree with Plan of Care  Patient    Family Member Consulted  husband       Patient will benefit from skilled therapeutic intervention in order to improve the following deficits and impairments:     Visit  Diagnosis: Chronic bilateral low back pain without sciatica  Cervicalgia  Repeated falls     Problem List Patient Active Problem List   Diagnosis Date Noted  . Gait abnormality 08/30/2017  . Lumbar radiculopathy 08/30/2017  . Chronic left-sided low back pain with left-sided sciatica 07/21/2017  . Personal history of calcium pyrophosphate deposition disease (CPPD) 07/29/2016  . History of peripheral neuropathy 07/29/2016  . History of total knee replacement, right 07/22/2016  . Pseudogout 01/22/2016  . Primary osteoarthritis of both knees 01/22/2016  . Primary osteoarthritis of both hands 01/22/2016  . Primary osteoarthritis of both feet 01/22/2016  . Fibromyalgia 01/22/2016  . Primary insomnia 01/22/2016  . Obesity (BMI 30-39.9) 04/01/2014  . Obstructive sleep apnea 04/19/2013  . Essential hypertension, benign 04/19/2013  . Memory loss 08/02/2012    Gearldine Looney PTA 11/06/2017, 3:26 PM  [facility] [address] [place], [place], [address] Phone: [phone]   Fax:  [phone]  Name: Lynn Price MRN: [id number] Date of Birth: 17-Oct-1940

## 2017-11-06 NOTE — Patient Instructions (Signed)
Really try to do the exercises at home.

## 2017-11-08 ENCOUNTER — Encounter: Payer: Self-pay | Admitting: Physical Therapy

## 2017-11-08 ENCOUNTER — Ambulatory Visit: Payer: Medicare Other | Admitting: Physical Therapy

## 2017-11-08 DIAGNOSIS — G8929 Other chronic pain: Secondary | ICD-10-CM

## 2017-11-08 DIAGNOSIS — M545 Low back pain, unspecified: Secondary | ICD-10-CM

## 2017-11-08 DIAGNOSIS — R296 Repeated falls: Secondary | ICD-10-CM

## 2017-11-08 DIAGNOSIS — M542 Cervicalgia: Secondary | ICD-10-CM

## 2017-11-08 NOTE — Therapy (Signed)
[place], [place], [address] Phone: [phone]   Fax:  [phone]  Physical Therapy Treatment  Progress Note Reporting Period 10/04/2017 to 11/08/2017   See note below for Objective Data and Assessment of Progress/Goals.       Patient Details  Name: Lynn Price MRN: [id number] Date of Birth: October 18, 1940 Referring Provider: Garald Balding, MD   Encounter Date: 11/08/2017  PT End of Session - 11/08/17 1416    Visit Number  9    Number of Visits  17    Date for PT Re-Evaluation  12/01/17    Authorization Type  UHC MCR    PN at visit 10, KX at visit 15    PT Start Time  1415    PT Stop Time  1458    PT Time Calculation (min)  43 min    Activity Tolerance  Patient tolerated treatment well    Behavior During Therapy  [facility] for tasks assessed/performed       Past Medical History:  Diagnosis Date  . Anxiety   . Fibromyalgia   . GERD (gastroesophageal reflux disease)   . High blood pressure   . HSV (herpes simplex virus) anogenital infection   . IBS (irritable bowel syndrome)   . Memory loss   . Neuropathy   . Obesity (BMI 30-39.9) 04/01/2014  . Osteoarthritis   . Overactive bladder   . Restless leg syndrome   . Sleep apnea     Past Surgical History:  Procedure Laterality Date  . APPENDECTOMY    . CATARACT EXTRACTION Left   . CESAREAN SECTION    . CHOLECYSTECTOMY    . colonscopy    . MULTIPLE TOOTH EXTRACTIONS    . ROTATOR CUFF REPAIR Left   . TOTAL KNEE ARTHROPLASTY      There were no vitals filed for this visit.  Subjective Assessment - 11/08/17 1418    Subjective  feeling good overall. mostly just in the morning when my back hurts. I feel like I lose my balance- I lose control below my knees    Patient Stated Goals  2 steps at home with one railing, turn head side/side, bending, housework    Currently in Pain?  No/denies         [facility] PT Assessment - 11/08/17 0001       Sensation   Additional Comments  occasional tingling & burning in feet, HA resolved      AROM   Cervical Flexion  55    Cervical - Right Side Bend  28    Cervical - Left Side Bend  28    Cervical - Right Rotation  60    Cervical - Left Rotation  64      Strength   Overall Strength Comments  gross 5/5    Right Hip Flexion  5/5    Right Hip ABduction  4+/5    Left Hip Flexion  5/5    Left Hip ABduction  4+/5      High Level Balance   High Level Balance Comments  narrow/ wide, static dtnamic balance   single leg on airex      Berg Balance Test   Sit to Stand  Able to stand without using hands and stabilize independently    Standing Unsupported  Able to stand safely 2 minutes    Sitting with Back Unsupported but Feet Supported on Floor or Stool  Able to sit safely and securely  2 minutes    Stand to Sit  Sits safely with minimal use of hands    Transfers  Able to transfer safely, minor use of hands    Standing Unsupported with Eyes Closed  Able to stand 10 seconds safely    Standing Ubsupported with Feet Together  Able to place feet together independently and stand 1 minute safely    From Standing, Reach Forward with Outstretched Arm  Can reach confidently >25 cm (10")    From Standing Position, Pick up Object from Floor  Able to pick up shoe safely and easily    From Standing Position, Turn to Look Behind Over each Shoulder  Looks behind from both sides and weight shifts well    Turn 360 Degrees  Able to turn 360 degrees safely in 4 seconds or less    Standing Unsupported, Alternately Place Feet on Step/Stool  Able to stand independently and safely and complete 8 steps in 20 seconds    Standing Unsupported, One Foot in Front  Able to plae foot ahead of the other independently and hold 30 seconds    Standing on One Leg  Able to lift leg independently and hold 5-10 seconds    Total Score  54      High Level Balance   High Level Balance Comments  narrow step width cont; good balance  control in gait speed changes and with head turns. LOB corrected independently when turning around                   [facility] Adult PT Treatment/Exercise - 11/08/17 0001      Lumbar Exercises: Stretches   Passive Hamstring Stretch  Right;Left;3 reps;20 seconds    Lower Trunk Rotation  5 reps      Lumbar Exercises: Supine   Bent Knee Raise  20 reps   cues for core            PT Education - 11/08/17 1549    Education Details  goals, progress, POC, importance of HEP    Person(s) Educated  Patient;Spouse    Methods  Explanation;Demonstration;Verbal cues;Tactile cues    Comprehension  Verbalized understanding;Returned demonstration;Verbal cues required;Tactile cues required;Need further instruction       PT Short Term Goals - 11/08/17 1447      PT SHORT TERM GOAL #1   Title  Pt and husband will report compliance with HEP    Baseline  not compliant    Status  Not Met      PT SHORT TERM GOAL #2   Title  Pt will demo proper use of SPC and verbalize comfort and safety during daily activities    Baseline  loses cane at home but overal feels comfortable    Status  Achieved      PT SHORT TERM GOAL #3   Title  pt will demo proper upright posture with minimal cuing    Baseline  reminders to do so but is able to achieve proper alignment independently    Status  Achieved        PT Long Term Goals - 11/08/17 1420      PT LONG TERM GOAL #1   Title  BERG score to improve by appropriate MDC    Baseline  MDC 3.3    Status  Achieved      PT LONG TERM GOAL #2   Title  Pt will demo safety on steps/stairs with minimal UE support    Baseline  reports  she is being more careful, reports moderate support from railing    Status  On-going      PT LONG TERM GOAL #3   Title  Pt will be able to drive without limitation by cervical pain    Baseline  reports she is able to turn and look without pain, not driving at this time    Status  Partially Met      PT LONG TERM GOAL #4    Title  Pt will be able to bend to lift objects such as laundry basket without limitation by back/neck pain    Baseline  8/10 still present with lifting laundry basket    Status  On-going      PT LONG TERM GOAL #5   Title  Pt will be able to complete household chores with pain <=2/10 and verbalize proper safety precautions to avoid falls    Baseline  up to 8/10 when lifting laundry basket    Status  On-going            Plan - 11/08/17 1500    Clinical Impression Statement  Pt is making progress toward goals as expected at this point in POC. Demo improved stability but would benefit from further challenges in dynamic situations. Lifting cont to be difficult and asked her to use a smaller laundry basketor one with wheels.  Husband is worried that she recently seems to forget her posture more often.     PT Treatment/Interventions  ADLs/Self Care Home Management;Cryotherapy;Electrical Stimulation;Moist Heat;Traction;Therapeutic exercise;Therapeutic activities;Functional mobility training;Stair training;Gait training;Balance training;Neuromuscular re-education;Patient/family education;Manual techniques;Taping;Dry needling;Passive range of motion    PT Next Visit Plan  give exercise log, lifting, dynamic balance, stairs    PT Home Exercise Plan  scapular retraction, LTR, bridge, clam, chin tucks, supine horiz abd, supine GHJ flx stretch,  YOGA walking, sit<>stand without UE,  hamstring stretch    Consulted and Agree with Plan of Care  Patient    Family Member Consulted  husband       Patient will benefit from skilled therapeutic intervention in order to improve the following deficits and impairments:  Abnormal gait, Decreased endurance, Decreased activity tolerance, Decreased strength, Pain, Increased muscle spasms, Decreased balance, Decreased range of motion, Impaired flexibility, Postural dysfunction  Visit Diagnosis: Chronic bilateral low back pain without  sciatica  Cervicalgia  Repeated falls     Problem List Patient Active Problem List   Diagnosis Date Noted  . Gait abnormality 08/30/2017  . Lumbar radiculopathy 08/30/2017  . Chronic left-sided low back pain with left-sided sciatica 07/21/2017  . Personal history of calcium pyrophosphate deposition disease (CPPD) 07/29/2016  . History of peripheral neuropathy 07/29/2016  . History of total knee replacement, right 07/22/2016  . Pseudogout 01/22/2016  . Primary osteoarthritis of both knees 01/22/2016  . Primary osteoarthritis of both hands 01/22/2016  . Primary osteoarthritis of both feet 01/22/2016  . Fibromyalgia 01/22/2016  . Primary insomnia 01/22/2016  . Obesity (BMI 30-39.9) 04/01/2014  . Obstructive sleep apnea 04/19/2013  . Essential hypertension, benign 04/19/2013  . Memory loss 08/02/2012   Serenna Deroy C. Raul Torrance PT, DPT 11/08/17 5:56 PM   [place] [facility] [address] [place], [place], [address] Phone: [phone]   Fax:  [phone]  Name: Lynn Price MRN: [id number] Date of Birth: 1941-01-12

## 2017-11-09 ENCOUNTER — Other Ambulatory Visit (INDEPENDENT_AMBULATORY_CARE_PROVIDER_SITE_OTHER): Payer: Self-pay | Admitting: Orthopaedic Surgery

## 2017-11-09 NOTE — Telephone Encounter (Signed)
PLEASE ADVISE.

## 2017-11-09 NOTE — Telephone Encounter (Signed)
PCP needs to renew gabapentin

## 2017-11-10 NOTE — Telephone Encounter (Signed)
As above-PCP to refill on long term basis

## 2017-11-13 ENCOUNTER — Encounter: Payer: Self-pay | Admitting: Physical Therapy

## 2017-11-13 ENCOUNTER — Ambulatory Visit: Payer: Medicare Other | Admitting: Physical Therapy

## 2017-11-13 ENCOUNTER — Other Ambulatory Visit (INDEPENDENT_AMBULATORY_CARE_PROVIDER_SITE_OTHER): Payer: Self-pay | Admitting: Orthopaedic Surgery

## 2017-11-13 DIAGNOSIS — R296 Repeated falls: Secondary | ICD-10-CM

## 2017-11-13 DIAGNOSIS — M542 Cervicalgia: Secondary | ICD-10-CM

## 2017-11-13 DIAGNOSIS — G8929 Other chronic pain: Secondary | ICD-10-CM

## 2017-11-13 DIAGNOSIS — M545 Low back pain, unspecified: Secondary | ICD-10-CM

## 2017-11-13 NOTE — Therapy (Signed)
[place], [place], [address] Phone: [phone]   Fax:  [phone]  Physical Therapy Treatment  Patient Details  Name: Lynn Price MRN: [id number] Date of Birth: Feb 03, 1941 Referring Provider: Garald Balding, MD   Encounter Date: 11/13/2017  PT End of Session - 11/13/17 1512    Visit Number  10    Number of Visits  17    Date for PT Re-Evaluation  12/01/17    Authorization Type  UHC MCR    PN at visit 10, KX at visit 15    PT Start Time  1419    PT Stop Time  1501    PT Time Calculation (min)  42 min    Activity Tolerance  Patient tolerated treatment well;No increased pain    Behavior During Therapy  [facility] for tasks assessed/performed       Past Medical History:  Diagnosis Date  . Anxiety   . Fibromyalgia   . GERD (gastroesophageal reflux disease)   . High blood pressure   . HSV (herpes simplex virus) anogenital infection   . IBS (irritable bowel syndrome)   . Memory loss   . Neuropathy   . Obesity (BMI 30-39.9) 04/01/2014  . Osteoarthritis   . Overactive bladder   . Restless leg syndrome   . Sleep apnea     Past Surgical History:  Procedure Laterality Date  . APPENDECTOMY    . CATARACT EXTRACTION Left   . CESAREAN SECTION    . CHOLECYSTECTOMY    . colonscopy    . MULTIPLE TOOTH EXTRACTIONS    . ROTATOR CUFF REPAIR Left   . TOTAL KNEE ARTHROPLASTY      There were no vitals filed for this visit.  Subjective Assessment - 11/13/17 1424    Subjective    Pain 7/10 now.  I was able to walk 15 + minutes with low back pain.    Patient is accompained by:  Family member    Currently in Pain?  Yes    Pain Score  7     Pain Location  Back    Pain Orientation  Lower    Pain Descriptors / Indicators  Sore    Aggravating Factors   walking longer,  lifting laundry basket.     Pain Relieving Factors  rest.    Multiple Pain Sites  --   legs a little sore sometimes,  muscular                       [facility] Adult PT Treatment/Exercise - 11/13/17 0001      Lumbar Exercises: Aerobic   Nustep  5 minutes UE/LE  level 5      Lumbar Exercises: Supine   Clam  10 reps    Clam Limitations  red band      Knee/Hip Exercises: Stretches   Passive Hamstring Stretch  3 reps;10 seconds   both,  cued for head position     Knee/Hip Exercises: Standing   Other Standing Knee Exercises  stairs 4 steps 6 inches with cane and no hand,  able to go step over step most of the time .    Other Standing Knee Exercises  YOGA walking  9meet X 2,  cued initially      Knee/Hip Exercises: Seated   Sit to Sand  10 reps   cued first few     Knee/Hip Exercises: Supine   Bridges  10 reps  Added to HEP     Shoulder Exercises: Supine   Flexion Limitations  10 x shoulder    GHJ flexion,  AROM   Other Supine Exercises  difficult able to do with extra pillow      Shoulder Exercises: Seated   Retraction  10 reps    Retraction Limitations  cues    External Rotation  10 reps   AROM   Other Seated Exercises  chin tuck  could not do with cues  difficult.               PT Education - 11/13/17 1503    Education Details  How to use exercise log,  HEP    Person(s) Educated  Patient;Spouse    Methods  Explanation;Demonstration;Handout    Comprehension  Verbalized understanding;Returned demonstration       PT Short Term Goals - 11/13/17 1516      PT SHORT TERM GOAL #1   Title  Pt and husband will report compliance with HEP    Baseline  not compliant,  was able to report walking 15 + minutes over weekend.    Time  4    Period  Weeks    Status  On-going      PT SHORT TERM GOAL #2   Title  Pt will demo proper use of SPC and verbalize comfort and safety during daily activities    Baseline  loses cane at home but overal feels comfortable    Time  4    Period  Weeks    Status  Achieved      PT SHORT TERM GOAL #3   Title  pt will demo proper upright posture with  minimal cuing    Baseline  reminders to do so but is able to achieve proper alignment independently    Time  4    Period  Weeks    Status  Achieved        PT Long Term Goals - 11/13/17 1517      PT LONG TERM GOAL #1   Title  BERG score to improve by appropriate MDC    Time  8    Period  Weeks    Status  Achieved      PT LONG TERM GOAL #2   Title  Pt will demo safety on steps/stairs with minimal UE support    Baseline  able to demo safety on steps 6" 4" with cane and no rail in clinic.     Time  8    Period  Weeks    Status  Achieved      PT LONG TERM GOAL #3   Title  Pt will be able to drive without limitation by cervical pain    Time  8    Status  Unable to assess      PT LONG TERM GOAL #4   Title  Pt will be able to bend to lift objects such as laundry basket without limitation by back/neck pain    Baseline  8/10 still present with lifting laundry basket    Time  8    Period  Weeks    Status  On-going      PT LONG TERM GOAL #5   Title  Pt will be able to complete household chores with pain <=2/10 and verbalize proper safety precautions to avoid falls    Baseline  up to 8/10 when lifting laundry basket    Time  8  Period  Weeks    Status  On-going            Plan - 11/13/17 1513    Clinical Impression Statement  Patient was issued a HEP log for home use and most exercises reviewed and log filled out.  Patient needed moerate cues for most of the HEP today, slightly more than she ususlly needs. She was able to demonstrate safety on steps, 4 and 6 inches with cane and no rail.  LTG#2 met.     PT Next Visit Plan  Answer any exercise log questions, lifting ( simulate laundry) , dynamic balance, stairs    PT Home Exercise Plan  scapular retraction, LTR, bridge, clam, chin tucks, supine horiz abd, supine GHJ flx stretch,  YOGA walking, sit<>stand without UE,  hamstring stretch    Consulted and Agree with Plan of Care  Patient    Family Member Consulted  husband        Patient will benefit from skilled therapeutic intervention in order to improve the following deficits and impairments:     Visit Diagnosis: Chronic bilateral low back pain without sciatica  Cervicalgia  Repeated falls     Problem List Patient Active Problem List   Diagnosis Date Noted  . Gait abnormality 08/30/2017  . Lumbar radiculopathy 08/30/2017  . Chronic left-sided low back pain with left-sided sciatica 07/21/2017  . Personal history of calcium pyrophosphate deposition disease (CPPD) 07/29/2016  . History of peripheral neuropathy 07/29/2016  . History of total knee replacement, right 07/22/2016  . Pseudogout 01/22/2016  . Primary osteoarthritis of both knees 01/22/2016  . Primary osteoarthritis of both hands 01/22/2016  . Primary osteoarthritis of both feet 01/22/2016  . Fibromyalgia 01/22/2016  . Primary insomnia 01/22/2016  . Obesity (BMI 30-39.9) 04/01/2014  . Obstructive sleep apnea 04/19/2013  . Essential hypertension, benign 04/19/2013  . Memory loss 08/02/2012    Tyeler Goedken PTA 11/13/2017, 3:20 PM  [facility] [address] [place], [place], [address] Phone: [phone]   Fax:  [phone]  Name: Lynn Price MRN: [id number] Date of Birth: 1940/10/05

## 2017-11-13 NOTE — Patient Instructions (Addendum)
Bridge    Lie back, legs bent. Inhale, pressing hips up.. Exhale  as you lower. Repeat __10__ times. Do __1__ sessions per day.  http://pm.exer.us/55   Copyright  VHI. All rights reserved.  Exercise log issued from Exercise drawer. She is to use to record current HEP

## 2017-11-14 NOTE — Telephone Encounter (Signed)
Pt requesting refill. It was last filled 07/21/17

## 2017-11-14 NOTE — Telephone Encounter (Signed)
Need to have Dr Link Snuffereveschwar do the Rx for gabapentin

## 2017-11-14 NOTE — Telephone Encounter (Signed)
Ok to refill 

## 2017-11-14 NOTE — Telephone Encounter (Signed)
PLEASE ADVISE. PT NEEDS REFILL OF MEDS. [facility] PERSCRIBED THEM TO HER LAST TIME BUT TOLD HER SHE NEEDS TO GET MEDS IN FUTURE FROM ORIGINAL ORDERING DR

## 2017-11-15 ENCOUNTER — Encounter: Payer: Self-pay | Admitting: Physical Therapy

## 2017-11-15 ENCOUNTER — Ambulatory Visit: Payer: Medicare Other | Admitting: Physical Therapy

## 2017-11-15 DIAGNOSIS — M542 Cervicalgia: Secondary | ICD-10-CM

## 2017-11-15 DIAGNOSIS — M545 Low back pain, unspecified: Secondary | ICD-10-CM

## 2017-11-15 DIAGNOSIS — G8929 Other chronic pain: Secondary | ICD-10-CM

## 2017-11-15 DIAGNOSIS — R296 Repeated falls: Secondary | ICD-10-CM

## 2017-11-15 NOTE — Therapy (Signed)
[facility] [address] [place], [place], [address] Phone: [phone]   Fax:  [phone]  Physical Therapy Treatment  Patient Details  Name: Lynn Price MRN: [id number] Date of Birth: 1941-01-13 Referring Provider: Valeria Batman, MD   Encounter Date: 11/15/2017  PT End of Session - 11/15/17 1459    Visit Number  11    Number of Visits  17    Date for PT Re-Evaluation  12/01/17    Authorization Type  UHC MCR    PN at visit 10, KX at visit 15    PT Start Time  1420   pt arrived late   PT Stop Time  1458    PT Time Calculation (min)  38 min    Activity Tolerance  Patient tolerated treatment well    Behavior During Therapy  [facility] for tasks assessed/performed       Past Medical History:  Diagnosis Date  . Anxiety   . Fibromyalgia   . GERD (gastroesophageal reflux disease)   . High blood pressure   . HSV (herpes simplex virus) anogenital infection   . IBS (irritable bowel syndrome)   . Memory loss   . Neuropathy   . Obesity (BMI 30-39.9) 04/01/2014  . Osteoarthritis   . Overactive bladder   . Restless leg syndrome   . Sleep apnea     Past Surgical History:  Procedure Laterality Date  . APPENDECTOMY    . CATARACT EXTRACTION Left   . CESAREAN SECTION    . CHOLECYSTECTOMY    . colonscopy    . MULTIPLE TOOTH EXTRACTIONS    . ROTATOR CUFF REPAIR Left   . TOTAL KNEE ARTHROPLASTY      There were no vitals filed for this visit.  Subjective Assessment - 11/15/17 1423    Subjective  My back is hurting a little. Sunday I walked a lot and yesterday I did some things with my legs- when asked if she is doing her HEP. We have our house on the market.     Patient Stated Goals  2 steps at home with one railing, turn head side/side, bending, housework                       [facility] Adult PT Treatment/Exercise - 11/15/17 0001      Exercises   Exercises  Lumbar      Lumbar Exercises: Stretches   Single Knee to Chest Stretch  Right;Left;20 seconds    Lower Trunk Rotation  Other (comment)   10 reps     Lumbar Exercises: Aerobic   Nustep  5 minutes UE/LE  level 5      Lumbar Exercises: Standing   Wall Slides Limitations  wall angels    Other Standing Lumbar Exercises  shoulder horiz abd yellow    Other Standing Lumbar Exercises  shoulder diagonals yellow      Knee/Hip Exercises: Stretches   Other Knee/Hip Stretches  gastroc stretch on slant board      Knee/Hip Exercises: Standing   Heel Raises Limitations  x20 with UE support      Shoulder Exercises: Supine   External Rotation  15 reps    Theraband Level (Shoulder External Rotation)  Level 1 (Yellow)    Flexion Limitations  with horiz abd pull on tband      Manual Therapy   Manual Therapy  Soft tissue mobilization    Soft tissue mobilization  STM & IASTM bil QL,  glut max, lumbar paraspinals               PT Short Term Goals - 11/13/17 1516      PT SHORT TERM GOAL #1   Title  Pt and husband will report compliance with HEP    Baseline  not compliant,  was able to report walking 15 + minutes over weekend.    Time  4    Period  Weeks    Status  On-going      PT SHORT TERM GOAL #2   Title  Pt will demo proper use of SPC and verbalize comfort and safety during daily activities    Baseline  loses cane at home but overal feels comfortable    Time  4    Period  Weeks    Status  Achieved      PT SHORT TERM GOAL #3   Title  pt will demo proper upright posture with minimal cuing    Baseline  reminders to do so but is able to achieve proper alignment independently    Time  4    Period  Weeks    Status  Achieved        PT Long Term Goals - 11/13/17 1517      PT LONG TERM GOAL #1   Title  BERG score to improve by appropriate MDC    Time  8    Period  Weeks    Status  Achieved      PT LONG TERM GOAL #2   Title  Pt will demo safety on steps/stairs with minimal UE support    Baseline  able to demo safety  on steps 6" 4" with cane and no rail in clinic.     Time  8    Period  Weeks    Status  Achieved      PT LONG TERM GOAL #3   Title  Pt will be able to drive without limitation by cervical pain    Time  8    Status  Unable to assess      PT LONG TERM GOAL #4   Title  Pt will be able to bend to lift objects such as laundry basket without limitation by back/neck pain    Baseline  8/10 still present with lifting laundry basket    Time  8    Period  Weeks    Status  On-going      PT LONG TERM GOAL #5   Title  Pt will be able to complete household chores with pain <=2/10 and verbalize proper safety precautions to avoid falls    Baseline  up to 8/10 when lifting laundry basket    Time  8    Period  Weeks    Status  On-going            Plan - 11/15/17 1456    Clinical Impression Statement  Exercises focused toward upright posture through thoracic spine. pt husband reports her posture has gotten significantly worse recently. Reported decrease in LBP with manual therapy.     PT Treatment/Interventions  ADLs/Self Care Home Management;Cryotherapy;Electrical Stimulation;Moist Heat;Traction;Therapeutic exercise;Therapeutic activities;Functional mobility training;Stair training;Gait training;Balance training;Neuromuscular re-education;Patient/family education;Manual techniques;Taping;Dry needling;Passive range of motion    PT Next Visit Plan  Answer any exercise log questions, lifting ( simulate laundry) , dynamic balance, stairs    PT Home Exercise Plan  scapular retraction, LTR, bridge, clam, chin tucks, supine horiz abd, supine GHJ flx stretch,  YOGA walking, sit<>stand without UE,  hamstring stretch, standing horiz abd & diagonals, wall angels    Consulted and Agree with Plan of Care  Patient    Family Member Consulted  husband       Patient will benefit from skilled therapeutic intervention in order to improve the following deficits and impairments:  Abnormal gait, Decreased endurance,  Decreased activity tolerance, Decreased strength, Pain, Increased muscle spasms, Decreased balance, Decreased range of motion, Impaired flexibility, Postural dysfunction  Visit Diagnosis: Chronic bilateral low back pain without sciatica  Cervicalgia  Repeated falls     Problem List Patient Active Problem List   Diagnosis Date Noted  . Gait abnormality 08/30/2017  . Lumbar radiculopathy 08/30/2017  . Chronic left-sided low back pain with left-sided sciatica 07/21/2017  . Personal history of calcium pyrophosphate deposition disease (CPPD) 07/29/2016  . History of peripheral neuropathy 07/29/2016  . History of total knee replacement, right 07/22/2016  . Pseudogout 01/22/2016  . Primary osteoarthritis of both knees 01/22/2016  . Primary osteoarthritis of both hands 01/22/2016  . Primary osteoarthritis of both feet 01/22/2016  . Fibromyalgia 01/22/2016  . Primary insomnia 01/22/2016  . Obesity (BMI 30-39.9) 04/01/2014  . Obstructive sleep apnea 04/19/2013  . Essential hypertension, benign 04/19/2013  . Memory loss 08/02/2012    Khayman Kirsch C. Jakwan Sally PT, DPT 11/15/17 3:00 PM   [facility] [address] [place], [place], [address] Phone: [phone]   Fax:  [phone]  Name: Lynn Price MRN: [id number] Date of Birth: February 18, 1941

## 2017-11-17 ENCOUNTER — Other Ambulatory Visit: Payer: Self-pay

## 2017-11-17 ENCOUNTER — Emergency Department (HOSPITAL_COMMUNITY)
Admission: EM | Admit: 2017-11-17 | Discharge: 2017-11-17 | Disposition: A | Discharge status: Home / Self Care | Payer: Medicare Other | Attending: Emergency Medicine | Admitting: Emergency Medicine

## 2017-11-17 ENCOUNTER — Encounter (HOSPITAL_COMMUNITY): Payer: Self-pay | Admitting: Emergency Medicine

## 2017-11-17 DIAGNOSIS — Z79899 Other long term (current) drug therapy: Secondary | ICD-10-CM | POA: Insufficient documentation

## 2017-11-17 DIAGNOSIS — R0989 Other specified symptoms and signs involving the circulatory and respiratory systems: Secondary | ICD-10-CM | POA: Diagnosis not present

## 2017-11-17 NOTE — Discharge Instructions (Addendum)
It appears that you had a choking episode today causing your discomfort.  Be careful when eating to make sure that you can swallow your food.  Always drink plenty of water after eating.  Check with your doctor next week to make sure you are okay.  Return here if needed.

## 2017-11-17 NOTE — ED Triage Notes (Signed)
Pt arrives via EMS from hair salon where she reports being laid back and suddenly got anxious, SOB and some chest pain when breathing heavily. Pt reports hx of anxiety. Husband reports pt did report some central CP last night.

## 2017-11-17 NOTE — ED Notes (Signed)
Pt verbalized understanding of discharge paperwork. Discharged home with husband

## 2017-11-17 NOTE — ED Provider Notes (Signed)
[facility]ENCY [facility] Provider Note   CSN: [id number] Arrival date & time: 11/17/17  1246     History   Chief Complaint Chief Complaint  Patient presents with  . Shortness of Breath    HPI Lynn Price is a 77 y.o. female.  HPI   She developed a choking sensation while trying to rapidly eat a banana just prior to getting her hair done.  He felt weak and had to be assisted to the floor.  She did not vomit.  She did not lose consciousness.  She was transferred by EMS.  On arrival here she was alone, her husband came later and found her to be at her baseline.  The patient denies headache, neck pain, chest pain, cough, shortness of breath, back or abdominal pain.  She has occasionally had problems like this in the past.  No other recent illnesses.  She is taking her usual medications.  There are no other known modifying factors.  Past Medical History:  Diagnosis Date  . Anxiety   . Fibromyalgia   . GERD (gastroesophageal reflux disease)   . High blood pressure   . HSV (herpes simplex virus) anogenital infection   . IBS (irritable bowel syndrome)   . Memory loss   . Neuropathy   . Obesity (BMI 30-39.9) 04/01/2014  . Osteoarthritis   . Overactive bladder   . Restless leg syndrome   . Sleep apnea     Patient Active Problem List   Diagnosis Date Noted  . Gait abnormality 08/30/2017  . Lumbar radiculopathy 08/30/2017  . Chronic left-sided low back pain with left-sided sciatica 07/21/2017  . Personal history of calcium pyrophosphate deposition disease (CPPD) 07/29/2016  . History of peripheral neuropathy 07/29/2016  . History of total knee replacement, right 07/22/2016  . Pseudogout 01/22/2016  . Primary osteoarthritis of both knees 01/22/2016  . Primary osteoarthritis of both hands 01/22/2016  . Primary osteoarthritis of both feet 01/22/2016  . Fibromyalgia 01/22/2016  . Primary insomnia 01/22/2016  . Obesity (BMI 30-39.9) 04/01/2014  .  Obstructive sleep apnea 04/19/2013  . Essential hypertension, benign 04/19/2013  . Memory loss 08/02/2012    Past Surgical History:  Procedure Laterality Date  . APPENDECTOMY    . CATARACT EXTRACTION Left   . CESAREAN SECTION    . CHOLECYSTECTOMY    . colonscopy    . MULTIPLE TOOTH EXTRACTIONS    . ROTATOR CUFF REPAIR Left   . TOTAL KNEE ARTHROPLASTY       OB History   None      Home Medications    Prior to Admission medications   Medication Sig Start Date End Date Taking? Authorizing Provider  acetaminophen (TYLENOL) 500 MG tablet Take 325 mg by mouth 2 (two) times daily as needed for mild pain.    Yes [provider]  acyclovir (ZOVIRAX) 400 MG tablet Take 400 mg by mouth daily. 11/16/17  Yes [provider]  cetirizine (ZYRTEC) 10 MG tablet Take 10 mg by mouth daily.   Yes [provider]  cholecalciferol (VITAMIN D) 1000 units tablet Take 1,000 Units by mouth daily.   Yes [provider]  dorzolamide-timolol (COSOPT) 22.3-6.8 MG/ML ophthalmic solution Place 2 drops into the right eye at bedtime.  08/15/17  Yes [provider]  fluticasone (FLONASE) 50 MCG/ACT nasal spray Place 2 sprays into both nostrils daily.    Yes [provider]  gabapentin (NEURONTIN) 300 MG capsule TAKE 2 CAPSULES BY MOUTH  TWO TIMES DAILY Patient taking differently: Take 600 mg by mouth 2 (two) times daily.  11/14/17  Yes Gearldine Bienenstock, PA-C  LORazepam (ATIVAN) 1 MG tablet Take 1 mg by mouth 2 (two) times daily as needed for anxiety.   Yes [provider]  mometasone (ELOCON) 0.1 % cream Apply 1 application topically daily.  03/27/17  Yes [provider]  Omega-3 Fatty Acids (FISH OIL) 1200 MG CAPS Take 1,200 mg by mouth daily.    Yes [provider]  Omeprazole (HM OMEPRAZOLE) 20 MG TBEC Take 20 mg by mouth 2 (two) times daily.    Yes [provider]  ondansetron (ZOFRAN) 4 MG tablet Take 4 mg by mouth every 8  (eight) hours as needed.  05/22/17  Yes [provider]  QUEtiapine (SEROQUEL) 25 MG tablet Take 25 mg by mouth 2 (two) times daily.  05/22/17  Yes [provider]  ranitidine (ZANTAC) 150 MG capsule Take 150 mg by mouth every evening.    Yes [provider]  valACYclovir (VALTREX) 500 MG tablet Take 500 mg by mouth 2 (two) times daily.  05/16/13  Yes [provider]  venlafaxine XR (EFFEXOR XR) 150 MG 24 hr capsule Take 150 mg by mouth daily.   Yes [provider]  cephALEXin (KEFLEX) 500 MG capsule Take 500 mg by mouth 3 (three) times daily. 11/10/17   [provider]  diltiazem (CARDIZEM CD) 120 MG 24 hr capsule Take 120 mg by mouth daily.    [provider]    Family History Family History  Problem Relation Age of Onset  . Depression Mother   . Depression Father   . Mental illness Sister   . Mental illness Sister     Social History Social History   Tobacco Use  . Smoking status: Never Smoker  . Smokeless tobacco: Never Used  Substance Use Topics  . Alcohol use: Yes    Comment: wine occas  . Drug use: No     Allergies   Erythromycin; Penicillins; Sulfa antibiotics; and Tetracyclines & related   Review of Systems Review of Systems  All other systems reviewed and are negative.    Physical Exam Updated Vital Signs BP (!) 145/72   Pulse 69   Temp 97.6 F (36.4 C) (Oral)   Resp (!) 21   Ht 5\' 5"  (1.651 m)   Wt 59.4 kg   SpO2 97%   BMI 21.80 kg/m   Physical Exam  Constitutional: She appears well-developed.  Elderly, frail  HENT:  Head: Normocephalic and atraumatic.  Eyes: Pupils are equal, round, and reactive to light. Conjunctivae and EOM are normal.  Neck: Normal range of motion and phonation normal. Neck supple.  Cardiovascular: Normal rate and regular rhythm.  Pulmonary/Chest: Effort normal and breath sounds normal. She exhibits no tenderness.  Abdominal: Soft. She exhibits no distension. There  is no tenderness. There is no guarding.  Musculoskeletal: Normal range of motion.  Neurological: She is alert. She exhibits normal muscle tone.  Skin: Skin is warm and dry.  Psychiatric: She has a normal mood and affect. Her behavior is normal.  Nursing note and vitals reviewed.    ED Treatments / Results  Labs (all labs ordered are listed, but only abnormal results are displayed) Labs Reviewed - No data to display  EKG EKG Interpretation  Date/Time:  Friday November 17 2017 12:54:07 EDT Ventricular Rate:  75 PR Interval:    QRS Duration: 109 QT Interval:  383  QTC Calculation: 428 R Axis:   -39 Text Interpretation:  Sinus rhythm Left axis deviation Low voltage, precordial leads RSR' in V1 or V2, right VCD or RVH Consider anterior infarct since last tracing no significant change Confirmed by Mancel Bale [phone]) on 11/17/2017 1:40:41 PM   Radiology No results found.  Procedures Procedures (including critical care time)  Medications Ordered in ED Medications - No data to display   Initial Impression / Assessment and Plan / ED Course  I have reviewed the triage vital signs and the nursing notes.  Pertinent labs & imaging results that were available during my care of the patient were reviewed by me and considered in my medical decision making (see chart for details).     Patient Vitals for the past 24 hrs:  BP Temp Temp src Pulse Resp SpO2 Height Weight  11/17/17 1430 (!) 145/72 - - 69 (!) 21 97 % - -  11/17/17 1415 138/68 - - 70 (!) 23 96 % - -  11/17/17 1345 (!) 146/67 - - 67 13 96 % - -  11/17/17 1330 (!) 142/73 - - 70 17 98 % - -  11/17/17 1315 137/76 - - 69 17 100 % - -  11/17/17 1300 (!) 142/77 - - 72 17 97 % - -  11/17/17 1256 - - - - - - 5\' 5"  (1.651 m) 59.4 kg  11/17/17 1250 (!) 146/90 97.6 F (36.4 C) Oral 75 12 98 % - -  11/17/17 1249 - - - - - 95 % - -      CRITICAL CARE- No Performed by: Mancel Bale    3:30 PM Reevaluation with update and  discussion. After initial assessment and treatment, an updated evaluation reveals she is eating heartily, comfortable and calm.  Patient's husband continues to assert that she is at her baseline.  Findings discussed and questions answered. Mancel Bale   Medical Decision Making: Malaise with choking episode, resolved.  Doubt esophageal obstruction, serious bacterial infection, metabolic instability or impending vascular collapse.  Nursing Notes Reviewed/ Care Coordinated Applicable Imaging Reviewed Interpretation of Laboratory Data incorporated into ED treatment  The patient appears reasonably screened and/or stabilized for discharge and I doubt any other medical condition or other [facility] requiring further screening, evaluation, or treatment in the ED at this time prior to discharge.  Plan: Home Medications-continue usual medications; Home Treatments-rest, fluids; return here if the recommended treatment, does not improve the symptoms; Recommended follow up-PCP checkup 1 week and as needed   Final Clinical Impressions(s) / ED Diagnoses   Final diagnoses:  Choking episode    ED Discharge Orders    None       Mancel Bale, MD 11/17/17 1538

## 2017-11-20 ENCOUNTER — Ambulatory Visit: Payer: Medicare Other | Admitting: Physical Therapy

## 2017-11-20 ENCOUNTER — Encounter: Payer: Self-pay | Admitting: Physical Therapy

## 2017-11-20 DIAGNOSIS — M545 Low back pain, unspecified: Secondary | ICD-10-CM

## 2017-11-20 DIAGNOSIS — G8929 Other chronic pain: Secondary | ICD-10-CM

## 2017-11-20 DIAGNOSIS — M542 Cervicalgia: Secondary | ICD-10-CM

## 2017-11-20 DIAGNOSIS — R296 Repeated falls: Secondary | ICD-10-CM

## 2017-11-20 NOTE — Therapy (Signed)
[place], [place], [address] Phone: [phone]   Fax:  [phone]  Physical Therapy Treatment  Patient Details  Name: Lynn Price MRN: [id number] Date of Birth: December 25, 1940 Referring Provider: Garald Balding, MD   Encounter Date: 11/20/2017  PT End of Session - 11/20/17 1827    Visit Number  12    Number of Visits  17    Date for PT Re-Evaluation  12/01/17    PT Start Time  2353    PT Stop Time  1500    PT Time Calculation (min)  38 min    Activity Tolerance  Patient tolerated treatment well    Behavior During Therapy  [facility] for tasks assessed/performed       Past Medical History:  Diagnosis Date  . Anxiety   . Fibromyalgia   . GERD (gastroesophageal reflux disease)   . High blood pressure   . HSV (herpes simplex virus) anogenital infection   . IBS (irritable bowel syndrome)   . Memory loss   . Neuropathy   . Obesity (BMI 30-39.9) 04/01/2014  . Osteoarthritis   . Overactive bladder   . Restless leg syndrome   . Sleep apnea     Past Surgical History:  Procedure Laterality Date  . APPENDECTOMY    . CATARACT EXTRACTION Left   . CESAREAN SECTION    . CHOLECYSTECTOMY    . colonscopy    . MULTIPLE TOOTH EXTRACTIONS    . ROTATOR CUFF REPAIR Left   . TOTAL KNEE ARTHROPLASTY      There were no vitals filed for this visit.  Subjective Assessment - 11/20/17 1821    Subjective  I had to go to the ER,  I chocked on a bananna.  I feel kind of weak today.  I did some of the exercises.  i did not use the exercise log because i did not understand it.     Patient is accompained by:  Family member    Currently in Pain?  No/denies    Pain Location  Back    Pain Orientation  Lower    Aggravating Factors   She did not say    Pain Relieving Factors  rest                       [facility] Adult PT Treatment/Exercise - 11/20/17 0001      Lumbar Exercises: Stretches   Passive  Hamstring Stretch  Right;Left;3 reps;20 seconds    Passive Hamstring Stretch Limitations  cues needed    Lower Trunk Rotation  5 reps      Lumbar Exercises: Aerobic   Nustep  5 minutes L4 UE/LE      Knee/Hip Exercises: Seated   Sit to [employer]  10 reps      Knee/Hip Exercises: Supine   Bridges  10 reps      Shoulder Exercises: Supine   Flexion  5 reps   supine shoulder flexion stretch   Flexion Limitations  with horiz abd pull on tband   review   Other Supine Exercises  retraction sitting and supine   also chin tucks            PT Education - 11/20/17 1827    Education Details  HEP,  exercise log use    Person(s) Educated  Patient;Spouse    Methods  Explanation;Demonstration;Tactile cues;Verbal cues    Comprehension  Verbalized understanding;Returned demonstration;Need further instruction  PT Short Term Goals - 11/20/17 1831      PT SHORT TERM GOAL #1   Title  Pt and husband will report compliance with HEP    Baseline  Patient is doing some of the exercises ,  Husband confirms.    Time  4    Period  Weeks    Status  Partially Met      PT SHORT TERM GOAL #2   Title  Pt will demo proper use of SPC and verbalize comfort and safety during daily activities    Time  4    Period  Weeks    Status  Achieved      PT SHORT TERM GOAL #3   Title  pt will demo proper upright posture with minimal cuing    Time  4    Period  Weeks    Status  Achieved        PT Long Term Goals - 11/20/17 1832      PT LONG TERM GOAL #1   Title  BERG score to improve by appropriate MDC    Time  8    Period  Weeks    Status  Achieved      PT LONG TERM GOAL #2   Title  Pt will demo safety on steps/stairs with minimal UE support    Time  8    Period  Weeks    Status  Achieved      PT LONG TERM GOAL #3   Title  Pt will be able to drive without limitation by cervical pain    Time  8    Period  Weeks    Status  Unable to assess      PT LONG TERM GOAL #4   Title  Pt will be  able to bend to lift objects such as laundry basket without limitation by back/neck pain    Time  8    Period  Weeks    Status  Unable to assess      PT LONG TERM GOAL #5   Title  Pt will be able to complete household chores with pain <=2/10 and verbalize proper safety precautions to avoid falls    Time  8    Period  Weeks    Status  Unable to assess            Plan - 11/20/17 1828    Clinical Impression Statement  Patient was able to exercise without increased pain.  She has not been documenting on the HEP due to not understanding it . STG#1 partly met.   Education continues with HEP. ( memory loss may be an issue) Husband noted for the first time  that Mrs Bieler standing posture is improving in sitting and standing.       PT Next Visit Plan  Review any exercise log questions, lifting ( simulate laundry) , dynamic balance, stairs    PT Home Exercise Plan  scapular retraction, LTR, bridge, clam, chin tucks, supine horiz abd, supine GHJ flx stretch,  YOGA walking, sit<>stand without UE,  hamstring stretch, standing horiz abd & diagonals, wall angels    Consulted and Agree with Plan of Care  Patient    Family Member Consulted  husband       Patient will benefit from skilled therapeutic intervention in order to improve the following deficits and impairments:     Visit Diagnosis: Chronic bilateral low back pain without sciatica  Cervicalgia  Repeated falls  Problem List Patient Active Problem List   Diagnosis Date Noted  . Gait abnormality 08/30/2017  . Lumbar radiculopathy 08/30/2017  . Chronic left-sided low back pain with left-sided sciatica 07/21/2017  . Personal history of calcium pyrophosphate deposition disease (CPPD) 07/29/2016  . History of peripheral neuropathy 07/29/2016  . History of total knee replacement, right 07/22/2016  . Pseudogout 01/22/2016  . Primary osteoarthritis of both knees 01/22/2016  . Primary osteoarthritis of both hands 01/22/2016  .  Primary osteoarthritis of both feet 01/22/2016  . Fibromyalgia 01/22/2016  . Primary insomnia 01/22/2016  . Obesity (BMI 30-39.9) 04/01/2014  . Obstructive sleep apnea 04/19/2013  . Essential hypertension, benign 04/19/2013  . Memory loss 08/02/2012    Clance Baquero PTA 11/20/2017, 6:35 PM  [facility] [address] [place], [place], [address] Phone: [phone]   Fax:  [phone]  Name: Lynn Price MRN: [id number] Date of Birth: 1940-12-11

## 2017-11-22 ENCOUNTER — Encounter: Payer: Self-pay | Admitting: Physical Therapy

## 2017-11-22 ENCOUNTER — Ambulatory Visit: Payer: Medicare Other | Admitting: Physical Therapy

## 2017-11-22 DIAGNOSIS — M545 Low back pain, unspecified: Secondary | ICD-10-CM

## 2017-11-22 DIAGNOSIS — M542 Cervicalgia: Secondary | ICD-10-CM

## 2017-11-22 DIAGNOSIS — G8929 Other chronic pain: Secondary | ICD-10-CM

## 2017-11-22 DIAGNOSIS — R296 Repeated falls: Secondary | ICD-10-CM

## 2017-11-22 NOTE — Therapy (Signed)
[place], [place], [address] Phone: [phone]   Fax:  [phone]  Physical Therapy Treatment  Patient Details  Name: Lynn Price MRN: [id number] Date of Birth: [birth year] Referring Provider: Garald Balding, MD   Encounter Date: 11/22/2017  PT End of Session - 11/22/17 1441    Visit Number  13    Number of Visits  17    Date for PT Re-Evaluation  12/01/17    Authorization Type  [employer] MCR    PN at visit 10, KX at visit 15    PT Start Time  1433   pt arrived late   PT Stop Time  1501    PT Time Calculation (min)  28 min    Activity Tolerance  Patient tolerated treatment well    Behavior During Therapy  [facility] for tasks assessed/performed       Past Medical History:  Diagnosis Date  . Anxiety   . Fibromyalgia   . GERD (gastroesophageal reflux disease)   . High blood pressure   . HSV (herpes simplex virus) anogenital infection   . IBS (irritable bowel syndrome)   . Memory loss   . Neuropathy   . Obesity (BMI 30-39.9) 04/01/2014  . Osteoarthritis   . Overactive bladder   . Restless leg syndrome   . Sleep apnea     Past Surgical History:  Procedure Laterality Date  . APPENDECTOMY    . CATARACT EXTRACTION Left   . CESAREAN SECTION    . CHOLECYSTECTOMY    . colonscopy    . MULTIPLE TOOTH EXTRACTIONS    . ROTATOR CUFF REPAIR Left   . TOTAL KNEE ARTHROPLASTY      There were no vitals filed for this visit.  Subjective Assessment - 11/22/17 1442    Subjective  feeling well today    Patient Stated Goals  2 steps at home with one railing, turn head side/side, bending, housework    Currently in Pain?  No/denies                       [facility] Adult PT Treatment/Exercise - 11/22/17 0001      Exercises   Exercises  Knee/Hip      Lumbar Exercises: Stretches   Lower Trunk Rotation  5 reps   x2     Lumbar Exercises: Aerobic   Nustep  5 min L5 UE & LE      Lumbar  Exercises: Supine   Bridge  15 reps      Knee/Hip Exercises: Seated   Long Arc Quad  Both;10 reps    Sit to [employer]  10 reps      Shoulder Exercises: Supine   Horizontal ABduction  15 reps    Theraband Level (Shoulder Horizontal ABduction)  Level 2 (Red)      Shoulder Exercises: Seated   Other Seated Exercises  diagonals red tband               PT Short Term Goals - 11/20/17 1831      PT SHORT TERM GOAL #1   Title  Pt and husband will report compliance with HEP    Baseline  Patient is doing some of the exercises ,  Husband confirms.    Time  4    Period  Weeks    Status  Partially Met      PT SHORT TERM GOAL #2   Title  Pt will demo  proper use of SPC and verbalize comfort and safety during daily activities    Time  4    Period  Weeks    Status  Achieved      PT SHORT TERM GOAL #3   Title  pt will demo proper upright posture with minimal cuing    Time  4    Period  Weeks    Status  Achieved        PT Long Term Goals - 11/20/17 1832      PT LONG TERM GOAL #1   Title  BERG score to improve by appropriate MDC    Time  8    Period  Weeks    Status  Achieved      PT LONG TERM GOAL #2   Title  Pt will demo safety on steps/stairs with minimal UE support    Time  8    Period  Weeks    Status  Achieved      PT LONG TERM GOAL #3   Title  Pt will be able to drive without limitation by cervical pain    Time  8    Period  Weeks    Status  Unable to assess      PT LONG TERM GOAL #4   Title  Pt will be able to bend to lift objects such as laundry basket without limitation by back/neck pain    Time  8    Period  Weeks    Status  Unable to assess      PT LONG TERM GOAL #5   Title  Pt will be able to complete household chores with pain <=2/10 and verbalize proper safety precautions to avoid falls    Time  8    Period  Weeks    Status  Unable to assess            Plan - 11/22/17 1502    Clinical Impression Statement  consolidated HEP so she did not  have so many papers. Advised that she needs to exercise at least 4 days/week and encouraged husband to do them with her.     PT Treatment/Interventions  ADLs/Self Care Home Management;Cryotherapy;Electrical Stimulation;Moist Heat;Traction;Therapeutic exercise;Therapeutic activities;Functional mobility training;Stair training;Gait training;Balance training;Neuromuscular re-education;Patient/family education;Manual techniques;Taping;Dry needling;Passive range of motion    PT Next Visit Plan  Review any exercise log questions, lifting ( simulate laundry) , dynamic balance, stairs- POC ends next week    PT Home Exercise Plan  scapular retraction, LTR, bridge, clam, standing horiz abd, supine GHJ flx stretch,  YOGA walking, sit<>stand without UE,  hamstring stretch, standing horiz abd & diagonals    Consulted and Agree with Plan of Care  Patient    Family Member Consulted  husband       Patient will benefit from skilled therapeutic intervention in order to improve the following deficits and impairments:  Abnormal gait, Decreased endurance, Decreased activity tolerance, Decreased strength, Pain, Increased muscle spasms, Decreased balance, Decreased range of motion, Impaired flexibility, Postural dysfunction  Visit Diagnosis: Chronic bilateral low back pain without sciatica  Cervicalgia  Repeated falls     Problem List Patient Active Problem List   Diagnosis Date Noted  . Gait abnormality 08/30/2017  . Lumbar radiculopathy 08/30/2017  . Chronic left-sided low back pain with left-sided sciatica 07/21/2017  . Personal history of calcium pyrophosphate deposition disease (CPPD) 07/29/2016  . History of peripheral neuropathy 07/29/2016  . History of total knee replacement, right 07/22/2016  . Pseudogout  01/22/2016  . Primary osteoarthritis of both knees 01/22/2016  . Primary osteoarthritis of both hands 01/22/2016  . Primary osteoarthritis of both feet 01/22/2016  . Fibromyalgia 01/22/2016  .  Primary insomnia 01/22/2016  . Obesity (BMI 30-39.9) 04/01/2014  . Obstructive sleep apnea 04/19/2013  . Essential hypertension, benign 04/19/2013  . Memory loss 08/02/2012    Nakiya Rallis C. Allycia Pitz PT, DPT 11/22/17 3:07 PM   [facility] [address] [place], [place], [address] Phone: [phone]   Fax:  [phone]  Name: Lynn Price MRN: [id number] Date of Birth: 1940/08/01

## 2017-11-27 ENCOUNTER — Ambulatory Visit: Payer: Medicare Other | Admitting: Physical Therapy

## 2017-11-27 ENCOUNTER — Encounter: Payer: Self-pay | Admitting: Physical Therapy

## 2017-11-27 DIAGNOSIS — G8929 Other chronic pain: Secondary | ICD-10-CM

## 2017-11-27 DIAGNOSIS — R296 Repeated falls: Secondary | ICD-10-CM

## 2017-11-27 DIAGNOSIS — M545 Low back pain, unspecified: Secondary | ICD-10-CM

## 2017-11-27 DIAGNOSIS — M542 Cervicalgia: Secondary | ICD-10-CM

## 2017-11-27 NOTE — Therapy (Signed)
[place], [place], [address] Phone: [phone]   Fax:  [phone]  Physical Therapy Treatment  Patient Details  Name: Lynn Price MRN: [id number] Date of Birth: 1940-09-30 Referring Provider (PT): Garald Balding, MD   Encounter Date: 11/27/2017  PT End of Session - 11/27/17 1632    Visit Number  14    Number of Visits  17    Date for PT Re-Evaluation  12/01/17    PT Start Time  0092    PT Stop Time  1500    PT Time Calculation (min)  47 min    Behavior During Therapy  [facility] for tasks assessed/performed       Past Medical History:  Diagnosis Date  . Anxiety   . Fibromyalgia   . GERD (gastroesophageal reflux disease)   . High blood pressure   . HSV (herpes simplex virus) anogenital infection   . IBS (irritable bowel syndrome)   . Memory loss   . Neuropathy   . Obesity (BMI 30-39.9) 04/01/2014  . Osteoarthritis   . Overactive bladder   . Restless leg syndrome   . Sleep apnea     Past Surgical History:  Procedure Laterality Date  . APPENDECTOMY    . CATARACT EXTRACTION Left   . CESAREAN SECTION    . CHOLECYSTECTOMY    . colonscopy    . MULTIPLE TOOTH EXTRACTIONS    . ROTATOR CUFF REPAIR Left   . TOTAL KNEE ARTHROPLASTY      There were no vitals filed for this visit.  Subjective Assessment - 11/27/17 1413    Subjective  Doing OK.  Neck is good.  Back is a lot better. Patient has not driven in 3 years due to her vision and her husband does not want her to drive.   i did some of the exercises.  i did not use the exercise log.     Patient is accompained by:  Family member    Currently in Pain?  No/denies    Pain Location  Back    Pain Orientation  Lower    Pain Descriptors / Indicators  --   stiff   Aggravating Factors   first thing this morning i was stiff,  not sure why.    Pain Relieving Factors  moving around                       [facility] Adult PT  Treatment/Exercise - 11/27/17 0001      High Level Balance   High Level Balance Comments  static and dynamic , on compliant and non compliant surface,  narrow and wide bases.  SBA for safety,  occasional CGA needed.  Patient continues to improve with balance.       Self-Care   Self-Care  Other Self-Care Comments    Other Self-Care Comments   Fall prevention.  discussed things she is doing  to prevent falls.  Uses flash light, rails,  wipes feet upon entering house ,  watches for scatter rugs,  uses her cane.       Lumbar Exercises: Aerobic   Nustep  6 minutes L5 UE/LE      Lumbar Exercises: Supine   Clam  15 reps    Clam Limitations  green band with abdominal bracing    Bent Knee Raise  10 reps    Dead Bug  10 reps    Dead Bug Limitations  cues initially  Bridge  15 reps      Knee/Hip Exercises: Standing   Hip Flexion  10 reps    Hip Flexion Limitations  SBA for safety    Hip Abduction  10 reps    Abduction Limitations  SBA alternating      Knee/Hip Exercises: Seated   Hamstring Curl  10 reps;Both    Hamstring Limitations  patient noted these were easier to do if she sits up straight    Sit to Sand  10 reps   brief pause with lowering 1/2 way            PT Education - 11/27/17 1630    Education Details  Fall prevention review , exercise form    Person(s) Educated  Patient    Methods  Explanation;Verbal cues    Comprehension  Verbalized understanding;Returned demonstration       PT Short Term Goals - 11/27/17 1640      PT SHORT TERM GOAL #1   Title  Pt and husband will report compliance with HEP    Baseline  Patient is doing some of the exercises ,  Husband confirms.    Time  4    Period  Weeks    Status  Partially Met      PT SHORT TERM GOAL #2   Title  Pt will demo proper use of SPC and verbalize comfort and safety during daily activities    Time  4    Period  Weeks    Status  Achieved      PT SHORT TERM GOAL #3   Title  pt will demo proper upright  posture with minimal cuing    Time  4    Period  Weeks    Status  Achieved        PT Long Term Goals - 11/27/17 1421      PT LONG TERM GOAL #1   Title  BERG score to improve by appropriate MDC    Time  8    Period  Weeks    Status  Achieved      PT LONG TERM GOAL #2   Title  Pt will demo safety on steps/stairs with minimal UE support    Time  8    Period  Weeks    Status  Achieved      PT LONG TERM GOAL #3   Title  Pt will be able to drive without limitation by cervical pain    Baseline  Has not driven in 3 years due to eyes/ husband.  Neck is not limiting.  Patient really wants to drive.     Time  8    Period  Weeks    Status  Deferred      PT LONG TERM GOAL #5   Title  Pt will be able to complete household chores with pain <=2/10 and verbalize proper safety precautions to avoid falls    Baseline  able to verbalize safety precautions to prevent falls with some prodding  needed for her to remenber things she is doing at home.  No pain with lifting laundry basket in clinic    Time  8    Status  Partially Met            Plan - 11/27/17 1633    Clinical Impression Statement  Patient and husband continue to be inconsistant with HEP, however they feel  they have been  able to be more active in community and with  walking.   HEP stressed as the main thing she can do to help  her maintain function and pain.  Deferred LTG# 3 today due to not being able to drive in 3 years.( vision issues mainly).  She was able to simulate lifting a basket of clothes in clinic without pain.  She plans to get a basket like ours  due to it being easier to handle.   No pain at end of session.     PT Next Visit Plan  , lifting ( simulate laundry) , dynamic balance, stairs-  Patient has 1 more visit then plans to be D/C.  Check goals,    PT Home Exercise Plan  scapular retraction, LTR, bridge, clam, standing horiz abd, supine GHJ flx stretch,  YOGA walking, sit<>stand without UE,  hamstring stretch,  standing horiz abd & diagonals    Consulted and Agree with Plan of Care  Patient    Family Member Consulted  husband       Patient will benefit from skilled therapeutic intervention in order to improve the following deficits and impairments:     Visit Diagnosis: Chronic bilateral low back pain without sciatica  Cervicalgia  Repeated falls     Problem List Patient Active Problem List   Diagnosis Date Noted  . Gait abnormality 08/30/2017  . Lumbar radiculopathy 08/30/2017  . Chronic left-sided low back pain with left-sided sciatica 07/21/2017  . Personal history of calcium pyrophosphate deposition disease (CPPD) 07/29/2016  . History of peripheral neuropathy 07/29/2016  . History of total knee replacement, right 07/22/2016  . Pseudogout 01/22/2016  . Primary osteoarthritis of both knees 01/22/2016  . Primary osteoarthritis of both hands 01/22/2016  . Primary osteoarthritis of both feet 01/22/2016  . Fibromyalgia 01/22/2016  . Primary insomnia 01/22/2016  . Obesity (BMI 30-39.9) 04/01/2014  . Obstructive sleep apnea 04/19/2013  . Essential hypertension, benign 04/19/2013  . Memory loss 08/02/2012    Henderson Frampton PTA 11/27/2017, 4:42 PM  [facility] [address] [place], [place], [address] Phone: [phone]   Fax:  [phone]  Name: Lynn Price MRN: [id number] Date of Birth: 1940-07-26

## 2017-11-29 ENCOUNTER — Ambulatory Visit: Payer: Medicare Other | Attending: Orthopaedic Surgery | Admitting: Physical Therapy

## 2017-11-29 ENCOUNTER — Encounter: Payer: Self-pay | Admitting: Physical Therapy

## 2017-11-29 DIAGNOSIS — R296 Repeated falls: Secondary | ICD-10-CM | POA: Diagnosis present

## 2017-11-29 DIAGNOSIS — M542 Cervicalgia: Secondary | ICD-10-CM | POA: Diagnosis present

## 2017-11-29 DIAGNOSIS — G8929 Other chronic pain: Secondary | ICD-10-CM | POA: Insufficient documentation

## 2017-11-29 DIAGNOSIS — M545 Low back pain, unspecified: Secondary | ICD-10-CM

## 2017-11-29 NOTE — Therapy (Signed)
[place], [place], [address] Phone: [phone]   Fax:  [phone]  Physical Therapy Treatment/Discharge Summary  Patient Details  Name: Lynn Price MRN: [id number] Date of Birth: 1940/10/11 Referring Provider (PT): Garald Balding, MD   Encounter Date: 11/29/2017  PT End of Session - 11/29/17 1415    Visit Number  15    Number of Visits  17    Date for PT Re-Evaluation  12/01/17    Authorization Type  [employer] MCR    PN at visit 10, KX at visit 15    PT Start Time  1415    PT Stop Time  1445    PT Time Calculation (min)  30 min    Activity Tolerance  Patient tolerated treatment well    Behavior During Therapy  [facility] for tasks assessed/performed       Past Medical History:  Diagnosis Date  . Anxiety   . Fibromyalgia   . GERD (gastroesophageal reflux disease)   . High blood pressure   . HSV (herpes simplex virus) anogenital infection   . IBS (irritable bowel syndrome)   . Memory loss   . Neuropathy   . Obesity (BMI 30-39.9) 04/01/2014  . Osteoarthritis   . Overactive bladder   . Restless leg syndrome   . Sleep apnea     Past Surgical History:  Procedure Laterality Date  . APPENDECTOMY    . CATARACT EXTRACTION Left   . CESAREAN SECTION    . CHOLECYSTECTOMY    . colonscopy    . MULTIPLE TOOTH EXTRACTIONS    . ROTATOR CUFF REPAIR Left   . TOTAL KNEE ARTHROPLASTY      There were no vitals filed for this visit.  Subjective Assessment - 11/29/17 1415    Subjective  Doing ok, back was hurting a little this morning    Patient Stated Goals  2 steps at home with one railing, turn head side/side, bending, housework    Currently in Pain?  No/denies         [facility] PT Assessment - 11/29/17 0001      Assessment   Medical Diagnosis  LBP, cervicalgia    Referring Provider (PT)  Garald Balding, MD      Observation/Other Assessments   Focus on Therapeutic Outcomes (FOTO)   47% limited       Sensation   Additional Comments  tingling in feet comes/goes, notice it in bed when I first wake up a lot      AROM   Cervical - Right Side Bend  28    Cervical - Left Side Bend  28    Cervical - Right Rotation  72    Cervical - Left Rotation  66      Strength   Right Hip Flexion  5/5    Right Hip ABduction  5/5    Left Hip Flexion  5/5    Left Hip ABduction  5/5                   [facility] Adult PT Treatment/Exercise - 11/29/17 0001      Exercises   Exercises  Other Exercises    Other Exercises   see list in HEP under "Plan"               PT Short Term Goals - 11/27/17 1640      PT SHORT TERM GOAL #1   Title  Pt and husband  will report compliance with HEP    Baseline  Patient is doing some of the exercises ,  Husband confirms.    Time  4    Period  Weeks    Status  Partially Met      PT SHORT TERM GOAL #2   Title  Pt will demo proper use of SPC and verbalize comfort and safety during daily activities    Time  4    Period  Weeks    Status  Achieved      PT SHORT TERM GOAL #3   Title  pt will demo proper upright posture with minimal cuing    Time  4    Period  Weeks    Status  Achieved        PT Long Term Goals - 11/29/17 1418      PT LONG TERM GOAL #1   Title  BERG score to improve by appropriate MDC    Baseline  MDC 3.3    Status  Achieved      PT LONG TERM GOAL #2   Title  Pt will demo safety on steps/stairs with minimal UE support    Baseline  able to demo safety on steps 6" 4" with cane and no rail in clinic.     Status  Achieved      PT LONG TERM GOAL #3   Title  Pt will be able to drive without limitation by cervical pain    Baseline  Has not driven in 3 years due to eyes/ husband.  Neck is not limiting.  Patient really wants to drive.     Status  Deferred      PT LONG TERM GOAL #4   Title  Pt will be able to bend to lift objects such as laundry basket without limitation by back/neck pain    Baseline  reports basket feels  heavy and low back feels sore, I can see some improvement, I can do it but I don't like doing it because I know it is going to hurt    Status  Not Met      PT LONG TERM GOAL #5   Title  Pt will be able to complete household chores with pain <=2/10 and verbalize proper safety precautions to avoid falls    Baseline  able to verbalize safety precautions to prevent falls with some prodding  needed for her to remenber things she is doing at home.  No pain with lifting laundry basket in clinic    Status  Partially Met            Plan - 11/29/17 1447    Clinical Impression Statement  Pt has made significant improvement since beginning PT and is now d/c to independent HEP. She and her husband are moving to a senior community with multiple activities and fitness opportunities and I encouraged her to become involved. Encouraged pt and her husband to contact us with any questions.     PT Treatment/Interventions  ADLs/Self Care Home Management;Cryotherapy;Electrical Stimulation;Moist Heat;Traction;Therapeutic exercise;Therapeutic activities;Functional mobility training;Stair training;Gait training;Balance training;Neuromuscular re-education;Patient/family education;Manual techniques;Taping;Dry needling;Passive range of motion    PT Home Exercise Plan  scapular retraction, LTR, bridge, clam, standing horiz abd, supine GHJ flx stretch,  YOGA walking, sit<>stand without UE,  hamstring stretch, standing horiz abd & diagonals    Consulted and Agree with Plan of Care  Patient       Patient will benefit from skilled therapeutic intervention in order to improve  the following deficits and impairments:  Abnormal gait, Decreased endurance, Decreased activity tolerance, Decreased strength, Pain, Increased muscle spasms, Decreased balance, Decreased range of motion, Impaired flexibility, Postural dysfunction  Visit Diagnosis: Chronic bilateral low back pain without sciatica  Cervicalgia  Repeated  falls     Problem List Patient Active Problem List   Diagnosis Date Noted  . Gait abnormality 08/30/2017  . Lumbar radiculopathy 08/30/2017  . Chronic left-sided low back pain with left-sided sciatica 07/21/2017  . Personal history of calcium pyrophosphate deposition disease (CPPD) 07/29/2016  . History of peripheral neuropathy 07/29/2016  . History of total knee replacement, right 07/22/2016  . Pseudogout 01/22/2016  . Primary osteoarthritis of both knees 01/22/2016  . Primary osteoarthritis of both hands 01/22/2016  . Primary osteoarthritis of both feet 01/22/2016  . Fibromyalgia 01/22/2016  . Primary insomnia 01/22/2016  . Obesity (BMI 30-39.9) 04/01/2014  . Obstructive sleep apnea 04/19/2013  . Essential hypertension, benign 04/19/2013  . Memory loss 08/02/2012    PHYSICAL THERAPY DISCHARGE SUMMARY  Visits from Start of Care: 15  Current functional level related to goals / functional outcomes: See above   Remaining deficits: See above   Education / Equipment: Anatomy of condition, POC, HEP, exercise form/rationale  Plan: Patient agrees to discharge.  Patient goals were partially met. Patient is being discharged due to meeting the stated rehab goals.  ?????     Nycholas Rayner C. Daaron Dimarco PT, DPT 11/29/17 2:52 PM   [facility] [address] [place], [place], [address] Phone: [phone]   Fax:  [phone]  Name: Lynn Price MRN: [id number] Date of Birth: 1940-06-26

## 2017-12-06 ENCOUNTER — Telehealth: Payer: Self-pay | Admitting: *Deleted

## 2017-12-06 ENCOUNTER — Ambulatory Visit: Payer: Medicare Other | Admitting: Adult Health

## 2017-12-06 NOTE — Telephone Encounter (Signed)
Pt no showed for appointment today.

## 2017-12-07 ENCOUNTER — Encounter: Payer: Self-pay | Admitting: Adult Health

## 2017-12-16 ENCOUNTER — Observation Stay (HOSPITAL_COMMUNITY)
Admission: EM | Admit: 2017-12-16 | Discharge: 2017-12-17 | Disposition: A | Discharge status: Home / Self Care | Payer: Medicare Other | Attending: Internal Medicine | Admitting: Internal Medicine

## 2017-12-16 ENCOUNTER — Encounter (HOSPITAL_COMMUNITY): Payer: Self-pay

## 2017-12-16 ENCOUNTER — Emergency Department (HOSPITAL_COMMUNITY): Payer: Medicare Other

## 2017-12-16 ENCOUNTER — Other Ambulatory Visit: Payer: Self-pay

## 2017-12-16 DIAGNOSIS — R05 Cough: Secondary | ICD-10-CM | POA: Insufficient documentation

## 2017-12-16 DIAGNOSIS — E876 Hypokalemia: Secondary | ICD-10-CM | POA: Diagnosis not present

## 2017-12-16 DIAGNOSIS — F329 Major depressive disorder, single episode, unspecified: Secondary | ICD-10-CM | POA: Insufficient documentation

## 2017-12-16 DIAGNOSIS — G2581 Restless legs syndrome: Secondary | ICD-10-CM | POA: Diagnosis not present

## 2017-12-16 DIAGNOSIS — M1712 Unilateral primary osteoarthritis, left knee: Secondary | ICD-10-CM | POA: Insufficient documentation

## 2017-12-16 DIAGNOSIS — M19041 Primary osteoarthritis, right hand: Secondary | ICD-10-CM | POA: Insufficient documentation

## 2017-12-16 DIAGNOSIS — R197 Diarrhea, unspecified: Secondary | ICD-10-CM | POA: Diagnosis present

## 2017-12-16 DIAGNOSIS — E860 Dehydration: Secondary | ICD-10-CM

## 2017-12-16 DIAGNOSIS — K589 Irritable bowel syndrome without diarrhea: Secondary | ICD-10-CM | POA: Insufficient documentation

## 2017-12-16 DIAGNOSIS — M19042 Primary osteoarthritis, left hand: Secondary | ICD-10-CM | POA: Insufficient documentation

## 2017-12-16 DIAGNOSIS — G4733 Obstructive sleep apnea (adult) (pediatric): Secondary | ICD-10-CM | POA: Diagnosis not present

## 2017-12-16 DIAGNOSIS — Z882 Allergy status to sulfonamides status: Secondary | ICD-10-CM | POA: Diagnosis not present

## 2017-12-16 DIAGNOSIS — Z881 Allergy status to other antibiotic agents status: Secondary | ICD-10-CM | POA: Insufficient documentation

## 2017-12-16 DIAGNOSIS — M797 Fibromyalgia: Secondary | ICD-10-CM | POA: Diagnosis not present

## 2017-12-16 DIAGNOSIS — G629 Polyneuropathy, unspecified: Secondary | ICD-10-CM | POA: Diagnosis not present

## 2017-12-16 DIAGNOSIS — R112 Nausea with vomiting, unspecified: Principal | ICD-10-CM | POA: Diagnosis present

## 2017-12-16 DIAGNOSIS — Z880 Allergy status to penicillin: Secondary | ICD-10-CM | POA: Diagnosis not present

## 2017-12-16 DIAGNOSIS — Z79899 Other long term (current) drug therapy: Secondary | ICD-10-CM | POA: Diagnosis not present

## 2017-12-16 DIAGNOSIS — I10 Essential (primary) hypertension: Secondary | ICD-10-CM | POA: Diagnosis not present

## 2017-12-16 DIAGNOSIS — Z96651 Presence of right artificial knee joint: Secondary | ICD-10-CM | POA: Diagnosis not present

## 2017-12-16 DIAGNOSIS — F419 Anxiety disorder, unspecified: Secondary | ICD-10-CM | POA: Insufficient documentation

## 2017-12-16 DIAGNOSIS — H409 Unspecified glaucoma: Secondary | ICD-10-CM | POA: Insufficient documentation

## 2017-12-16 DIAGNOSIS — K219 Gastro-esophageal reflux disease without esophagitis: Secondary | ICD-10-CM | POA: Insufficient documentation

## 2017-12-16 LAB — COMPREHENSIVE METABOLIC PANEL
ALT: 18 U/L (ref 0–44)
AST: 31 U/L (ref 15–41)
Albumin: 4.1 g/dL (ref 3.5–5.0)
Alkaline Phosphatase: 82 U/L (ref 38–126)
Anion gap: 10 (ref 5–15)
BUN: 17 mg/dL (ref 8–23)
CO2: 24 mmol/L (ref 22–32)
Calcium: 10.1 mg/dL (ref 8.9–10.3)
Chloride: 105 mmol/L (ref 98–111)
Creatinine, Ser: 0.9 mg/dL (ref 0.44–1.00)
GFR calc Af Amer: >60 mL/min (ref >60)
GFR calc non Af Amer: 60 mL/min — ABNORMAL LOW (ref >60)
Glucose, Bld: 115 mg/dL — ABNORMAL HIGH (ref 70–99)
Potassium: 3.3 mmol/L — ABNORMAL LOW (ref 3.5–5.1)
Sodium: 139 mmol/L (ref 135–145)
Total Bilirubin: 0.8 mg/dL (ref 0.3–1.2)
Total Protein: 7.8 g/dL (ref 6.5–8.1)

## 2017-12-16 LAB — CBC
HCT: 42.4 % (ref 36.0–46.0)
Hemoglobin: 13.6 g/dL (ref 12.0–15.0)
MCH: 29.9 pg (ref 26.0–34.0)
MCHC: 32.1 g/dL (ref 30.0–36.0)
MCV: 93.2 fL (ref 80.0–100.0)
Platelets: 268 10*3/uL (ref 150–400)
RBC: 4.55 MIL/uL (ref 3.87–5.11)
RDW: 12.4 % (ref 11.5–15.5)
WBC: 10.6 10*3/uL — ABNORMAL HIGH (ref 4.0–10.5)
nRBC: 0 % (ref 0.0–0.2)

## 2017-12-16 LAB — URINALYSIS, ROUTINE W REFLEX MICROSCOPIC
Bilirubin Urine: NEGATIVE
Glucose, UA: NEGATIVE mg/dL
Hgb urine dipstick: NEGATIVE
Ketones, ur: 20 mg/dL — AB
Nitrite: NEGATIVE
Protein, ur: NEGATIVE mg/dL
Specific Gravity, Urine: 1.017 (ref 1.005–1.030)
pH: 7 (ref 5.0–8.0)

## 2017-12-16 LAB — CBG MONITORING, ED: Glucose-Capillary: 105 mg/dL — ABNORMAL HIGH (ref 70–99)

## 2017-12-16 LAB — TROPONIN I: Troponin I: <0.03 ng/mL (ref <0.03)

## 2017-12-16 LAB — LIPASE, BLOOD: Lipase: 40 U/L (ref 11–51)

## 2017-12-16 MED ORDER — SODIUM CHLORIDE 0.9 % IV SOLN
INTRAVENOUS | Status: DC
  Administered 2017-12-16: 19:00:00 via INTRAVENOUS

## 2017-12-16 MED ORDER — FAMOTIDINE 20 MG PO TABS
20.0000 mg | ORAL_TABLET | Freq: Every day | ORAL | Status: DC
  Administered 2017-12-17: 20 mg via ORAL
  Filled 2017-12-16: qty 1

## 2017-12-16 MED ORDER — VENLAFAXINE HCL ER 150 MG PO CP24
150.0000 mg | ORAL_CAPSULE | Freq: Every day | ORAL | Status: DC
  Administered 2017-12-17 (×2): 150 mg via ORAL
  Filled 2017-12-16 (×2): qty 1
  Filled 2017-12-16: qty 2

## 2017-12-16 MED ORDER — DILTIAZEM HCL ER COATED BEADS 120 MG PO CP24
120.0000 mg | ORAL_CAPSULE | Freq: Every day | ORAL | Status: DC
  Administered 2017-12-17 (×2): 120 mg via ORAL
  Filled 2017-12-16 (×2): qty 1

## 2017-12-16 MED ORDER — GABAPENTIN 300 MG PO CAPS
600.0000 mg | ORAL_CAPSULE | Freq: Two times a day (BID) | ORAL | Status: DC
  Administered 2017-12-17 (×2): 600 mg via ORAL
  Filled 2017-12-16 (×2): qty 2

## 2017-12-16 MED ORDER — ACYCLOVIR 400 MG PO TABS
400.0000 mg | ORAL_TABLET | Freq: Every day | ORAL | Status: DC
  Administered 2017-12-17 (×4): 400 mg via ORAL
  Filled 2017-12-16 (×6): qty 1

## 2017-12-16 MED ORDER — METOCLOPRAMIDE HCL 5 MG/ML IJ SOLN
10.0000 mg | Freq: Once | INTRAMUSCULAR | Status: AC
  Administered 2017-12-16: 10 mg via INTRAVENOUS
  Filled 2017-12-16: qty 2

## 2017-12-16 MED ORDER — LORATADINE 10 MG PO TABS
10.0000 mg | ORAL_TABLET | Freq: Every day | ORAL | Status: DC
  Administered 2017-12-17: 10 mg via ORAL
  Filled 2017-12-16: qty 1

## 2017-12-16 MED ORDER — PROMETHAZINE HCL 25 MG/ML IJ SOLN: 6.2500 mg | Freq: Four times a day (QID) | INTRAMUSCULAR | Status: DC | PRN

## 2017-12-16 MED ORDER — FLUTICASONE PROPIONATE 50 MCG/ACT NA SUSP
2.0000 | Freq: Every day | NASAL | Status: DC
  Filled 2017-12-16: qty 16

## 2017-12-16 MED ORDER — DORZOLAMIDE HCL-TIMOLOL MAL 2-0.5 % OP SOLN
2.0000 [drp] | Freq: Every day | OPHTHALMIC | Status: DC
  Filled 2017-12-16: qty 10

## 2017-12-16 MED ORDER — SODIUM CHLORIDE 0.9 % IV BOLUS
1000.0000 mL | Freq: Once | INTRAVENOUS | Status: AC
  Administered 2017-12-16: 1000 mL via INTRAVENOUS

## 2017-12-16 MED ORDER — ACETAMINOPHEN 650 MG RE SUPP: 650.0000 mg | Freq: Four times a day (QID) | RECTAL | Status: DC | PRN

## 2017-12-16 MED ORDER — PANTOPRAZOLE SODIUM 40 MG PO TBEC
40.0000 mg | DELAYED_RELEASE_TABLET | Freq: Every day | ORAL | Status: DC
  Administered 2017-12-17: 40 mg via ORAL
  Filled 2017-12-16: qty 1

## 2017-12-16 MED ORDER — ONDANSETRON HCL 4 MG/2ML IJ SOLN
4.0000 mg | Freq: Once | INTRAMUSCULAR | Status: AC
  Administered 2017-12-16: 4 mg via INTRAVENOUS
  Filled 2017-12-16: qty 2

## 2017-12-16 MED ORDER — ONDANSETRON HCL 4 MG/2ML IJ SOLN: 4.0000 mg | Freq: Four times a day (QID) | INTRAMUSCULAR | Status: DC | PRN

## 2017-12-16 MED ORDER — QUETIAPINE FUMARATE 25 MG PO TABS
25.0000 mg | ORAL_TABLET | Freq: Two times a day (BID) | ORAL | Status: DC
  Administered 2017-12-17 (×2): 25 mg via ORAL
  Filled 2017-12-16 (×3): qty 1

## 2017-12-16 MED ORDER — LORAZEPAM 1 MG PO TABS: 1.0000 mg | ORAL_TABLET | Freq: Two times a day (BID) | ORAL | Status: DC | PRN

## 2017-12-16 MED ORDER — ACETAMINOPHEN 325 MG PO TABS: 650.0000 mg | ORAL_TABLET | Freq: Four times a day (QID) | ORAL | Status: DC | PRN

## 2017-12-16 MED ORDER — POTASSIUM CHLORIDE IN NACL 20-0.9 MEQ/L-% IV SOLN
INTRAVENOUS | Status: AC
  Administered 2017-12-17: via INTRAVENOUS
  Filled 2017-12-16: qty 1000

## 2017-12-16 MED ORDER — ENOXAPARIN SODIUM 40 MG/0.4ML ~~LOC~~ SOLN
40.0000 mg | SUBCUTANEOUS | Status: DC
  Administered 2017-12-17: 40 mg via SUBCUTANEOUS
  Filled 2017-12-16: qty 0.4

## 2017-12-16 NOTE — ED Provider Notes (Signed)
[facility] EMERGENCY DEPARTMENT Provider Note   CSN: [id number] Arrival date & time: 12/16/17  1225     History   Chief Complaint Chief Complaint  Patient presents with  . Nausea  . Weakness    HPI RHYANN BERTON is a 77 y.o. female.  The history is provided by the patient and the spouse.  Diarrhea   This is a new problem. Episode onset: 3 days ago. The problem occurs 5 to 10 times per day. The problem has not changed since onset.The stool consistency is described as watery. There has been no fever. Associated symptoms include abdominal pain, vomiting, chills and cough. Associated symptoms comments: Chills.  Patient states she has had more than 10 episodes of diarrhea for the last 3 days as well as intermittent nausea and vomiting.  She was able to hold down to crackers yesterday but that was it.  She is feeling generally weak but denies any shortness of breath or chest pain.  She has had a dry cough and some runny nose but denies sore throat.. She has tried nothing for the symptoms. The treatment provided no relief. Risk factors: no recent travel, sick contacts or bad food.  unsure if she has had abx in the last 2 months. Past medical history comments: s/p appy and choley.    Past Medical History:  Diagnosis Date  . Anxiety   . Fibromyalgia   . GERD (gastroesophageal reflux disease)   . High blood pressure   . HSV (herpes simplex virus) anogenital infection   . IBS (irritable bowel syndrome)   . Memory loss   . Neuropathy   . Obesity (BMI 30-39.9) 04/01/2014  . Osteoarthritis   . Overactive bladder   . Restless leg syndrome   . Sleep apnea     Patient Active Problem List   Diagnosis Date Noted  . Gait abnormality 08/30/2017  . Lumbar radiculopathy 08/30/2017  . Chronic left-sided low back pain with left-sided sciatica 07/21/2017  . Personal history of calcium pyrophosphate deposition disease (CPPD) 07/29/2016  . History of peripheral neuropathy  07/29/2016  . History of total knee replacement, right 07/22/2016  . Pseudogout 01/22/2016  . Primary osteoarthritis of both knees 01/22/2016  . Primary osteoarthritis of both hands 01/22/2016  . Primary osteoarthritis of both feet 01/22/2016  . Fibromyalgia 01/22/2016  . Primary insomnia 01/22/2016  . Obesity (BMI 30-39.9) 04/01/2014  . Obstructive sleep apnea 04/19/2013  . Essential hypertension, benign 04/19/2013  . Memory loss 08/02/2012    Past Surgical History:  Procedure Laterality Date  . APPENDECTOMY    . CATARACT EXTRACTION Left   . CESAREAN SECTION    . CHOLECYSTECTOMY    . colonscopy    . MULTIPLE TOOTH EXTRACTIONS    . ROTATOR CUFF REPAIR Left   . TOTAL KNEE ARTHROPLASTY       OB History   None      Home Medications    Prior to Admission medications   Medication Sig Start Date End Date Taking? Authorizing Provider  acetaminophen (TYLENOL) 500 MG tablet Take 325 mg by mouth 2 (two) times daily as needed for mild pain.     [provider]  acyclovir (ZOVIRAX) 400 MG tablet Take 400 mg by mouth daily. 11/16/17   [provider]  cephALEXin (KEFLEX) 500 MG capsule Take 500 mg by mouth 3 (three) times daily. 11/10/17   [provider]  cetirizine (ZYRTEC) 10 MG tablet Take 10 mg by mouth daily.  [provider]  cholecalciferol (VITAMIN D) 1000 units tablet Take 1,000 Units by mouth daily.    [provider]  diltiazem (CARDIZEM CD) 120 MG 24 hr capsule Take 120 mg by mouth daily.    [provider]  dorzolamide-timolol (COSOPT) 22.3-6.8 MG/ML ophthalmic solution Place 2 drops into the right eye at bedtime.  08/15/17   [provider]  fluticasone (FLONASE) 50 MCG/ACT nasal spray Place 2 sprays into both nostrils daily.     [provider]  gabapentin (NEURONTIN) 300 MG capsule TAKE 2 CAPSULES BY MOUTH  TWO TIMES DAILY Patient taking differently: Take 600 mg by mouth 2 (two) times daily.   11/14/17   Gearldine Bienenstock, PA-C  LORazepam (ATIVAN) 1 MG tablet Take 1 mg by mouth 2 (two) times daily as needed for anxiety.    [provider]  mometasone (ELOCON) 0.1 % cream Apply 1 application topically daily.  03/27/17   [provider]  Omega-3 Fatty Acids (FISH OIL) 1200 MG CAPS Take 1,200 mg by mouth daily.     [provider]  Omeprazole (HM OMEPRAZOLE) 20 MG TBEC Take 20 mg by mouth 2 (two) times daily.     [provider]  ondansetron (ZOFRAN) 4 MG tablet Take 4 mg by mouth every 8 (eight) hours as needed.  05/22/17   [provider]  QUEtiapine (SEROQUEL) 25 MG tablet Take 25 mg by mouth 2 (two) times daily.  05/22/17   [provider]  ranitidine (ZANTAC) 150 MG capsule Take 150 mg by mouth every evening.     [provider]  valACYclovir (VALTREX) 500 MG tablet Take 500 mg by mouth 2 (two) times daily.  05/16/13   [provider]  venlafaxine XR (EFFEXOR XR) 150 MG 24 hr capsule Take 150 mg by mouth daily.    [provider]    Family History Family History  Problem Relation Age of Onset  . Depression Mother   . Depression Father   . Mental illness Sister   . Mental illness Sister     Social History Social History   Tobacco Use  . Smoking status: Never Smoker  . Smokeless tobacco: Never Used  Substance Use Topics  . Alcohol use: Yes    Comment: wine occas  . Drug use: No     Allergies   Erythromycin; Penicillins; Sulfa antibiotics; and Tetracyclines & related   Review of Systems Review of Systems  Constitutional: Positive for chills.  Respiratory: Positive for cough.   Gastrointestinal: Positive for abdominal pain, diarrhea and vomiting.  All other systems reviewed and are negative.    Physical Exam Updated Vital Signs BP (!) 162/77 (BP Location: Right Arm)   Pulse 75   Temp 97.7 F (36.5 C) (Oral)   Resp 14   Ht 5\' 5"  (1.651 m)   Wt 59.4 kg   SpO2 100%   BMI 21.79 kg/m    Physical Exam  Constitutional: She is oriented to person, place, and time. She appears well-developed and well-nourished. No distress.  HENT:  Head: Normocephalic and atraumatic.  Mouth/Throat: Oropharynx is clear and moist. Mucous membranes are dry.  Eyes: Pupils are equal, round, and reactive to light. Conjunctivae and EOM are normal.  Neck: Normal range of motion. Neck supple.  Cardiovascular: Normal rate, regular rhythm and intact distal pulses.  No murmur heard. Pulmonary/Chest: Effort normal and breath sounds normal. No respiratory distress. She has no wheezes. She has no rales.  Abdominal: Soft.  She exhibits no distension. There is tenderness in the periumbilical area, suprapubic area and left lower quadrant. There is no rebound, no guarding and no CVA tenderness.  Musculoskeletal: Normal range of motion. She exhibits no edema or tenderness.  Neurological: She is alert and oriented to person, place, and time. She has normal strength. No sensory deficit.  Skin: Skin is warm and dry. No rash noted. No erythema.  Psychiatric: She has a normal mood and affect. Her behavior is normal.  Nursing note and vitals reviewed.    ED Treatments / Results  Labs (all labs ordered are listed, but only abnormal results are displayed) Labs Reviewed  CBC - Abnormal; Notable for the following components:      Result Value   WBC 10.6 (*)    All other components within normal limits  COMPREHENSIVE METABOLIC PANEL - Abnormal; Notable for the following components:   Potassium 3.3 (*)    Glucose, Bld 115 (*)    GFR calc non Af Amer 60 (*)    All other components within normal limits  CBG MONITORING, ED - Abnormal; Notable for the following components:   Glucose-Capillary 105 (*)    All other components within normal limits  LIPASE, BLOOD  URINALYSIS, ROUTINE W REFLEX MICROSCOPIC    EKG EKG Interpretation  Date/Time:  Saturday December 16 2017 12:33:36 EDT Ventricular Rate:  77 PR  Interval:    QRS Duration: 108 QT Interval:  401 QTC Calculation: 454 R Axis:   -53 Text Interpretation:  Sinus rhythm LAD, consider left anterior fascicular block Low voltage, precordial leads RSR' in V1 or V2, right VCD or RVH No significant change since last tracing Confirmed by Gwyneth Sprout ([id number]) on 12/16/2017 12:50:39 PM   Radiology No results found.  Procedures Procedures (including critical care time)  Medications Ordered in ED Medications  ondansetron (ZOFRAN) injection 4 mg (has no administration in time range)  sodium chloride 0.9 % bolus 1,000 mL (has no administration in time range)  0.9 %  sodium chloride infusion (has no administration in time range)     Initial Impression / Assessment and Plan / ED Course  I have reviewed the triage vital signs and the nursing notes.  Pertinent labs & imaging results that were available during my care of the patient were reviewed by me and considered in my medical decision making (see chart for details).    Patient is a 77 year old female with a history of hypertension, GERD and anxiety who is presenting today with 3 days of nausea vomiting and diarrhea.  She has mild diffuse abdominal tenderness.  She denies any urinary symptoms but is unsure if she has had any antibiotics recently.  Patient symptoms may be viral in nature versus bacterial diarrhea.  Patient does appear dehydrated.  Vital signs are reassuring.  Patient was afebrile.  Will do labs to ensure no hypokalemia, acute kidney injury and will also get a chest x-ray as patient has had a cough and was seen not too long ago after a choking episode to ensure she did not develop pneumonia.  Patient given IV fluids and Zofran.  7:06 PM Patient's labs are consistent with mild leukocytosis of 10,000, normal lipase and CMP with normal LFTs.  Potassium mildly decreased at 3.3.  After fluids patient stated she was feeling better however we attempted to p.o. challenge and after a few  sips of ginger ale she started feeling sick again with epigastric discomfort.  On repeat exam patient did not have any  lower abdominal pain.  Low suspicion for diverticulitis at this time.  Patient is continually feeling ill and unable to hold down anything we will admit for ongoing supportive care.  Patient has not had any diarrhea here to test for C. difficile or other bacterial or viral infections.   Final Clinical Impressions(s) / ED Diagnoses   Final diagnoses:  Dehydration  Hypokalemia  Nausea vomiting and diarrhea    ED Discharge Orders    None       Gwyneth Sprout, MD 12/16/17 [place] Moment

## 2017-12-16 NOTE — ED Notes (Signed)
ED Provider at bedside. 

## 2017-12-16 NOTE — ED Notes (Signed)
Pt given water to drink , tolerating well 

## 2017-12-16 NOTE — H&P (Signed)
[facility] H&P   Patient Demographics:    Lynn Price, is a 77 y.o. female  MRN: [id number]   DOB - 31-Mar-1940  Admit Date - 12/16/2017  Outpatient Primary MD for the patient is Blair Heys, MD  Referring MD/NP/PA:  Gwyneth Sprout  Outpatient Specialists:     Patient coming from:   home  Chief Complaint  Patient presents with  . Nausea  . Weakness      HPI:    Lynn Price  is a 77 y.o. female, w h/o hypertension, anxiety, irritable bowel apparently c/o nausea. Intractable over the past 3 day associated with diarrhea 5+ times per day.  "loose stool" associated with some lower abdominal cramping when she has the diarrhea.  Pt denies fever, chills, cough, cp, palp, sob, emesis, constipation, brbpr, black stool, dysuria, hematuria.  Pt feels unable to go home per ED.  Pt notes that prior colonoscopy negative   In ED,  T 97.7  P 75  Bp 161/85  Pox 100% on RA    CXR IMPRESSION: No active cardiopulmonary disease.  Wbc 10.6  Hgb 13.6, Plt 268 Na 139, K 3.3, Bun 27, Creatinine 0.90   Ast 31, Alt 18 K 3.3  Pt will be admitted for w/up of diarrhea, and also intractable nausea and hypokalemia.      Review of systems:    In addition to the HPI above,  No Fever-chills, No Headache, No changes with Vision or hearing, No problems swallowing food or Liquids, No Chest pain, Cough or Shortness of Breath,  No dysuria, No new skin rashes or bruises, No new joints pains-aches,  No new weakness, tingling, numbness in any extremity, No recent weight gain or loss, No polyuria, polydypsia or polyphagia, No significant Mental Stressors.  A full 10 point Review of Systems was done, except as stated above, all other Review of Systems were negative.   With Past History of the following :    Past Medical History:  Diagnosis Date  . Anxiety   . Fibromyalgia   . GERD  (gastroesophageal reflux disease)   . High blood pressure   . HSV (herpes simplex virus) anogenital infection   . IBS (irritable bowel syndrome)   . Memory loss   . Neuropathy   . Obesity (BMI 30-39.9) 04/01/2014  . Osteoarthritis   . Overactive bladder   . Restless leg syndrome   . Sleep apnea       Past Surgical History:  Procedure Laterality Date  . APPENDECTOMY    . CATARACT EXTRACTION Left   . CESAREAN SECTION    . CHOLECYSTECTOMY    . colonscopy    . MULTIPLE TOOTH EXTRACTIONS    . ROTATOR CUFF REPAIR Left   . TOTAL KNEE ARTHROPLASTY        Social History:     Social History   Tobacco Use  . Smoking status:  Never Smoker  . Smokeless tobacco: Never Used  Substance Use Topics  . Alcohol use: Yes    Comment: wine occas     Lives - at home with husband,   Mobility - walks by self   Family History :     Family History  Problem Relation Age of Onset  . Depression Mother   . Depression Father   . Mental illness Sister   . Mental illness Sister        Home Medications:   Prior to Admission medications   Medication Sig Start Date End Date Taking? Authorizing Provider  acetaminophen (TYLENOL) 500 MG tablet Take 325 mg by mouth 2 (two) times daily as needed for mild pain.    Yes [provider]  acyclovir (ZOVIRAX) 400 MG tablet Take 400 mg by mouth 5 (five) times daily.   Yes [provider]  cetirizine (ZYRTEC) 10 MG tablet Take 10 mg by mouth daily.   Yes [provider]  cholecalciferol (VITAMIN D) 1000 units tablet Take 1,000 Units by mouth daily.   Yes [provider]  diltiazem (CARDIZEM CD) 120 MG 24 hr capsule Take 120 mg by mouth daily.   Yes [provider]  dorzolamide-timolol (COSOPT) 22.3-6.8 MG/ML ophthalmic solution Place 2 drops into the right eye at bedtime.  08/15/17  Yes [provider]  fluticasone (FLONASE) 50 MCG/ACT nasal spray Place 2 sprays into both nostrils daily.    Yes  [provider]  gabapentin (NEURONTIN) 300 MG capsule TAKE 2 CAPSULES BY MOUTH  TWO TIMES DAILY Patient taking differently: Take 600 mg by mouth 2 (two) times daily.  11/14/17  Yes Gearldine Bienenstock, PA-C  LORazepam (ATIVAN) 1 MG tablet Take 1 mg by mouth 2 (two) times daily as needed for anxiety.   Yes [provider]  mometasone (ELOCON) 0.1 % cream Apply 1 application topically daily.  03/27/17  Yes [provider]  Omega-3 Fatty Acids (FISH OIL) 1200 MG CAPS Take 1,200 mg by mouth daily.    Yes [provider]  Omeprazole (HM OMEPRAZOLE) 20 MG TBEC Take 20 mg by mouth 2 (two) times daily as needed (for heartburn).    Yes [provider]  QUEtiapine (SEROQUEL) 25 MG tablet Take 25 mg by mouth 2 (two) times daily.  05/22/17  Yes [provider]  ranitidine (ZANTAC) 150 MG capsule Take 150 mg by mouth every evening.    Yes [provider]  venlafaxine XR (EFFEXOR XR) 150 MG 24 hr capsule Take 150 mg by mouth daily.   Yes [provider]     Allergies:     Allergies  Allergen Reactions  . Erythromycin Rash  . Penicillins Rash    Has patient had a PCN reaction causing immediate rash, facial/tongue/throat swelling, SOB or lightheadedness with hypotension: No Has patient had a PCN reaction causing severe rash involving mucus membranes or skin necrosis: No Has patient had a PCN reaction that required hospitalization: No Has patient had a PCN reaction occurring within the last 10 years: No If all of the above answers are "NO", then may proceed with Cephalosporin use.  . Sulfa Antibiotics Rash  . Tetracyclines & Related Rash     Physical Exam:   Vitals  Blood pressure (!) 161/85, pulse 75, temperature 97.7 F (36.5 C), temperature source Oral, resp. rate 15, height 5\' 5"  (1.651 m), weight 59.4 kg, SpO2 100 %.   1. General  lying in bed in NAD,  2. Normal affect and insight, Not Suicidal or Homicidal, Awake Alert,  Oriented X 3.  3. No F.N deficits, ALL C.Nerves Intact, Strength 5/5 all 4 extremities, Sensation intact all 4 extremities, Plantars down going.  4. Ears and Eyes appear Normal, Conjunctivae clear, PERRLA. Moist Oral Mucosa.  5. Supple Neck, No JVD, No cervical lymphadenopathy appriciated, No Carotid Bruits.  6. Symmetrical Chest wall movement, Good air movement bilaterally, CTAB.  7. RRR, No Gallops, Rubs or Murmurs, No Parasternal Heave.  8. Positive Bowel Sounds, Abdomen Soft, No tenderness, No organomegaly appriciated,No rebound -guarding or rigidity.  9.  No Cyanosis, Normal Skin Turgor, No Skin Rash or Bruise.  10. Good muscle tone,  joints appear normal , no effusions, Normal ROM.  11. No Palpable Lymph Nodes in Neck or Axillae     Data Review:    CBC Recent Labs  Lab 12/16/17 1246  WBC 10.6*  HGB 13.6  HCT 42.4  PLT 268  MCV 93.2  MCH 29.9  MCHC 32.1  RDW 12.4   ------------------------------------------------------------------------------------------------------------------  Chemistries  Recent Labs  Lab 12/16/17 1246  NA 139  K 3.3*  CL 105  CO2 24  GLUCOSE 115*  BUN 17  CREATININE 0.90  CALCIUM 10.1  AST 31  ALT 18  ALKPHOS 82  BILITOT 0.8   ------------------------------------------------------------------------------------------------------------------ estimated creatinine clearance is 46.4 mL/min (by C-G formula based on SCr of 0.9 mg/dL). ------------------------------------------------------------------------------------------------------------------ No results for input(s): TSH, T4TOTAL, T3FREE, THYROIDAB in the last 72 hours.  Invalid input(s): FREET3  Coagulation profile No results for input(s): INR, PROTIME in the last 168 hours. ------------------------------------------------------------------------------------------------------------------- No results for input(s): DDIMER in the last 72  hours. -------------------------------------------------------------------------------------------------------------------  Cardiac Enzymes No results for input(s): CKMB, TROPONINI, MYOGLOBIN in the last 168 hours.  Invalid input(s): CK ------------------------------------------------------------------------------------------------------------------ No results found for: BNP   ---------------------------------------------------------------------------------------------------------------  Urinalysis    Component Value Date/Time   COLORURINE YELLOW 03/27/2015 0216   APPEARANCEUR CLEAR 03/27/2015 0216   LABSPEC 1.003 (L) 03/27/2015 0216   PHURINE 6.0 03/27/2015 0216   GLUCOSEU NEGATIVE 03/27/2015 0216   HGBUR NEGATIVE 03/27/2015 0216   BILIRUBINUR NEGATIVE 03/27/2015 0216   KETONESUR NEGATIVE 03/27/2015 0216   PROTEINUR NEGATIVE 03/27/2015 0216   UROBILINOGEN 0.2 11/12/2009 1119   NITRITE POSITIVE (A) 03/27/2015 0216   LEUKOCYTESUR TRACE (A) 03/27/2015 0216    ----------------------------------------------------------------------------------------------------------------   Imaging Results:    Dg Chest 2 View  Result Date: 12/16/2017 CLINICAL DATA:  Nausea, vomiting and diarrhea with cough 2 days. EXAM: CHEST - 2 VIEW COMPARISON:  11/12/2009 and chest CT 12/16/2009 FINDINGS: Lungs are adequately inflated without focal airspace consolidation or effusion. Cardiomediastinal silhouette is within normal. There is mild degenerative change of the spine. IMPRESSION: No active cardiopulmonary disease. Electronically Signed   By: Elberta Fortis M.D.   On: 12/16/2017 13:42       Assessment & Plan:    Principal Problem:   Nausea & vomiting Active Problems:   Diarrhea   Hypokalemia    Nausea Diarrhea Stool studies Clear liquids zofran 4mg  iv q6h prn  Phenergan 6.25mg  iv q6h prn If tolerating clears can advance diet Check cbc in am  Hypokalemia Replete Check cmp in  am  Gerd Cont PPI Cont H2 blocker  Hypertension Cont Cardizem CD 120mg  po qday  Anxiety Cont Effexor Cont Lorazepam prn Cont Seroquel (note this was started in the last 2 weeks)  RLS Cont Neurontin  Glaucoma Cont Dorzolamide    DVT Prophylaxis    Lovenox -  SCDs   AM Labs Ordered, also please review Full Orders  Family Communication: Admission, patients condition and plan of care including tests being ordered have been discussed with the patient  who indicate understanding and agree with the plan and Code Status.  Code Status FULL CODE  Likely DC to  home  Condition GUARDED    Consults called:   none  Admission status: observation  Time spent in minutes : 55   Pearson Grippe M.D on 12/16/2017 at 7:41 PM  Between 7am to 7pm - Pager - [phone]  . After 7pm go to [email or web address] - password [facility]  [facility] - Office  [phone]

## 2017-12-16 NOTE — ED Notes (Signed)
Pt started to feel nauseated again, emesis bag given to pt

## 2017-12-16 NOTE — ED Triage Notes (Signed)
Pt from home with ems for c.o N/V/D for the past 2 days with generalized fatigue. Pt alert upon arrival, nad. Denies sick contacts.

## 2017-12-17 DIAGNOSIS — R112 Nausea with vomiting, unspecified: Secondary | ICD-10-CM | POA: Diagnosis not present

## 2017-12-17 DIAGNOSIS — E876 Hypokalemia: Secondary | ICD-10-CM | POA: Diagnosis not present

## 2017-12-17 DIAGNOSIS — R197 Diarrhea, unspecified: Secondary | ICD-10-CM | POA: Diagnosis not present

## 2017-12-17 LAB — BASIC METABOLIC PANEL
Anion gap: 6 (ref 5–15)
BUN: 12 mg/dL (ref 8–23)
CO2: 20 mmol/L — ABNORMAL LOW (ref 22–32)
Calcium: 8.8 mg/dL — ABNORMAL LOW (ref 8.9–10.3)
Chloride: 111 mmol/L (ref 98–111)
Creatinine, Ser: 0.75 mg/dL (ref 0.44–1.00)
GFR calc Af Amer: >60 mL/min (ref >60)
GFR calc non Af Amer: >60 mL/min (ref >60)
Glucose, Bld: 91 mg/dL (ref 70–99)
Potassium: 3.5 mmol/L (ref 3.5–5.1)
Sodium: 137 mmol/L (ref 135–145)

## 2017-12-17 LAB — COMPREHENSIVE METABOLIC PANEL WITH GFR
ALT: 16 U/L (ref 0–44)
AST: 40 U/L (ref 15–41)
Albumin: 3.4 g/dL — ABNORMAL LOW (ref 3.5–5.0)
Alkaline Phosphatase: 64 U/L (ref 38–126)
Anion gap: 9 (ref 5–15)
BUN: 14 mg/dL (ref 8–23)
CO2: 20 mmol/L — ABNORMAL LOW (ref 22–32)
Calcium: 8.6 mg/dL — ABNORMAL LOW (ref 8.9–10.3)
Chloride: 107 mmol/L (ref 98–111)
Creatinine, Ser: 0.73 mg/dL (ref 0.44–1.00)
GFR calc Af Amer: >60 mL/min
GFR calc non Af Amer: >60 mL/min
Glucose, Bld: 110 mg/dL — ABNORMAL HIGH (ref 70–99)
Potassium: 3.4 mmol/L — ABNORMAL LOW (ref 3.5–5.1)
Sodium: 136 mmol/L (ref 135–145)
Total Bilirubin: 0.8 mg/dL (ref 0.3–1.2)
Total Protein: 6.4 g/dL — ABNORMAL LOW (ref 6.5–8.1)

## 2017-12-17 LAB — CBC
HCT: 38 % (ref 36.0–46.0)
Hemoglobin: 12.3 g/dL (ref 12.0–15.0)
MCH: 30.3 pg (ref 26.0–34.0)
MCHC: 32.4 g/dL (ref 30.0–36.0)
MCV: 93.6 fL (ref 80.0–100.0)
Platelets: 200 K/uL (ref 150–400)
RBC: 4.06 MIL/uL (ref 3.87–5.11)
RDW: 12.6 % (ref 11.5–15.5)
WBC: 12.3 K/uL — ABNORMAL HIGH (ref 4.0–10.5)
nRBC: 0 % (ref 0.0–0.2)

## 2017-12-17 LAB — C DIFFICILE QUICK SCREEN W PCR REFLEX
C Diff antigen: NEGATIVE
C Diff interpretation: NOT DETECTED
C Diff toxin: NEGATIVE

## 2017-12-17 LAB — GASTROINTESTINAL PANEL BY PCR, STOOL (REPLACES STOOL CULTURE)

## 2017-12-17 MED ORDER — ORAL CARE MOUTH RINSE: 15.0000 mL | Freq: Two times a day (BID) | OROMUCOSAL | Status: DC

## 2017-12-17 MED ORDER — POTASSIUM CHLORIDE CRYS ER 20 MEQ PO TBCR
40.0000 meq | EXTENDED_RELEASE_TABLET | Freq: Once | ORAL | Status: AC
  Administered 2017-12-17: 40 meq via ORAL
  Filled 2017-12-17: qty 2

## 2017-12-17 MED ORDER — CHLORHEXIDINE GLUCONATE 0.12 % MT SOLN
15.0000 mL | Freq: Two times a day (BID) | OROMUCOSAL | Status: DC
  Administered 2017-12-17: 15 mL via OROMUCOSAL
  Filled 2017-12-17: qty 15

## 2017-12-17 NOTE — Discharge Instructions (Signed)
Diet for Irritable Bowel Syndrome When you have irritable bowel syndrome (IBS), the foods you eat and your eating habits are very important. IBS may cause various symptoms, such as abdominal pain, constipation, or diarrhea. Choosing the right foods can help ease discomfort caused by these symptoms. Work with your health care provider and dietitian to find the best eating plan to help control your symptoms. What general guidelines do I need to follow?  Keep a food diary. This will help you identify foods that cause symptoms. Write down: ? What you eat and when. ? What symptoms you have. ? When symptoms occur in relation to your meals.  Avoid foods that cause symptoms. Talk with your dietitian about other ways to get the same nutrients that are in these foods.  Eat more foods that contain fiber. Take a fiber supplement if directed by your dietitian.  Eat your meals slowly, in a relaxed setting.  Aim to eat 5-6 small meals per day. Do not skip meals.  Drink enough fluids to keep your urine clear or pale yellow.  Ask your health care provider if you should take an over-the-counter probiotic during flare-ups to help restore healthy gut bacteria.  If you have cramping or diarrhea, try making your meals low in fat and high in carbohydrates. Examples of carbohydrates are pasta, rice, whole grain breads and cereals, fruits, and vegetables.  If dairy products cause your symptoms to flare up, try eating less of them. You might be able to handle yogurt better than other dairy products because it contains bacteria that help with digestion. What foods are not recommended? The following are some foods and drinks that may worsen your symptoms:  Fatty foods, such as French fries.  Milk products, such as cheese or ice cream.  Chocolate.  Alcohol.  Products with caffeine, such as coffee.  Carbonated drinks, such as soda.  The items listed above may not be a complete list of foods and beverages to  avoid. Contact your dietitian for more information. What foods are good sources of fiber? Your health care provider or dietitian may recommend that you eat more foods that contain fiber. Fiber can help reduce constipation and other IBS symptoms. Add foods with fiber to your diet a little at a time so that your body can get used to them. Too much fiber at once might cause gas and swelling of your abdomen. The following are some foods that are good sources of fiber:  Apples.  Peaches.  Pears.  Berries.  Figs.  Broccoli (raw).  Cabbage.  Carrots.  Raw peas.  Kidney beans.  Lima beans.  Whole grain bread.  Whole grain cereal.  Where to find more information: International Foundation for Functional Gastrointestinal Disorders: www.iffgd.org National Institute of Diabetes and Digestive and Kidney Diseases: www.niddk.nih.gov/health-information/health-topics/digestive-diseases/ibs/Pages/facts.aspx This information is not intended to replace advice given to you by your health care provider. Make sure you discuss any questions you have with your health care provider. Document Released: 05/07/2003 Document Revised: 07/23/2015 Document Reviewed: 05/17/2013 Elsevier Interactive Patient Education  2018 [employer]  

## 2017-12-17 NOTE — Plan of Care (Signed)

## 2017-12-17 NOTE — Discharge Summary (Signed)
Physician Discharge Summary  Lynn Price WUJ:[id number] DOB: [birth year] DOA: 12/16/2017  PCP: Blair Heys, MD  Admit date: 12/16/2017 Discharge date: 12/17/2017  Time spent: 45 minutes  Recommendations for Outpatient Follow-up:  Patient will be discharged to home.  Patient will need to follow up with primary care provider within one week of discharge, repeat BMP.  Patient should continue medications as prescribed.  Patient should follow a soft diet.   Discharge Diagnoses:  Nausea, diarrhea, suspect gastroenteritis versus IBS Hypokalemia GERD Hypertension Anxiety Restless leg syndrome Glaucoma  Discharge Condition: stable  Diet recommendation: soft   Filed Weights   12/16/17 1232 12/17/17 0806  Weight: 59.4 kg 65.7 kg    History of present illness:  On 12/16/2017 by Dr. Dianne Dun Pacer  is a 77 y.o. female, w h/o hypertension, anxiety, irritable bowel apparently c/o nausea. Intractable over the past 3 day associated with diarrhea 5+ times per day.  "loose stool" associated with some lower abdominal cramping when she has the diarrhea.  Pt denies fever, chills, cough, cp, palp, sob, emesis, constipation, brbpr, black stool, dysuria, hematuria.  Pt feels unable to go home per ED.  Pt notes that prior colonoscopy negative   Hospital Course:  Nausea, diarrhea, suspect gastroenteritis versus IBS -Patient has a history of IBS however has not seen a gastroenterologist but has had a colonoscopy in the past which was negative -Not had any further episodes of diarrhea since admission.  She currently denies any further nausea. -Placed on soft diet and tolerated well -C. difficile PCR negative -GI pathogen panel pending but can be followed by her PCP -Had long discussion with patient on things to avoid with IBS and strongly recommended she follow-up with a gastroenterologist  Hypokalemia -Replaced -Repeat BMP in 1 week  GERD -Continue PPI, H2 blocker (although  this could also add to her diarrhea)  Hypertension -Continue Cardizem  Anxiety/Depression -Continue Effexor, Lorazepam, Seroquel  Restless leg syndrome -Continue Neurontin  Glaucoma -Continue dorzolamide  Procedures: None  Consultations: None  Discharge Exam: Vitals:   12/16/17 2154 12/17/17 0451  BP: (!) 160/84 113/72  Pulse: 84 75  Resp: 20 18  Temp: 97.6 F (36.4 C) 98.5 F (36.9 C)  SpO2: 98% 98%   Patient no longer has abdominal pain, nausea or vomiting, diarrhea.  Denies current chest pain, shortness of breath.  Does feel that she can go home.   General: Well developed, well nourished, NAD, appears stated age  HEENT: NCAT, mucous membranes moist.  Neck: Supple  Cardiovascular: S1 S2 auscultated, RRR  Respiratory: Clear to auscultation bilaterally with equal chest rise  Abdomen: Soft, nontender, nondistended, + bowel sounds  Extremities: warm dry without cyanosis clubbing or edema  Neuro: AAOx3, nonfocal  Psych: Anxious, however appropriate  Discharge Instructions Discharge Instructions    Discharge instructions   Complete by:  As directed    Patient will be discharged to home.  Patient will need to follow up with primary care provider within one week of discharge, repeat BMP.  Patient should continue medications as prescribed.  Patient should follow a soft diet.     Allergies as of 12/17/2017      Reactions   Erythromycin Rash   Penicillins Rash   Has patient had a PCN reaction causing immediate rash, facial/tongue/throat swelling, SOB or lightheadedness with hypotension: No Has patient had a PCN reaction causing severe rash involving mucus membranes or skin necrosis: No Has patient had a PCN reaction that required hospitalization: No Has patient  had a PCN reaction occurring within the last 10 years: No If all of the above answers are "NO", then may proceed with Cephalosporin use.   Sulfa Antibiotics Rash   Tetracyclines & Related Rash        Medication List    TAKE these medications   acyclovir 400 MG tablet Commonly known as:  ZOVIRAX Take 400 mg by mouth 5 (five) times daily.   cetirizine 10 MG tablet Commonly known as:  ZYRTEC Take 10 mg by mouth daily.   cholecalciferol 1000 units tablet Commonly known as:  VITAMIN D Take 1,000 Units by mouth daily.   diltiazem 120 MG 24 hr capsule Commonly known as:  CARDIZEM CD Take 120 mg by mouth daily.   dorzolamide-timolol 22.3-6.8 MG/ML ophthalmic solution Commonly known as:  COSOPT Place 2 drops into the right eye at bedtime.   EFFEXOR XR 150 MG 24 hr capsule Generic drug:  venlafaxine XR Take 150 mg by mouth daily.   Fish Oil 1200 MG Caps Take 1,200 mg by mouth daily.   fluticasone 50 MCG/ACT nasal spray Commonly known as:  FLONASE Place 2 sprays into both nostrils daily.   gabapentin 300 MG capsule Commonly known as:  NEURONTIN TAKE 2 CAPSULES BY MOUTH  TWO TIMES DAILY What changed:  See the new instructions.   HM OMEPRAZOLE 20 MG Tbec Generic drug:  Omeprazole Take 20 mg by mouth 2 (two) times daily as needed (for heartburn).   LORazepam 1 MG tablet Commonly known as:  ATIVAN Take 1 mg by mouth 2 (two) times daily as needed for anxiety.   mometasone 0.1 % cream Commonly known as:  ELOCON Apply 1 application topically daily.   QUEtiapine 25 MG tablet Commonly known as:  SEROQUEL Take 25 mg by mouth 2 (two) times daily.   ranitidine 150 MG capsule Commonly known as:  ZANTAC Take 150 mg by mouth every evening.   TYLENOL 500 MG tablet Generic drug:  acetaminophen Take 325 mg by mouth 2 (two) times daily as needed for mild pain.      Allergies  Allergen Reactions  . Erythromycin Rash  . Penicillins Rash    Has patient had a PCN reaction causing immediate rash, facial/tongue/throat swelling, SOB or lightheadedness with hypotension: No Has patient had a PCN reaction causing severe rash involving mucus membranes or skin necrosis: No Has  patient had a PCN reaction that required hospitalization: No Has patient had a PCN reaction occurring within the last 10 years: No If all of the above answers are "NO", then may proceed with Cephalosporin use.  . Sulfa Antibiotics Rash  . Tetracyclines & Related Rash   Follow-up Information    Blair Heys, MD. Schedule an appointment as soon as possible for a visit in 1 week(s).   Specialty:  Family Medicine Why:  Hospital follow up Contact information: [address], [address] [place] [address] [phone]            The results of significant diagnostics from this hospitalization (including imaging, microbiology, ancillary and laboratory) are listed below for reference.    Significant Diagnostic Studies: Dg Chest 2 View  Result Date: 12/16/2017 CLINICAL DATA:  Nausea, vomiting and diarrhea with cough 2 days. EXAM: CHEST - 2 VIEW COMPARISON:  11/12/2009 and chest CT 12/16/2009 FINDINGS: Lungs are adequately inflated without focal airspace consolidation or effusion. Cardiomediastinal silhouette is within normal. There is mild degenerative change of the spine. IMPRESSION: No active cardiopulmonary disease. Electronically Signed  By: Elberta Fortis M.D.   On: 12/16/2017 13:42    Microbiology: Recent Results (from the past 240 hour(s))  C difficile quick scan w PCR reflex     Status: None   Collection Time: 12/16/17 11:14 PM  Result Value Ref Range Status   C Diff antigen NEGATIVE NEGATIVE Final   C Diff toxin NEGATIVE NEGATIVE Final   C Diff interpretation No C. difficile detected.  Final    Comment: Performed at [facility], [address] [place], [place] [address]     Labs: Basic Metabolic Panel: Recent Labs  Lab 12/16/17 1246 12/17/17 0240 12/17/17 1156  NA 139 136 137  K 3.3* 3.4* 3.5  CL 105 107 111  CO2 24 20* 20*  GLUCOSE 115* 110* 91  BUN 17 14 12   CREATININE 0.90 0.73 0.75  CALCIUM 10.1 8.6* 8.8*   Liver Function Tests: Recent  Labs  Lab 12/16/17 1246 12/17/17 0240  AST 31 40  ALT 18 16  ALKPHOS 82 64  BILITOT 0.8 0.8  PROT 7.8 6.4*  ALBUMIN 4.1 3.4*   Recent Labs  Lab 12/16/17 1246  LIPASE 40   No results for input(s): AMMONIA in the last 168 hours. CBC: Recent Labs  Lab 12/16/17 1246 12/17/17 0240  WBC 10.6* 12.3*  HGB 13.6 12.3  HCT 42.4 38.0  MCV 93.2 93.6  PLT 268 200   Cardiac Enzymes: Recent Labs  Lab 12/16/17 2152  TROPONINI <0.03   BNP: BNP (last 3 results) No results for input(s): BNP in the last 8760 hours.  ProBNP (last 3 results) No results for input(s): PROBNP in the last 8760 hours.  CBG: Recent Labs  Lab 12/16/17 1243  GLUCAP 105*       Signed:  Cailah Reach  [facility] 12/17/2017, 1:16 PM

## 2017-12-21 ENCOUNTER — Other Ambulatory Visit: Payer: Self-pay | Admitting: Family Medicine

## 2017-12-21 DIAGNOSIS — Z1231 Encounter for screening mammogram for malignant neoplasm of breast: Secondary | ICD-10-CM

## 2017-12-26 ENCOUNTER — Ambulatory Visit: Payer: Medicare Other

## 2017-12-27 ENCOUNTER — Ambulatory Visit
Admission: RE | Admit: 2017-12-27 | Discharge: 2017-12-27 | Disposition: A | Discharge status: Home / Self Care | Payer: Medicare Other | Source: Ambulatory Visit | Attending: Family Medicine | Admitting: Family Medicine

## 2017-12-27 DIAGNOSIS — Z1231 Encounter for screening mammogram for malignant neoplasm of breast: Secondary | ICD-10-CM

## 2017-12-29 ENCOUNTER — Ambulatory Visit: Payer: Medicare Other | Admitting: Adult Health

## 2018-01-01 ENCOUNTER — Encounter: Payer: Self-pay | Admitting: Adult Health

## 2018-01-19 ENCOUNTER — Other Ambulatory Visit: Payer: Self-pay | Admitting: Medical

## 2018-01-19 NOTE — Progress Notes (Signed)
Office Visit Note  [name]             Date of Birth: 09-22-1940           MRN: [id number]             PCP: Blair Heys, MD Referring: Blair Heys, MD Visit Date: 02/02/2018 Occupation: @GUAROCC @  Subjective:  Pain in multiple joints.   History of Present Illness: Lynn Price is a 77 y.o. female with history of pseudogout, osteoarthritis and fibromyalgia.  She states she was recently in the hospital after choking on a piece of banana.  She also had an episode which lasted for 2 days where she had generalized pain and resolved.  She is not having much discomfort in her joints currently.  She denies any flare of pseudogout.  None of the joints are swollen.  Her insomnia is better.  Activities of Daily Living:  Patient reports morning stiffness for 5 minute.   Patient Denies nocturnal pain.  Difficulty dressing/grooming: Reports Difficulty climbing stairs: Reports Difficulty getting out of chair: Reports Difficulty using hands for taps, buttons, cutlery, and/or writing: Denies  Review of Systems  Constitutional: Positive for fatigue. Negative for night sweats, weight gain and weight loss.  HENT: Positive for mouth dryness. Negative for mouth sores, trouble swallowing, trouble swallowing and nose dryness.   Eyes: Negative for pain, redness, visual disturbance and dryness.  Respiratory: Negative for cough, shortness of breath and difficulty breathing.   Cardiovascular: Negative for chest pain, palpitations, hypertension, irregular heartbeat and swelling in legs/feet.  Gastrointestinal: Positive for constipation. Negative for blood in stool and diarrhea.  Endocrine: Negative for increased urination.  Genitourinary: Negative for vaginal dryness.  Musculoskeletal: Positive for arthralgias, joint pain, myalgias, morning stiffness and myalgias. Negative for joint swelling, muscle weakness and muscle tenderness.  Skin: Negative for color change, rash, hair loss,  skin tightness, ulcers and sensitivity to sunlight.  Allergic/Immunologic: Negative for susceptible to infections.  Neurological: Positive for memory loss. Negative for dizziness, night sweats and weakness.  Hematological: Negative for swollen glands.  Psychiatric/Behavioral: Negative for depressed mood and sleep disturbance. The patient is not nervous/anxious.     PMFS History:  Patient Active Problem List   Diagnosis Date Noted  . Nausea & vomiting 12/16/2017  . Diarrhea 12/16/2017  . Hypokalemia 12/16/2017  . Gait abnormality 08/30/2017  . Lumbar radiculopathy 08/30/2017  . Chronic left-sided low back pain with left-sided sciatica 07/21/2017  . Personal history of calcium pyrophosphate deposition disease (CPPD) 07/29/2016  . History of peripheral neuropathy 07/29/2016  . History of total knee replacement, right 07/22/2016  . Pseudogout 01/22/2016  . Primary osteoarthritis of both knees 01/22/2016  . Primary osteoarthritis of both hands 01/22/2016  . Primary osteoarthritis of both feet 01/22/2016  . Fibromyalgia 01/22/2016  . Primary insomnia 01/22/2016  . Obesity (BMI 30-39.9) 04/01/2014  . Obstructive sleep apnea 04/19/2013  . Essential hypertension, benign 04/19/2013  . Memory loss 08/02/2012    Past Medical History:  Diagnosis Date  . Anxiety   . Fibromyalgia   . GERD (gastroesophageal reflux disease)   . High blood pressure   . HSV (herpes simplex virus) anogenital infection   . IBS (irritable bowel syndrome)   . Memory loss   . Neuropathy   . Obesity (BMI 30-39.9) 04/01/2014  . Osteoarthritis   . Overactive bladder   . Restless leg syndrome   . Sleep apnea     Family History  Problem Relation Age of  Onset  . Depression Mother   . Depression Father   . Mental illness Sister   . Mental illness Sister    Past Surgical History:  Procedure Laterality Date  . APPENDECTOMY    . CATARACT EXTRACTION Left   . CESAREAN SECTION    . CHOLECYSTECTOMY    . colonscopy     . MULTIPLE TOOTH EXTRACTIONS    . ROTATOR CUFF REPAIR Left   . TOTAL KNEE ARTHROPLASTY     Social History   Social History Narrative   [name] and she is retired. Patient has 2 years of college. Patient has 2 children.     Objective: Vital Signs: BP 128/77 (BP Location: Left Arm, Patient Position: Sitting, Cuff Size: Normal)   Pulse 78   Resp 14   Ht 5\' 5"  (1.651 m)   Wt 140 lb 9.6 oz (63.8 kg)   BMI 23.40 kg/m    Physical Exam  Constitutional: She is oriented to person, place, and time. She appears well-developed and well-nourished.  HENT:  Head: Normocephalic and atraumatic.  Eyes: Conjunctivae and EOM are normal.  Neck: Normal range of motion.  Cardiovascular: Normal rate, regular rhythm, normal heart sounds and intact distal pulses.  Pulmonary/Chest: Effort normal and breath sounds normal.  Abdominal: Soft. Bowel sounds are normal.  Lymphadenopathy:    She has no cervical adenopathy.  Neurological: She is alert and oriented to person, place, and time.  Skin: Skin is warm and dry. Capillary refill takes less than 2 seconds.  Psychiatric: She has a normal mood and affect. Her behavior is normal.  Nursing note and vitals reviewed.    Musculoskeletal Exam: C-spine, thoracic and lumbar spine good range of motion.  Shoulder joints, elbow joints, wrist joints, MCPs and PIPs been good range of motion without synovitis.  Hip joints, knee joints, ankles MTPs were in good range of motion with no synovitis.  She has left total knee replacement which is doing well.  She has some generalized hyperalgesia.  CDAI Exam: CDAI Score: Not documented Patient Global Assessment: Not documented; Provider Global Assessment: Not documented Swollen: Not documented; Tender: Not documented Joint Exam   Not documented   There is currently no information documented on the homunculus. Go to the Rheumatology activity and complete the homunculus joint  exam.  Investigation: No additional findings.  Imaging: No results found.  Recent Labs: Lab Results  Component Value Date   WBC 12.3 (H) 12/17/2017   HGB 12.3 12/17/2017   PLT 200 12/17/2017   NA 137 12/17/2017   K 3.5 12/17/2017   CL 111 12/17/2017   CO2 20 (L) 12/17/2017   GLUCOSE 91 12/17/2017   BUN 12 12/17/2017   CREATININE 0.75 12/17/2017   BILITOT 0.8 12/17/2017   ALKPHOS 64 12/17/2017   AST 40 12/17/2017   ALT 16 12/17/2017   PROT 6.4 (L) 12/17/2017   ALBUMIN 3.4 (L) 12/17/2017   CALCIUM 8.8 (L) 12/17/2017   GFRAA >60 12/17/2017    Speciality Comments: No specialty comments available.  Procedures:  No procedures performed Allergies: Erythromycin; Penicillins; Sulfa antibiotics; and Tetracyclines & related   Assessment / Plan:     Visit Diagnoses: Pseudogout-patient had no synovitis on examination.  Patient had no synovitis on examination.  Primary osteoarthritis of both hands-she has DIP and PIP thickening.  Joint protection muscle strengthening was discussed.  Primary osteoarthritis of right knee-she has minimal discomfort in the right knee joint with range of motion.  History of total knee replacement, left-doing well.  Primary osteoarthritis of both feet-proper fitting shoes were discussed.  Fibromyalgia-she has some generalized pain but is manageable.  Primary insomnia-better with medications.  History of memory loss  History of sleep apnea  History of anxiety  History of seborrheic dermatitis  History of peripheral neuropathy-patient is on Neurontin.  She was on a lower dose but she recently increase it to 600 twice daily.  We had detailed discussion regarding decreasing dose to 300 mg in the morning and 600 mg in the evening.  If she tolerates that she can decrease it to 300 twice daily.  Vitamin D deficiency -use of calcium and vitamin D was discussed.  Her last bone density in 2017 was normal.  Orders: No orders of the defined types  were placed in this encounter.  No orders of the defined types were placed in this encounter.     Follow-Up Instructions: Return in about 6 months (around 08/04/2018) for Osteoarthritis, FMS, CPPD.   Pollyann Savoy, MD  Note - This record has been created using Animal nutritionist.  Chart creation errors have been sought, but may not always  have been located. Such creation errors do not reflect on  the standard of medical care.

## 2018-02-02 ENCOUNTER — Ambulatory Visit: Payer: Medicare Other | Admitting: Rheumatology

## 2018-02-02 ENCOUNTER — Encounter: Payer: Self-pay | Admitting: Rheumatology

## 2018-02-02 VITALS — BP 128/77 | HR 78 | Resp 14 | Ht 65.0 in | Wt 140.6 lb

## 2018-02-02 DIAGNOSIS — F5101 Primary insomnia: Secondary | ICD-10-CM

## 2018-02-02 DIAGNOSIS — Z8659 Personal history of other mental and behavioral disorders: Secondary | ICD-10-CM

## 2018-02-02 DIAGNOSIS — M19071 Primary osteoarthritis, right ankle and foot: Secondary | ICD-10-CM

## 2018-02-02 DIAGNOSIS — M1120 Other chondrocalcinosis, unspecified site: Secondary | ICD-10-CM

## 2018-02-02 DIAGNOSIS — E559 Vitamin D deficiency, unspecified: Secondary | ICD-10-CM

## 2018-02-02 DIAGNOSIS — Z8669 Personal history of other diseases of the nervous system and sense organs: Secondary | ICD-10-CM

## 2018-02-02 DIAGNOSIS — M19041 Primary osteoarthritis, right hand: Secondary | ICD-10-CM | POA: Diagnosis not present

## 2018-02-02 DIAGNOSIS — Z96652 Presence of left artificial knee joint: Secondary | ICD-10-CM

## 2018-02-02 DIAGNOSIS — Z872 Personal history of diseases of the skin and subcutaneous tissue: Secondary | ICD-10-CM

## 2018-02-02 DIAGNOSIS — M1711 Unilateral primary osteoarthritis, right knee: Secondary | ICD-10-CM

## 2018-02-02 DIAGNOSIS — M19042 Primary osteoarthritis, left hand: Secondary | ICD-10-CM

## 2018-02-02 DIAGNOSIS — Z87898 Personal history of other specified conditions: Secondary | ICD-10-CM

## 2018-02-02 DIAGNOSIS — M797 Fibromyalgia: Secondary | ICD-10-CM

## 2018-02-02 DIAGNOSIS — M19072 Primary osteoarthritis, left ankle and foot: Secondary | ICD-10-CM

## 2018-05-17 ENCOUNTER — Telehealth (INDEPENDENT_AMBULATORY_CARE_PROVIDER_SITE_OTHER): Payer: Self-pay | Admitting: Physical Medicine and Rehabilitation

## 2018-05-17 NOTE — Telephone Encounter (Signed)
Yes, as we are scheduling now

## 2018-05-18 NOTE — Telephone Encounter (Signed)
Patient does not want to schedule an injection at this time.

## 2018-07-24 NOTE — Progress Notes (Deleted)
Office Visit Note  [name]             Date of Birth: December 03, 1940           MRN: [id number]             PCP: Blair Heys, MD Referring: Blair Heys, MD Visit Date: 08/06/2018 Occupation: @GUAROCC @  Subjective:  No chief complaint on file.   History of Present Illness: Lynn Price is a 78 y.o. female ***   Activities of Daily Living:  Patient reports morning stiffness for *** {minute/hour:19697}.   Patient {ACTIONS;DENIES/REPORTS:21021675::"Denies"} nocturnal pain.  Difficulty dressing/grooming: {ACTIONS;DENIES/REPORTS:21021675::"Denies"} Difficulty climbing stairs: {ACTIONS;DENIES/REPORTS:21021675::"Denies"} Difficulty getting out of chair: {ACTIONS;DENIES/REPORTS:21021675::"Denies"} Difficulty using hands for taps, buttons, cutlery, and/or writing: {ACTIONS;DENIES/REPORTS:21021675::"Denies"}  No Rheumatology ROS completed.   PMFS History:  Patient Active Problem List   Diagnosis Date Noted  . Nausea & vomiting 12/16/2017  . Diarrhea 12/16/2017  . Hypokalemia 12/16/2017  . Gait abnormality 08/30/2017  . Lumbar radiculopathy 08/30/2017  . Chronic left-sided low back pain with left-sided sciatica 07/21/2017  . Personal history of calcium pyrophosphate deposition disease (CPPD) 07/29/2016  . History of peripheral neuropathy 07/29/2016  . History of total knee replacement, right 07/22/2016  . Pseudogout 01/22/2016  . Primary osteoarthritis of both knees 01/22/2016  . Primary osteoarthritis of both hands 01/22/2016  . Primary osteoarthritis of both feet 01/22/2016  . Fibromyalgia 01/22/2016  . Primary insomnia 01/22/2016  . Obesity (BMI 30-39.9) 04/01/2014  . Obstructive sleep apnea 04/19/2013  . Essential hypertension, benign 04/19/2013  . Memory loss 08/02/2012    Past Medical History:  Diagnosis Date  . Anxiety   . Fibromyalgia   . GERD (gastroesophageal reflux disease)   . High blood pressure   . HSV (herpes simplex virus)  anogenital infection   . IBS (irritable bowel syndrome)   . Memory loss   . Neuropathy   . Obesity (BMI 30-39.9) 04/01/2014  . Osteoarthritis   . Overactive bladder   . Restless leg syndrome   . Sleep apnea     Family History  Problem Relation Age of Onset  . Depression Mother   . Depression Father   . Mental illness Sister   . Mental illness Sister    Past Surgical History:  Procedure Laterality Date  . APPENDECTOMY    . CATARACT EXTRACTION Left   . CESAREAN SECTION    . CHOLECYSTECTOMY    . colonscopy    . MULTIPLE TOOTH EXTRACTIONS    . ROTATOR CUFF REPAIR Left   . TOTAL KNEE ARTHROPLASTY     Social History   Social History Narrative   Patient lives at home with her husband Dimas Aguas) and she is retired. Patient has 2 years of college. Patient has 2 children.     There is no immunization history on file for this patient.   Objective: Vital Signs: There were no vitals taken for this visit.   Physical Exam   Musculoskeletal Exam: ***  CDAI Exam: CDAI Score: Not documented Patient Global Assessment: Not documented; Provider Global Assessment: Not documented Swollen: Not documented; Tender: Not documented Joint Exam   Not documented   There is currently no information documented on the homunculus. Go to the Rheumatology activity and complete the homunculus joint exam.  Investigation: No additional findings.  Imaging: No results found.  Recent Labs: Lab Results  Component Value Date   WBC 12.3 (H) 12/17/2017   HGB 12.3 12/17/2017   PLT 200 12/17/2017   NA  137 12/17/2017   K 3.5 12/17/2017   CL 111 12/17/2017   CO2 20 (L) 12/17/2017   GLUCOSE 91 12/17/2017   BUN 12 12/17/2017   CREATININE 0.75 12/17/2017   BILITOT 0.8 12/17/2017   ALKPHOS 64 12/17/2017   AST 40 12/17/2017   ALT 16 12/17/2017   PROT 6.4 (L) 12/17/2017   ALBUMIN 3.4 (L) 12/17/2017   CALCIUM 8.8 (L) 12/17/2017   GFRAA >60 12/17/2017    Speciality Comments: No specialty comments  available.  Procedures:  No procedures performed Allergies: Erythromycin; Penicillins; Sulfa antibiotics; and Tetracyclines & related   Assessment / Plan:     Visit Diagnoses: No diagnosis found.   Orders: No orders of the defined types were placed in this encounter.  No orders of the defined types were placed in this encounter.   Face-to-face time spent with patient was *** minutes. Greater than 50% of time was spent in counseling and coordination of care.  Follow-Up Instructions: No follow-ups on file.   Lynn Price, CMA  Note - This record has been created using Animal nutritionistDragon software.  Chart creation errors have been sought, but may not always  have been located. Such creation errors do not reflect on  the standard of medical care.

## 2018-07-25 ENCOUNTER — Other Ambulatory Visit (INDEPENDENT_AMBULATORY_CARE_PROVIDER_SITE_OTHER): Payer: Self-pay | Admitting: Physician Assistant

## 2018-07-25 NOTE — Telephone Encounter (Signed)
Last Visit: 02/02/18 Next Visit: 08/06/18  Okay to refill per Dr. Corliss Skains

## 2018-08-06 ENCOUNTER — Ambulatory Visit: Payer: Self-pay | Admitting: Rheumatology

## 2018-08-13 NOTE — Progress Notes (Deleted)
Office Visit Note  [name]             Date of Birth: 05-27-1940           MRN: [id number]             PCP: Blair HeysEhinger, Robert, MD Referring: Blair HeysEhinger, Robert, MD Visit Date: 08/27/2018 Occupation: @GUAROCC @  Subjective:  No chief complaint on file.  Repeat DXA? Bone density showed T score of -0.4 in May 2017.   History of Present Illness: Lynn Fosterlaine C Bagg is a 78 y.o. female ***   Activities of Daily Living:  Patient reports morning stiffness for *** {minute/hour:19697}.   Patient {ACTIONS;DENIES/REPORTS:21021675::"Denies"} nocturnal pain.  Difficulty dressing/grooming: {ACTIONS;DENIES/REPORTS:21021675::"Denies"} Difficulty climbing stairs: {ACTIONS;DENIES/REPORTS:21021675::"Denies"} Difficulty getting out of chair: {ACTIONS;DENIES/REPORTS:21021675::"Denies"} Difficulty using hands for taps, buttons, cutlery, and/or writing: {ACTIONS;DENIES/REPORTS:21021675::"Denies"}  No Rheumatology ROS completed.   PMFS History:  Patient Active Problem List   Diagnosis Date Noted  . Nausea & vomiting 12/16/2017  . Diarrhea 12/16/2017  . Hypokalemia 12/16/2017  . Gait abnormality 08/30/2017  . Lumbar radiculopathy 08/30/2017  . Chronic left-sided low back pain with left-sided sciatica 07/21/2017  . Personal history of calcium pyrophosphate deposition disease (CPPD) 07/29/2016  . History of peripheral neuropathy 07/29/2016  . History of total knee replacement, right 07/22/2016  . Pseudogout 01/22/2016  . Primary osteoarthritis of both knees 01/22/2016  . Primary osteoarthritis of both hands 01/22/2016  . Primary osteoarthritis of both feet 01/22/2016  . Fibromyalgia 01/22/2016  . Primary insomnia 01/22/2016  . Obesity (BMI 30-39.9) 04/01/2014  . Obstructive sleep apnea 04/19/2013  . Essential hypertension, benign 04/19/2013  . Memory loss 08/02/2012    Past Medical History:  Diagnosis Date  . Anxiety   . Fibromyalgia   . GERD (gastroesophageal reflux disease)    . High blood pressure   . HSV (herpes simplex virus) anogenital infection   . IBS (irritable bowel syndrome)   . Memory loss   . Neuropathy   . Obesity (BMI 30-39.9) 04/01/2014  . Osteoarthritis   . Overactive bladder   . Restless leg syndrome   . Sleep apnea     Family History  Problem Relation Age of Onset  . Depression Mother   . Depression Father   . Mental illness Sister   . Mental illness Sister    Past Surgical History:  Procedure Laterality Date  . APPENDECTOMY    . CATARACT EXTRACTION Left   . CESAREAN SECTION    . CHOLECYSTECTOMY    . colonscopy    . MULTIPLE TOOTH EXTRACTIONS    . ROTATOR CUFF REPAIR Left   . TOTAL KNEE ARTHROPLASTY     Social History   Social History Narrative   [name] and she is retired. Patient has 2 years of college. Patient has 2 children.     There is no immunization history on file for this patient.   Objective: Vital Signs: There were no vitals taken for this visit.   Physical Exam   Musculoskeletal Exam: ***  CDAI Exam: CDAI Score: - Patient Global: -; Provider Global: - Swollen: -; Tender: - Joint Exam   No joint exam has been documented for this visit   There is currently no information documented on the homunculus. Go to the Rheumatology activity and complete the homunculus joint exam.  Investigation: No additional findings.  Imaging: No results found.  Recent Labs: Lab Results  Component Value Date   WBC 12.3 (H) 12/17/2017  HGB 12.3 12/17/2017   PLT 200 12/17/2017   NA 137 12/17/2017   K 3.5 12/17/2017   CL 111 12/17/2017   CO2 20 (L) 12/17/2017   GLUCOSE 91 12/17/2017   BUN 12 12/17/2017   CREATININE 0.75 12/17/2017   BILITOT 0.8 12/17/2017   ALKPHOS 64 12/17/2017   AST 40 12/17/2017   ALT 16 12/17/2017   PROT 6.4 (L) 12/17/2017   ALBUMIN 3.4 (L) 12/17/2017   CALCIUM 8.8 (L) 12/17/2017   GFRAA >60 12/17/2017    Speciality Comments: No specialty  comments available.  Procedures:  No procedures performed Allergies: Erythromycin, Penicillins, Sulfa antibiotics, and Tetracyclines & related   Assessment / Plan:     Visit Diagnoses: No diagnosis found.   Orders: No orders of the defined types were placed in this encounter.  No orders of the defined types were placed in this encounter.   Face-to-face time spent with patient was *** minutes. Greater than 50% of time was spent in counseling and coordination of care.  Follow-Up Instructions: No follow-ups on file.   Ofilia Neas, PA-C  Note - This record has been created using Dragon software.  Chart creation errors have been sought, but may not always  have been located. Such creation errors do not reflect on  the standard of medical care.

## 2018-08-27 ENCOUNTER — Telehealth: Payer: Self-pay | Admitting: *Deleted

## 2018-08-27 ENCOUNTER — Ambulatory Visit: Payer: Medicare Other | Admitting: Physician Assistant

## 2018-08-27 NOTE — Telephone Encounter (Signed)
I called, pt was not able to make it for appt tomorrow and stated she called yesterday, Sunday to cancell this appt.  She stated her husband was ill and she needed to make appt next several weeks.  I made for 09-18-18 at 1245 with SS/NP (Dr Krista Blue NP).  Pt was ok with this.

## 2018-08-28 ENCOUNTER — Ambulatory Visit: Payer: Medicare Other | Admitting: Adult Health

## 2018-09-07 ENCOUNTER — Other Ambulatory Visit: Payer: Self-pay | Admitting: Neurology

## 2018-09-10 ENCOUNTER — Other Ambulatory Visit: Payer: Self-pay | Admitting: Neurology

## 2018-09-17 NOTE — Progress Notes (Signed)
[name] DOB: Dec 28, 1940  REASON FOR VISIT: follow up HISTORY FROM: patient  HISTORY OF PRESENT ILLNESS: Today 09/18/18  HISTORY  Lynn Price, 78 year old female was referred by her primary care physician Dr. Blair Heysobert Ehinger for evaluation of memory trouble. She was last seen in June 2015 by Eber Jonesarolyn for memory loss  She has a history of fibromyalgia hypertension, anxiety, obstructive sleep apnea using CPAP and short-term memory trouble. She also has a history of bilateral feet paresthesias. 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 She denies significant family history of dementia   MRI of the brain 07/11/2011 shows mild changes of chronic microvascular ischemia and generalized cerebral atrophy.   The patient continues to drive and has gotten lost in unfamiliar surroundings. She continues to be independent with her activities of daily living. She plays golf intermittently otherwise she gets no regular exercise. She has not had any falls, long-standing history of frequency incontinence and on oxybutynin. Her Mini-Mental Status exam is stable. She returns for reevaluation  UPDATE June 6th 2016. She is with her husband at today's clinical visit, she continue has mild slow worsening memory trouble, she continued to drive short distances in a familiar route, she visit her friends, go to church regularly, able to clean house, independent at daily activity,  She also complains of bilateral feet numbness tingling, slow worsening, she is taking gabapentin 300 mg 2 tablets in the morning 3 tablets every night, does make her feel tired and sleepy,  She has not used her CPAP machine for a while, tends to go to bed late, take long nap in the afternoon,  She also complains of being fatigued, lack of energy  UPDATE August 30 2017:  She is accompanied by her husband at today's clinical visit, she has been followed by our  office regularly for memory loss, is referred back by her primary care physician Dr. Beverley FiedlerVictoria  Rankins for evaluation of worsening low back pain, gait abnormality,  She had history of chronic low back pain, gradually getting worse, radiating pain to left lower extremity, bilateral leg numbness, weakness, left worse than right, has tripped and fell few times, is receiving epidural injection which has helped her low back pain, she also complains of occasional knee bowel and bladder incontinence, frequent urinary urgency.  Personally reviewed MRI of lumbar in June 23, 2017, there is evidence of multilevel degenerative changes, progression of right-sided disc protrusion L4-5 with expected impingement of right L5 nerve roots, diffuse endplate spurring, right greater than left L5-S1, are exchanged with subarticular foraminal stenosis, right greater than left  Update 09/18/2018 SS: Ms. Thomasene Price is a 78 year old female with history of memory disturbance and bilateral lumbar radiculopathy. She presents today for follow-up accompanied by her granddaughter.  She indicates her memory is fair.  She lives with her husband, she is able to perform her ADLs, manages cooking, finances with her husband.  She has a good appetite. She manages her medications in a weekly pillbox. Occasionally she may miss a dose of medicine.  She is able to drive short distances only.  She recently had eye surgery.  She does not have any family nearby, but her family rotates in and out to visit.  Unfortunately, her husband has developed memory trouble and his is more progressive.  She is having to care for him.  At times she reports she may hear a man on the porch, but knows he is not here.  She does follow with Dr. Durward Fortes for chronic back pain, she says she completed physical therapy earlier this year and it was beneficial.  She indicates that her chronic low back pain has improved, she reports some weakness in her legs.  She denies bowel or  bladder incontinence.  She has not had an epidural injection in a while, she uses max freeze on her back.  She is considering assisted living for her and her husband.   REVIEW OF SYSTEMS: Out of a complete 14 system review of symptoms, the patient complains only of the following symptoms, and all other reviewed systems are negative.  Chronic low back pain, memory loss  ALLERGIES: Allergies  Allergen Reactions   Erythromycin Rash   Penicillins Rash    Has patient had a PCN reaction causing immediate rash, facial/tongue/throat swelling, SOB or lightheadedness with hypotension: No Has patient had a PCN reaction causing severe rash involving mucus membranes or skin necrosis: No Has patient had a PCN reaction that required hospitalization: No Has patient had a PCN reaction occurring within the last 10 years: No If all of the above answers are "NO", then may proceed with Cephalosporin use.   Sulfa Antibiotics Rash   Tetracyclines & Related Rash    HOME MEDICATIONS: Outpatient Medications Prior to Visit  Medication Sig Dispense Refill   acetaminophen (TYLENOL) 500 MG tablet Take 325 mg by mouth 2 (two) times daily as needed for mild pain.      acyclovir (ZOVIRAX) 400 MG tablet Take 400 mg by mouth 5 (five) times daily.     cetirizine (ZYRTEC) 10 MG tablet Take 10 mg by mouth daily.     cholecalciferol (VITAMIN D) 1000 units tablet Take 1,000 Units by mouth daily.     diltiazem (CARDIZEM CD) 120 MG 24 hr capsule Take 120 mg by mouth daily.     dorzolamide-timolol (COSOPT) 22.3-6.8 MG/ML ophthalmic solution Place 2 drops into the right eye at bedtime.   99   fluticasone (FLONASE) 50 MCG/ACT nasal spray Place 2 sprays into both nostrils daily.      gabapentin (NEURONTIN) 300 MG capsule TAKE 2 CAPSULES BY MOUTH  TWO TIMES DAILY 360 capsule 0   LORazepam (ATIVAN) 1 MG tablet Take 1 mg by mouth 2 (two) times daily as needed for anxiety.     mometasone (ELOCON) 0.1 % cream Apply 1  application topically daily.   3   Omega-3 Fatty Acids (FISH OIL) 1200 MG CAPS Take 1,200 mg by mouth daily.      Omeprazole (HM OMEPRAZOLE) 20 MG TBEC Take 20 mg by mouth 2 (two) times daily as needed (for heartburn).      QUEtiapine (SEROQUEL) 25 MG tablet Take 25 mg by mouth 2 (two) times daily.   6   ranitidine (ZANTAC) 150 MG capsule Take 150 mg by mouth every evening.      venlafaxine XR (EFFEXOR XR) 150 MG 24 hr capsule Take 150 mg by mouth daily.     No facility-administered medications prior to visit.     PAST MEDICAL HISTORY: Past Medical History:  Diagnosis Date   Anxiety    Fibromyalgia    GERD (gastroesophageal reflux disease)    High blood pressure    HSV (herpes simplex virus) anogenital infection    IBS (irritable bowel syndrome)    Memory loss    Neuropathy    Obesity (BMI 30-39.9) 04/01/2014   Osteoarthritis    Overactive bladder    Restless leg syndrome  Sleep apnea     PAST SURGICAL HISTORY: Past Surgical History:  Procedure Laterality Date   APPENDECTOMY     CATARACT EXTRACTION Left    CESAREAN SECTION     CHOLECYSTECTOMY     colonscopy     MULTIPLE TOOTH EXTRACTIONS     ROTATOR CUFF REPAIR Left    TOTAL KNEE ARTHROPLASTY      FAMILY HISTORY: Family History  Problem Relation Age of Onset   Depression Mother    Depression Father    Mental illness Sister    Mental illness Sister     SOCIAL HISTORY: Social History   Socioeconomic History   Marital status: Married    Spouse name: Dimas AguasHoward   Number of children: 2   Years of education: college   Highest education level: Not on file  Occupational History    Comment: retired  Ecologistocial Needs   Financial resource strain: Not on file   Food insecurity    Worry: Not on file    Inability: Not on Occupational hygienistfile   Transportation needs    Medical: Not on file    Non-medical: Not on file  Tobacco Use   Smoking status: Never Smoker   Smokeless tobacco: Never Used    Substance and Sexual Activity   Alcohol use: Yes    Comment: wine occas   Drug use: No   Sexual activity: Not on file  Lifestyle   Physical activity    Days per week: Not on file    Minutes per session: Not on file   Stress: Not on file  Relationships   Social connections    Talks on phone: Not on file    Gets together: Not on file    Attends religious service: Not on file    Active member of club or organization: Not on file    Attends meetings of clubs or organizations: Not on file    Relationship status: Not on file   Intimate partner violence    Fear of current or ex partner: Not on file    Emotionally abused: Not on file    Physically abused: Not on file    Forced sexual activity: Not on file  Other Topics Concern   Not on file  Social History Narrative   [name] and she is retired. Patient has 2 years of college. Patient has 2 children.     PHYSICAL EXAM  There were no vitals filed for this visit. There is no height or weight on file to calculate BMI.  Generalized: Well developed, in no acute distress, looks very well groomed MMSE - Mini Mental State Exam 09/18/2018 05/30/2017 02/08/2017  Orientation to time 1 5 4   Orientation to Place 5 4 5   Registration 0 3 3  Attention/ Calculation 5 3 5   Attention/Calculation-comments - - couldnt do numbers  Recall 1 2 1   Language- name 2 objects 2 2 2   Language- repeat 1 1 1   Language- follow 3 step command 2 2 2   Language- read & follow direction 1 1 1   Write a sentence 1 0 1  Copy design 1 1 1   Copy design-comments 8 animals named - -  Total score 20 24 26     Neurological examination  Mentation: Alert to year, some history taking. Follows all commands speech and language fluent Cranial nerve II-XII: Pupils were equal round reactive to light. Extraocular movements were full, visual field were full on confrontational test. Facial sensation  and strength were normal. Uvula  tongue midline. Head turning and shoulder shrug  were normal and symmetric. Motor: The motor testing reveals 4 over 5 strength of all 4 extremities. Good symmetric motor tone is noted throughout.  Sensory: Sensory testing is intact to soft touch on all 4 extremities. No evidence of extinction is noted.  Coordination: Cerebellar testing reveals good finger-nose-finger and heel-to-shin bilaterally.  Gait and station: Gait is normal. Reflexes: Deep tendon reflexes are symmetric and normal bilaterally.   DIAGNOSTIC DATA (LABS, IMAGING, TESTING) - I reviewed patient records, labs, notes, testing and imaging myself where available.  Lab Results  Component Value Date   WBC 12.3 (H) 12/17/2017   HGB 12.3 12/17/2017   HCT 38.0 12/17/2017   MCV 93.6 12/17/2017   PLT 200 12/17/2017      Component Value Date/Time   NA 137 12/17/2017 1156   K 3.5 12/17/2017 1156   CL 111 12/17/2017 1156   CO2 20 (L) 12/17/2017 1156   GLUCOSE 91 12/17/2017 1156   BUN 12 12/17/2017 1156   CREATININE 0.75 12/17/2017 1156   CALCIUM 8.8 (L) 12/17/2017 1156   PROT 6.4 (L) 12/17/2017 0240   ALBUMIN 3.4 (L) 12/17/2017 0240   AST 40 12/17/2017 0240   ALT 16 12/17/2017 0240   ALKPHOS 64 12/17/2017 0240   BILITOT 0.8 12/17/2017 0240   GFRNONAA >60 12/17/2017 1156   GFRAA >60 12/17/2017 1156   No results found for: CHOL, HDL, LDLCALC, LDLDIRECT, TRIG, CHOLHDL No results found for: ONGE9BHGBA1C No results found for: VITAMINB12 No results found for: TSH  ASSESSMENT AND PLAN 78 y.o. year old female  has a past medical history of Anxiety, Fibromyalgia, GERD (gastroesophageal reflux disease), High blood pressure, HSV (herpes simplex virus) anogenital infection, IBS (irritable bowel syndrome), Memory loss, Neuropathy, Obesity (BMI 30-39.9) (04/01/2014), Osteoarthritis, Overactive bladder, Restless leg syndrome, and Sleep apnea. here with:  1.  Mild cognitive impairment -She has had a decline in her memory score, today  20/30 -She would like to retry a memory medication, we will restart Aricept 5 mg at bedtime, of course if she has side effects she can stop the medication (tried in 2016, had stomach upset, weight loss) -I have encouraged her to consider assisted living for her and her husband, given her husband's declining health and memory -She does remain on Seroquel 25 mg daily prescribed by PCP, she does complain of some auditory hallucinations of hearing a man on the porch, but is not frightening, she is able to rationalize that it is not real -Fortunately she has good family support   2.  Bilateral lumbar radiculopathy -She will continue gabapentin -She completed physical therapy earlier this year with benefit -She will continue follow-up with Dr. Cleophas DunkerWhitfield -Per Dr. Zannie CoveYan's last note, review of MRI Lumbar Spine, nothing surgical at current  I spent 25 minutes with the patient. 50% of this time was spent discussing her plan of care.    Margie EgeSarah Daleyssa Loiselle, AGNP-C, DNP 09/18/2018, 12:22 PM [facility] [address], [address] [place], [place] [address] [phone]

## 2018-09-18 ENCOUNTER — Other Ambulatory Visit: Payer: Self-pay

## 2018-09-18 ENCOUNTER — Ambulatory Visit (INDEPENDENT_AMBULATORY_CARE_PROVIDER_SITE_OTHER): Payer: Medicare Other | Admitting: Neurology

## 2018-09-18 ENCOUNTER — Encounter: Payer: Self-pay | Admitting: Neurology

## 2018-09-18 VITALS — BP 134/79 | HR 77 | Temp 98.6°F | Ht 65.0 in | Wt 140.8 lb

## 2018-09-18 DIAGNOSIS — R413 Other amnesia: Secondary | ICD-10-CM | POA: Diagnosis not present

## 2018-09-18 DIAGNOSIS — M5416 Radiculopathy, lumbar region: Secondary | ICD-10-CM

## 2018-09-18 MED ORDER — DONEPEZIL HCL 5 MG PO TABS: 5.0000 mg | ORAL_TABLET | Freq: Every day | ORAL | 3 refills | Status: DC

## 2018-09-18 NOTE — Progress Notes (Signed)
I have reviewed and agreed above plan. 

## 2018-09-18 NOTE — Patient Instructions (Signed)
It was wonderful to meet you both today!  

## 2018-11-22 ENCOUNTER — Other Ambulatory Visit: Payer: Self-pay | Admitting: Family Medicine

## 2018-11-22 DIAGNOSIS — Z1231 Encounter for screening mammogram for malignant neoplasm of breast: Secondary | ICD-10-CM

## 2019-01-09 ENCOUNTER — Ambulatory Visit: Payer: Medicare Other

## 2019-01-09 ENCOUNTER — Other Ambulatory Visit: Payer: Self-pay

## 2019-01-09 ENCOUNTER — Ambulatory Visit
Admission: RE | Admit: 2019-01-09 | Discharge: 2019-01-09 | Disposition: A | Discharge status: Home / Self Care | Payer: Medicare Other | Source: Ambulatory Visit | Attending: Family Medicine | Admitting: Family Medicine

## 2019-01-09 DIAGNOSIS — Z1231 Encounter for screening mammogram for malignant neoplasm of breast: Secondary | ICD-10-CM

## 2019-02-04 ENCOUNTER — Other Ambulatory Visit: Payer: Self-pay | Admitting: Neurology

## 2019-02-26 ENCOUNTER — Telehealth: Payer: Self-pay | Admitting: Rheumatology

## 2019-02-26 NOTE — Telephone Encounter (Signed)
Patient left a voicemail requesting prescription refill of Gabapentin.  Patient is requesting it be sent to her new pharmacy:  [facility]

## 2019-02-26 NOTE — Telephone Encounter (Signed)
We have not seen Lynn Price in over 1 year.  The plan was to taper off gabapentin.  I will give prescription for gabapentin 300 mg at bedtime only.  There is increased risk of falling and drowsiness from the medication.  She may get further refills from her PCP.

## 2019-02-26 NOTE — Telephone Encounter (Signed)
Attempted to contact patient and left message on machine for patient to call the office.  

## 2019-02-26 NOTE — Telephone Encounter (Signed)
Last Visit: 02/02/2018 Next Visit: called patient to schedule and she declined to schedule right now.  Patient would like to know if she should still be taking gabapentin? Per patient, she has been off of gabapentin for 1 week.  Per note on 02/02/2018, She was on a lower dose but she recently increase it to 600 twice daily.  We had detailed discussion regarding decreasing dose to 300 mg in the morning and 600 mg in the evening.  If she tolerates that she can decrease it to 300 twice daily.  Please advise.

## 2019-03-07 MED ORDER — GABAPENTIN 300 MG PO CAPS: ORAL_CAPSULE | ORAL | 0 refills | Status: DC

## 2019-03-07 NOTE — Addendum Note (Signed)
Addended by: Ellen Henri on: 03/07/2019 04:05 PM   Modules accepted: Orders

## 2019-03-07 NOTE — Telephone Encounter (Signed)
Patient returned the call from 02/26/2019. I advised patient The plan was to taper off gabapentin.  I will give prescription for gabapentin 300 mg at bedtime only.  There is increased risk of falling and drowsiness from the medication.  She may get further refills from her PCP. Patient verbalized understanding. Prescription sent to [facility].

## 2019-03-27 ENCOUNTER — Ambulatory Visit: Payer: Medicare Other | Admitting: Neurology

## 2019-03-30 ENCOUNTER — Emergency Department (HOSPITAL_COMMUNITY)
Admission: EM | Admit: 2019-03-30 | Discharge: 2019-03-30 | Discharge status: Absent without leave | Payer: Medicare Other

## 2019-03-30 ENCOUNTER — Other Ambulatory Visit: Payer: Self-pay

## 2019-04-09 ENCOUNTER — Other Ambulatory Visit: Payer: Self-pay | Admitting: Rheumatology

## 2019-04-13 ENCOUNTER — Emergency Department (HOSPITAL_COMMUNITY)
Admission: EM | Admit: 2019-04-13 | Discharge: 2019-04-13 | Disposition: A | Discharge status: Home / Self Care | Payer: Medicare Other | Attending: Emergency Medicine | Admitting: Emergency Medicine

## 2019-04-13 ENCOUNTER — Other Ambulatory Visit: Payer: Self-pay

## 2019-04-13 DIAGNOSIS — G8929 Other chronic pain: Secondary | ICD-10-CM | POA: Insufficient documentation

## 2019-04-13 DIAGNOSIS — M542 Cervicalgia: Secondary | ICD-10-CM | POA: Diagnosis present

## 2019-04-13 DIAGNOSIS — Z79899 Other long term (current) drug therapy: Secondary | ICD-10-CM | POA: Insufficient documentation

## 2019-04-13 MED ORDER — ACETAMINOPHEN 325 MG PO TABS
325.0000 mg | ORAL_TABLET | Freq: Once | ORAL | Status: AC
  Administered 2019-04-13: 325 mg via ORAL
  Filled 2019-04-13: qty 1

## 2019-04-13 NOTE — ED Notes (Signed)
Ptar notified by [employer]

## 2019-04-13 NOTE — ED Triage Notes (Signed)
Per [facility], pt from [facility], c/o neck pain x2 months. Both sides of neck radiating to her shoulders. Pt states she fell 3 days ago in the shower, now her neck pain is worse. Hx of back/nerve issues, takes gabapentin normally but has been out for the last 2-3 weeks. Vss, aaox4. nad

## 2019-04-13 NOTE — Discharge Instructions (Addendum)
To [name],  Thank you for choosing [facility] for your medical maintenance. Today you were evaluated for your neck pain. After reviewing your labs and a physical examination, your pain appears to be musculoskeletal in nature. You can take two tylenol pills 4 times a day as needed for your pain and try a heating pad for your pain. Please reach out to your Primary Care physician for long term management of your neck pain. Please come back for reevaluation at the emergency department if your pain increases to an intolerable level, you notice sudden onset weakness/numbness, or acute changes in your ability to function normally.

## 2019-04-13 NOTE — ED Provider Notes (Signed)
[facility] EMERGENCY DEPARTMENT Provider Note   CSN: [id number] Arrival date & time: 04/13/19  1631  History Chief Complaint  Patient presents with  . Neck Pain   Lynn Price is an 79 y/o female, with a PMH of fibromyalgia, anxiety, GERd, Neuropathy, osteoarthritis, who comes to [facility] with complaints of neck pain. Patient states that her symptoms began 2 months ago. She states that her pain has been a 3/10 on the pain scale and is present along the paraspinal muscles bilaterally and is constant. She states that she does occasionally also have a "tingling/fluttering" sensation down her L upper extremity. Patient states that she also had a fall in her bathtub 3 days ago that aggravated her pain to a 4/10. She states that during the fall, she grabbed onto the hand rail and slid back into the tub. Patient states that she has not tried anything to alleviate her pain, but states that maybe a heating pad would help. She denies headaches, changes in vision, fevers, nausea, vomiting, paraesthesias or sudden weakness.     Past Medical History:  Diagnosis Date  . Anxiety   . Fibromyalgia   . GERD (gastroesophageal reflux disease)   . High blood pressure   . HSV (herpes simplex virus) anogenital infection   . IBS (irritable bowel syndrome)   . Memory loss   . Neuropathy   . Obesity (BMI 30-39.9) 04/01/2014  . Osteoarthritis   . Overactive bladder   . Restless leg syndrome   . Sleep apnea     Patient Active Problem List   Diagnosis Date Noted  . Nausea & vomiting 12/16/2017  . Diarrhea 12/16/2017  . Hypokalemia 12/16/2017  . Gait abnormality 08/30/2017  . Lumbar radiculopathy 08/30/2017  . Chronic left-sided low back pain with left-sided sciatica 07/21/2017  . Personal history of calcium pyrophosphate deposition disease (CPPD) 07/29/2016  . History of peripheral neuropathy 07/29/2016  . History of total knee replacement, right 07/22/2016  . Pseudogout 01/22/2016  .  Primary osteoarthritis of both knees 01/22/2016  . Primary osteoarthritis of both hands 01/22/2016  . Primary osteoarthritis of both feet 01/22/2016  . Fibromyalgia 01/22/2016  . Primary insomnia 01/22/2016  . Obesity (BMI 30-39.9) 04/01/2014  . Obstructive sleep apnea 04/19/2013  . Essential hypertension, benign 04/19/2013  . Memory loss 08/02/2012    Past Surgical History:  Procedure Laterality Date  . APPENDECTOMY    . CATARACT EXTRACTION Left   . CESAREAN SECTION    . CHOLECYSTECTOMY    . colonscopy    . MULTIPLE TOOTH EXTRACTIONS    . ROTATOR CUFF REPAIR Left   . TOTAL KNEE ARTHROPLASTY       OB History   No obstetric history on file.     Family History  Problem Relation Age of Onset  . Depression Mother   . Depression Father   . Mental illness Sister   . Mental illness Sister     Social History   Tobacco Use  . Smoking status: Never Smoker  . Smokeless tobacco: Never Used  Substance Use Topics  . Alcohol use: Yes    Comment: wine occas  . Drug use: No    Home Medications Prior to Admission medications   Medication Sig Start Date End Date Taking? Authorizing Provider  acetaminophen (TYLENOL) 500 MG tablet Take 325 mg by mouth 2 (two) times daily as needed for mild pain.     [provider]  acyclovir (ZOVIRAX) 400 MG tablet Take 400 mg  by mouth 5 (five) times daily.    [provider]  cetirizine (ZYRTEC) 10 MG tablet Take 10 mg by mouth daily.    [provider]  cholecalciferol (VITAMIN D) 1000 units tablet Take 1,000 Units by mouth daily.    [provider]  diltiazem (CARDIZEM CD) 120 MG 24 hr capsule Take 120 mg by mouth daily.    [provider]  donepezil (ARICEPT) 5 MG tablet TAKE 1 TABLET (5 MG TOTAL) BY MOUTH AT BEDTIME. 02/05/19   Suzzanne Cloud, NP  dorzolamide-timolol (COSOPT) 22.3-6.8 MG/ML ophthalmic solution Place 2 drops into the right eye at bedtime.  08/15/17   [provider]    fluticasone (FLONASE) 50 MCG/ACT nasal spray Place 2 sprays into both nostrils daily.     [provider]  gabapentin (NEURONTIN) 300 MG capsule Take 1 capsule by mouth at bedtime only. 03/07/19   Bo Merino, MD  LORazepam (ATIVAN) 1 MG tablet Take 1 mg by mouth 2 (two) times daily as needed for anxiety.    [provider]  mometasone (ELOCON) 0.1 % cream Apply 1 application topically daily.  03/27/17   [provider]  Omega-3 Fatty Acids (FISH OIL) 1200 MG CAPS Take 1,200 mg by mouth daily.     [provider]  Omeprazole (HM OMEPRAZOLE) 20 MG TBEC Take 20 mg by mouth 2 (two) times daily as needed (for heartburn).     [provider]  QUEtiapine (SEROQUEL) 25 MG tablet Take 25 mg by mouth 2 (two) times daily.  05/22/17   [provider]  ranitidine (ZANTAC) 150 MG capsule Take 150 mg by mouth every evening.     [provider]  venlafaxine XR (EFFEXOR XR) 150 MG 24 hr capsule Take 150 mg by mouth daily.    [provider]    Allergies    Erythromycin, Penicillins, Sulfa antibiotics, and Tetracyclines & related  Review of Systems   Negative with exception to those found in the HPI.  Physical Exam Updated Vital Signs BP (!) 145/74   Pulse 77   Temp 98.2 F (36.8 C)   Resp 16   Ht 5\' 5"  (1.651 m)   Wt 60.3 kg   SpO2 100%   BMI 22.13 kg/m   Physical Exam Vitals and nursing note reviewed.  Constitutional:      General: She is not in acute distress.    Appearance: Normal appearance. She is normal weight. She is not ill-appearing, toxic-appearing or diaphoretic.  HENT:     Head: Normocephalic and atraumatic.     Comments: Slight tremor of the lower jaw noted on examination.     Right Ear: External ear normal.     Nose: Nose normal.     Mouth/Throat:     Mouth: Mucous membranes are moist.     Pharynx: Oropharynx is clear.  Eyes:     General: No scleral icterus.       Right eye: No discharge.         Left eye: No discharge.     Extraocular Movements: Extraocular movements intact.     Conjunctiva/sclera: Conjunctivae normal.     Pupils: Pupils are equal, round, and reactive to light.  Neck:     Vascular: No carotid bruit.     Comments: Negative Spurling sign.  Cardiovascular:     Rate and Rhythm: Normal rate and regular rhythm.     Pulses: Normal pulses.     Heart sounds: Normal heart  sounds. No murmur. No friction rub. No gallop.   Pulmonary:     Effort: Pulmonary effort is normal.     Breath sounds: Normal breath sounds. No wheezing, rhonchi or rales.  Abdominal:     General: Abdomen is flat. Bowel sounds are normal.     Palpations: Abdomen is soft.     Tenderness: There is no abdominal tenderness. There is no guarding.  Musculoskeletal:        General: No tenderness. Normal range of motion.     Cervical back: Normal range of motion. No rigidity or tenderness.     Right lower leg: No edema.     Left lower leg: No edema.     Comments: 5/5 strength bilaterally in the upper and lower extremities.   Lymphadenopathy:     Cervical: No cervical adenopathy.  Skin:    General: Skin is warm and dry.  Neurological:     General: No focal deficit present.     Mental Status: She is alert and oriented to person, place, and time.     Comments: CN II-XII intact bilaterally.  Psychiatric:        Mood and Affect: Mood normal.        Behavior: Behavior normal.     ED Results / Procedures / Treatments   Labs (all labs ordered are listed, but only abnormal results are displayed) Labs Reviewed - No data to display  EKG None  Radiology No results found.  Medications Ordered in ED Medications  acetaminophen (TYLENOL) tablet 325 mg (325 mg Oral Given 04/13/19 1749)    ED Course  I have reviewed the triage vital signs and the nursing notes.  Pertinent labs & imaging results that were available during my care of the patient were reviewed by me and considered in my medical decision  making (see chart for details).  Lynn. Xana Bradt is an 79 y/o female with a PMH of fibromyalgia, anxiety, GERd, Neuropathy, osteoarthritis, who comes to [facility] with complaints of neck pain.  Upon presentation, patient has had 3/4 neck pain along the cervical paraspinal muscles for 2 months. Physical examination did not show radiculopathy/nerve impingement. Patient stating that her fall 3 days ago was more akin to sliding down the side of the tub with no head trauma or loss of consciousness.Patient is fully alert with no complaints of weakness. Due to unremarkable physical examination, with chronic neck pain, patient was recommended to follow up with PCP in the outpatient setting and to use tylenol and a heating pad for symptomatic contol. Patient given strict return precautions. Patient voiced understanding. She was discharged home in stable condition.  Final Clinical Impression(s) / ED Diagnoses Final diagnoses:  Musculoskeletal neck pain    Rx / DC Orders ED Discharge Orders    None       Dolan Amen, MD 04/13/19 2024    Clarene Duke Ambrose Finland, MD 04/16/19 1547

## 2019-04-24 ENCOUNTER — Encounter: Payer: Self-pay | Admitting: Rheumatology

## 2019-06-08 ENCOUNTER — Emergency Department (HOSPITAL_COMMUNITY): Payer: Medicare Other

## 2019-06-08 ENCOUNTER — Other Ambulatory Visit: Payer: Self-pay

## 2019-06-08 ENCOUNTER — Emergency Department (HOSPITAL_COMMUNITY)
Admission: EM | Admit: 2019-06-08 | Discharge: 2019-06-09 | Disposition: A | Discharge status: Home / Self Care | Payer: Medicare Other | Attending: Emergency Medicine | Admitting: Emergency Medicine

## 2019-06-08 ENCOUNTER — Encounter (HOSPITAL_COMMUNITY): Payer: Self-pay

## 2019-06-08 DIAGNOSIS — Z9049 Acquired absence of other specified parts of digestive tract: Secondary | ICD-10-CM | POA: Diagnosis not present

## 2019-06-08 DIAGNOSIS — I10 Essential (primary) hypertension: Secondary | ICD-10-CM | POA: Insufficient documentation

## 2019-06-08 DIAGNOSIS — Z79899 Other long term (current) drug therapy: Secondary | ICD-10-CM | POA: Diagnosis not present

## 2019-06-08 DIAGNOSIS — K5900 Constipation, unspecified: Secondary | ICD-10-CM | POA: Insufficient documentation

## 2019-06-08 DIAGNOSIS — Z96651 Presence of right artificial knee joint: Secondary | ICD-10-CM | POA: Insufficient documentation

## 2019-06-08 DIAGNOSIS — R109 Unspecified abdominal pain: Secondary | ICD-10-CM

## 2019-06-08 LAB — CBC WITH DIFFERENTIAL/PLATELET
Abs Immature Granulocytes: 0.03 10*3/uL (ref 0.00–0.07)
Basophils Absolute: 0.1 10*3/uL (ref 0.0–0.1)
Basophils Relative: 1 %
Eosinophils Absolute: 0.2 10*3/uL (ref 0.0–0.5)
Eosinophils Relative: 3 %
HCT: 37.9 % (ref 36.0–46.0)
Hemoglobin: 12.2 g/dL (ref 12.0–15.0)
Immature Granulocytes: 0 %
Lymphocytes Relative: 25 %
Lymphs Abs: 2 10*3/uL (ref 0.7–4.0)
MCH: 30.5 pg (ref 26.0–34.0)
MCHC: 32.2 g/dL (ref 30.0–36.0)
MCV: 94.8 fL (ref 80.0–100.0)
Monocytes Absolute: 0.8 10*3/uL (ref 0.1–1.0)
Monocytes Relative: 10 %
Neutro Abs: 4.8 10*3/uL (ref 1.7–7.7)
Neutrophils Relative %: 61 %
Platelets: 212 10*3/uL (ref 150–400)
RBC: 4 MIL/uL (ref 3.87–5.11)
RDW: 13.4 % (ref 11.5–15.5)
WBC: 7.9 10*3/uL (ref 4.0–10.5)
nRBC: 0 % (ref 0.0–0.2)

## 2019-06-08 LAB — COMPREHENSIVE METABOLIC PANEL
ALT: 14 U/L (ref 0–44)
AST: 19 U/L (ref 15–41)
Albumin: 4 g/dL (ref 3.5–5.0)
Alkaline Phosphatase: 76 U/L (ref 38–126)
Anion gap: 12 (ref 5–15)
BUN: 23 mg/dL (ref 8–23)
CO2: 23 mmol/L (ref 22–32)
Calcium: 9.6 mg/dL (ref 8.9–10.3)
Chloride: 104 mmol/L (ref 98–111)
Creatinine, Ser: 0.82 mg/dL (ref 0.44–1.00)
GFR calc Af Amer: >60 mL/min (ref >60)
GFR calc non Af Amer: >60 mL/min (ref >60)
Glucose, Bld: 92 mg/dL (ref 70–99)
Potassium: 4.3 mmol/L (ref 3.5–5.1)
Sodium: 139 mmol/L (ref 135–145)
Total Bilirubin: 0.5 mg/dL (ref 0.3–1.2)
Total Protein: 7.2 g/dL (ref 6.5–8.1)

## 2019-06-08 LAB — LIPASE, BLOOD: Lipase: 39 U/L (ref 11–51)

## 2019-06-08 MED ORDER — LIDOCAINE HCL URETHRAL/MUCOSAL 2 % EX GEL
1.0000 "application " | Freq: Once | CUTANEOUS | Status: AC
  Administered 2019-06-08: 1 via TOPICAL
  Filled 2019-06-08: qty 30

## 2019-06-08 MED ORDER — SODIUM CHLORIDE 0.9 % IV BOLUS
1000.0000 mL | Freq: Once | INTRAVENOUS | Status: AC
  Administered 2019-06-08: 1000 mL via INTRAVENOUS

## 2019-06-08 MED ORDER — LORAZEPAM 1 MG PO TABS
1.0000 mg | ORAL_TABLET | Freq: Once | ORAL | Status: AC
  Administered 2019-06-08: 21:00:00 1 mg via ORAL
  Filled 2019-06-08: qty 1

## 2019-06-08 MED ORDER — SODIUM CHLORIDE (PF) 0.9 % IJ SOLN
INTRAMUSCULAR | Status: AC
  Filled 2019-06-08: qty 50

## 2019-06-08 MED ORDER — IOHEXOL 300 MG/ML  SOLN
100.0000 mL | Freq: Once | INTRAMUSCULAR | Status: AC | PRN
  Administered 2019-06-08: 22:00:00 100 mL via INTRAVENOUS

## 2019-06-08 MED ORDER — POLYETHYLENE GLYCOL 3350 17 G PO PACK: 17.0000 g | PACK | Freq: Every day | ORAL | 0 refills | Status: DC

## 2019-06-08 MED ORDER — ACETAMINOPHEN 325 MG PO TABS
650.0000 mg | ORAL_TABLET | Freq: Once | ORAL | Status: AC
  Administered 2019-06-08: 21:00:00 650 mg via ORAL
  Filled 2019-06-08: qty 2

## 2019-06-08 NOTE — ED Notes (Signed)
Pt up to bedside commode. Unfortunately pt had a bowel movement with urination and did not catch a sample. Will try again.

## 2019-06-08 NOTE — ED Triage Notes (Signed)
Pt brought in by EMS for constipation. Per pt last BM was 3 days ago.

## 2019-06-08 NOTE — ED Triage Notes (Signed)
Pt BIB EMS from [facility]. Pt c/o constipation for last week. Pt stated she took OTC stool softeners and laxatives with no relief.   124/90 80 HR 20 Resp 98% RA CBG 103 98.4

## 2019-06-08 NOTE — Discharge Instructions (Addendum)
Take Miralax as directed.   Please follow up with your primary doctor within the next 5-7 days.  If you do not have a primary care provider, information for a healthcare clinic has been provided for you to make arrangements for follow up care. Please return to the ER sooner if you have any new or worsening symptoms, or if you have any of the following symptoms:  Abdominal pain that does not go away.  You have a fever.  You keep throwing up (vomiting).  The pain is felt only in portions of the abdomen. Pain in the right side could possibly be appendicitis. In an adult, pain in the left lower portion of the abdomen could be colitis or diverticulitis.  You pass bloody or black tarry stools.  There is bright red blood in the stool.  The constipation stays for more than 4 days.  There is belly (abdominal) or rectal pain.  You do not seem to be getting better.  You have any questions or concerns.

## 2019-06-08 NOTE — ED Notes (Signed)
Pt lying in bed. Monitor on. NAD noted. Will continue to monitor.  

## 2019-06-08 NOTE — ED Provider Notes (Signed)
[facility] DEPT Provider Note   CSN: [id number] Arrival date & time: 06/08/19  1948     History Chief Complaint  Patient presents with  . Constipation    Lynn Price is a 79 y.o. female.  HPI   Patient is an 79 year old female with a history of anxiety, fibromyalgia, GERD, hypertension, HSV, IBS, memory loss, neuropathy, osteoarthritis, overactive bladder, who presents the emergency department today for evaluation of constipation and lower abdominal pain.  States for the last 3 to 4 days she has had constant 7-8/10 lower abdominal pain.  Has been worsening since onset.  She also states she has not had a bowel movement in the last 3 to 4 days.  She has tried taking several doses of MiraLAX without relief.  She denies any nausea, vomiting, fevers, dysuria, frequency, urgency or other symptoms.  Additionally, patient is tearful during evaluation and states she is feeling anxious and sad because her son was able to visit her for the first time in a year and just left this morning.  She is requesting her home Ativan for her symptoms.  Past Medical History:  Diagnosis Date  . Anxiety   . Fibromyalgia   . GERD (gastroesophageal reflux disease)   . High blood pressure   . HSV (herpes simplex virus) anogenital infection   . IBS (irritable bowel syndrome)   . Memory loss   . Neuropathy   . Obesity (BMI 30-39.9) 04/01/2014  . Osteoarthritis   . Overactive bladder   . Restless leg syndrome   . Sleep apnea     Patient Active Problem List   Diagnosis Date Noted  . Nausea & vomiting 12/16/2017  . Diarrhea 12/16/2017  . Hypokalemia 12/16/2017  . Gait abnormality 08/30/2017  . Lumbar radiculopathy 08/30/2017  . Chronic left-sided low back pain with left-sided sciatica 07/21/2017  . Personal history of calcium pyrophosphate deposition disease (CPPD) 07/29/2016  . History of peripheral neuropathy 07/29/2016  . History of total knee replacement, right  07/22/2016  . Pseudogout 01/22/2016  . Primary osteoarthritis of both knees 01/22/2016  . Primary osteoarthritis of both hands 01/22/2016  . Primary osteoarthritis of both feet 01/22/2016  . Fibromyalgia 01/22/2016  . Primary insomnia 01/22/2016  . Obesity (BMI 30-39.9) 04/01/2014  . Obstructive sleep apnea 04/19/2013  . Essential hypertension, benign 04/19/2013  . Memory loss 08/02/2012    Past Surgical History:  Procedure Laterality Date  . APPENDECTOMY    . CATARACT EXTRACTION Left   . CESAREAN SECTION    . CHOLECYSTECTOMY    . colonscopy    . MULTIPLE TOOTH EXTRACTIONS    . ROTATOR CUFF REPAIR Left   . TOTAL KNEE ARTHROPLASTY       OB History   No obstetric history on file.     Family History  Problem Relation Age of Onset  . Depression Mother   . Depression Father   . Mental illness Sister   . Mental illness Sister     Social History   Tobacco Use  . Smoking status: Never Smoker  . Smokeless tobacco: Never Used  Substance Use Topics  . Alcohol use: Yes    Comment: wine occas  . Drug use: No    Home Medications Prior to Admission medications   Medication Sig Start Date End Date Taking? Authorizing Provider  acetaminophen (TYLENOL) 500 MG tablet Take 325 mg by mouth 2 (two) times daily as needed for mild pain.     [provider]  acyclovir (ZOVIRAX) 400 MG tablet Take 400 mg by mouth 5 (five) times daily.    [provider]  cetirizine (ZYRTEC) 10 MG tablet Take 10 mg by mouth daily.    [provider]  cholecalciferol (VITAMIN D) 1000 units tablet Take 1,000 Units by mouth daily.    [provider]  diltiazem (CARDIZEM CD) 120 MG 24 hr capsule Take 120 mg by mouth daily.    [provider]  donepezil (ARICEPT) 5 MG tablet TAKE 1 TABLET (5 MG TOTAL) BY MOUTH AT BEDTIME. 02/05/19   Glean Salvo, NP  dorzolamide-timolol (COSOPT) 22.3-6.8 MG/ML ophthalmic solution Place 2 drops into the right eye at bedtime.   08/15/17   [provider]  fluticasone (FLONASE) 50 MCG/ACT nasal spray Place 2 sprays into both nostrils daily.     [provider]  gabapentin (NEURONTIN) 300 MG capsule Take 1 capsule by mouth at bedtime only. 03/07/19   Pollyann Savoy, MD  LORazepam (ATIVAN) 1 MG tablet Take 1 mg by mouth 2 (two) times daily as needed for anxiety.    [provider]  mometasone (ELOCON) 0.1 % cream Apply 1 application topically daily.  03/27/17   [provider]  Omega-3 Fatty Acids (FISH OIL) 1200 MG CAPS Take 1,200 mg by mouth daily.     [provider]  Omeprazole (HM OMEPRAZOLE) 20 MG TBEC Take 20 mg by mouth 2 (two) times daily as needed (for heartburn).     [provider]  polyethylene glycol (MIRALAX) 17 g packet Take 17 g by mouth daily. Dissolve one cap full in solution (water, gatorade, etc.) and administer once cap-full daily. You may titrate up daily by 1 cap-full until the patient is having pudding consistency of stools. After the patient is able to start passing softer stools they will need to be on 1/2 cap-full daily for 2 weeks. 06/08/19   Angelys Yetman S, PA-C  QUEtiapine (SEROQUEL) 25 MG tablet Take 25 mg by mouth 2 (two) times daily.  05/22/17   [provider]  ranitidine (ZANTAC) 150 MG capsule Take 150 mg by mouth every evening.     [provider]  venlafaxine XR (EFFEXOR XR) 150 MG 24 hr capsule Take 150 mg by mouth daily.    [provider]    Allergies    Erythromycin, Penicillins, Sulfa antibiotics, and Tetracyclines & related  Review of Systems   Review of Systems  Constitutional: Negative for fever.  HENT: Negative for ear pain and sore throat.   Eyes: Negative for visual disturbance.  Respiratory: Negative for cough and shortness of breath.   Cardiovascular: Negative for chest pain.  Gastrointestinal: Positive for abdominal pain and constipation. Negative for diarrhea, nausea and vomiting.    Genitourinary: Negative for dysuria and hematuria.  Musculoskeletal: Negative for back pain.  Skin: Negative for rash.  Neurological: Negative for headaches.  All other systems reviewed and are negative.   Physical Exam Updated Vital Signs BP 140/84 (BP Location: Right Arm)   Pulse 81   Temp 98.4 F (36.9 C) (Oral)   Resp 18   SpO2 99%   Physical Exam Vitals and nursing note reviewed.  Constitutional:      General: She is not in acute distress.    Appearance: She is well-developed.  HENT:     Head: Normocephalic and atraumatic.  Eyes:     Conjunctiva/sclera: Conjunctivae normal.  Cardiovascular:     Rate and Rhythm: Normal rate and regular rhythm.  Heart sounds: Normal heart sounds. No murmur.  Pulmonary:     Effort: Pulmonary effort is normal. No respiratory distress.     Breath sounds: Normal breath sounds. No wheezing, rhonchi or rales.  Abdominal:     General: Bowel sounds are increased.     Palpations: Abdomen is soft.     Tenderness: There is abdominal tenderness (rlq and suprapubic).  Musculoskeletal:     Cervical back: Neck supple.  Skin:    General: Skin is warm and dry.  Neurological:     Mental Status: She is alert.     ED Results / Procedures / Treatments   Labs (all labs ordered are listed, but only abnormal results are displayed) Labs Reviewed  CBC WITH DIFFERENTIAL/PLATELET  COMPREHENSIVE METABOLIC PANEL  LIPASE, BLOOD  URINALYSIS, ROUTINE W REFLEX MICROSCOPIC    EKG None  Radiology No results found.  Procedures Fecal disimpaction  Date/Time: 06/08/2019 8:57 PM Performed by: Karrie Meres, PA-C Authorized by: Karrie Meres, PA-C  Consent: Verbal consent obtained. Risks and benefits: risks, benefits and alternatives were discussed Consent given by: patient Patient understanding: patient states understanding of the procedure being performed Patient consent: the patient's understanding of the procedure matches consent  given Procedure consent: procedure consent matches procedure scheduled Relevant documents: relevant documents present and verified Test results: test results available and properly labeled Site marked: the operative site was marked Imaging studies: imaging studies available Patient identity confirmed: verbally with patient Time out: Immediately prior to procedure a "time out" was called to verify the correct patient, procedure, equipment, support staff and site/side marked as required. Local anesthesia used: no  Anesthesia: Local anesthesia used: no  Sedation: Patient sedated: no  Patient tolerance: patient tolerated the procedure well with no immediate complications    (including critical care time)  Medications Ordered in ED Medications  lidocaine (XYLOCAINE) 2 % jelly 1 application (has no administration in time range)  acetaminophen (TYLENOL) tablet 650 mg (has no administration in time range)  sodium chloride 0.9 % bolus 1,000 mL (1,000 mLs Intravenous New Bag/Given (Non-Interop) 06/08/19 2054)  LORazepam (ATIVAN) tablet 1 mg (1 mg Oral Given 06/08/19 2044)    ED Course  I have reviewed the triage vital signs and the nursing notes.  Pertinent labs & imaging results that were available during my care of the patient were reviewed by me and considered in my medical decision making (see chart for details).    MDM Rules/Calculators/A&P                      79 year old female presenting for evaluation of constipation and abdominal pain ongoing for the last 3 to 4 days.    Denies any other associated symptoms at this time.  She has positive bowel sounds and abdomen is completely soft.  Disimpaction was performed and patient had significant amount of output.  She did report some improvement in her symptoms following this.  Will obtain labs and CT abdomen/pelvis to rule out complicating features.  At shift change, patient signed out to [facility], PA-C with plan to  follow-up on pending laboratory work and imaging studies.  If negative patient likely safe for discharge home with bowel regimen.   Final Clinical Impression(s) / ED Diagnoses Final diagnoses:  Constipation, unspecified constipation type  Abdominal pain, unspecified abdominal location    Rx / DC Orders ED Discharge Orders         Ordered    polyethylene glycol (MIRALAX) 17  g packet  Daily     06/08/19 2057           Rayne Du 06/08/19 2058    Gerhard Munch, MD 06/09/19 2207

## 2019-06-09 LAB — URINALYSIS, ROUTINE W REFLEX MICROSCOPIC
Bacteria, UA: NONE SEEN
Bilirubin Urine: NEGATIVE
Glucose, UA: NEGATIVE mg/dL
Hgb urine dipstick: NEGATIVE
Ketones, ur: 5 mg/dL — AB
Leukocytes,Ua: NEGATIVE
Nitrite: POSITIVE — AB
Protein, ur: NEGATIVE mg/dL
Specific Gravity, Urine: 1.019 (ref 1.005–1.030)
pH: 8 (ref 5.0–8.0)

## 2019-06-09 NOTE — ED Provider Notes (Signed)
12:29 AM Patient pending urinalysis at shift change.  These results have been reviewed.  While urine is positive for nitrites, there is no bacteriuria or pyuria on clean-catch specimen.  Urinalysis not specifically concerning for urinary tract infection despite report of CT imaging.  Patient also denied dysuria, frequency, urgency on initial assessment.  Will add on urine culture.  The patient does not meet criteria for SIRS or sepsis.  Likely uncomplicated constipation.  Stable for discharge and follow-up with her primary care doctor.  Return precautions given.  Vitals:   06/08/19 2008 06/08/19 2100 06/08/19 2124 06/08/19 2300  BP: 140/84 (!) 146/80  (!) 148/81  Pulse: 81 80  76  Resp: 18   16  Temp: 98.4 F (36.9 C)     TempSrc: Oral     SpO2: 99% 100%  100%  Weight:   61.2 kg   Height:   5\' 5"  (1.651 m)       , PA-C 06/09/19 0031    08/09/19, MD 06/09/19 [phone]

## 2019-06-09 NOTE — ED Notes (Signed)
Pt lying in bed, eye's closed, chest rising and falling. EMS here for transport. Pt awakens easily. Pt ready for d/c to [facility]

## 2019-06-09 NOTE — ED Provider Notes (Addendum)
21:00: Assumed care of patient from [place] PA-C at change of shift pending labs, CT, and disposition.   Please see prior provider note for full H&P.  Briefly patient is an 79 year old female who presented to the emergency department for evaluation of lower abdominal pain and constipation.  Last bowel movement was 4 days prior.  Pain continued to worsen prompting ED visit.  Physical Exam  BP (!) 148/81   Pulse 76   Temp 98.4 F (36.9 C) (Oral)   Resp 16   Ht 5\' 5"  (1.651 m)   Wt 61.2 kg   SpO2 100%   BMI 22.47 kg/m   Physical Exam Vitals and nursing note reviewed.  Constitutional:      General: She is not in acute distress.    Appearance: She is well-developed.  HENT:     Head: Normocephalic and atraumatic.  Eyes:     General:        Right eye: No discharge.        Left eye: No discharge.     Conjunctiva/sclera: Conjunctivae normal.  Abdominal:     General: There is no distension.     Palpations: Abdomen is soft.     Tenderness: There is abdominal tenderness (mild lower abdomen). There is no guarding or rebound.  Neurological:     Mental Status: She is alert.     Comments: Clear speech.   Psychiatric:        Behavior: Behavior normal.        Thought Content: Thought content normal.    ED Course/Procedures    Results for orders placed or performed during the hospital encounter of 06/08/19  CBC with Differential  Result Value Ref Range   WBC 7.9 4.0 - 10.5 K/uL   RBC 4.00 3.87 - 5.11 MIL/uL   Hemoglobin 12.2 12.0 - 15.0 g/dL   HCT 37.9 36.0 - 46.0 %   MCV 94.8 80.0 - 100.0 fL   MCH 30.5 26.0 - 34.0 pg   MCHC 32.2 30.0 - 36.0 g/dL   RDW 13.4 11.5 - 15.5 %   Platelets 212 150 - 400 K/uL   nRBC 0.0 0.0 - 0.2 %   Neutrophils Relative % 61 %   Neutro Abs 4.8 1.7 - 7.7 K/uL   Lymphocytes Relative 25 %   Lymphs Abs 2.0 0.7 - 4.0 K/uL   Monocytes Relative 10 %   Monocytes Absolute 0.8 0.1 - 1.0 K/uL   Eosinophils Relative 3 %   Eosinophils Absolute 0.2 0.0  - 0.5 K/uL   Basophils Relative 1 %   Basophils Absolute 0.1 0.0 - 0.1 K/uL   Immature Granulocytes 0 %   Abs Immature Granulocytes 0.03 0.00 - 0.07 K/uL  Comprehensive metabolic panel  Result Value Ref Range   Sodium 139 135 - 145 mmol/L   Potassium 4.3 3.5 - 5.1 mmol/L   Chloride 104 98 - 111 mmol/L   CO2 23 22 - 32 mmol/L   Glucose, Bld 92 70 - 99 mg/dL   BUN 23 8 - 23 mg/dL   Creatinine, Ser 0.82 0.44 - 1.00 mg/dL   Calcium 9.6 8.9 - 10.3 mg/dL   Total Protein 7.2 6.5 - 8.1 g/dL   Albumin 4.0 3.5 - 5.0 g/dL   AST 19 15 - 41 U/L   ALT 14 0 - 44 U/L   Alkaline Phosphatase 76 38 - 126 U/L   Total Bilirubin 0.5 0.3 - 1.2 mg/dL   GFR calc non Af Amer >  60 >60 mL/min   GFR calc Af Amer >60 >60 mL/min   Anion gap 12 5 - 15  Lipase, blood  Result Value Ref Range   Lipase 39 11 - 51 U/L   CT ABDOMEN PELVIS W CONTRAST  Result Date: 06/08/2019 CLINICAL DATA:  Lower abdominal pain and constipation. EXAM: CT ABDOMEN AND PELVIS WITH CONTRAST TECHNIQUE: Multidetector CT imaging of the abdomen and pelvis was performed using the standard protocol following bolus administration of intravenous contrast. CONTRAST:  OMNIPAQUE IOHEXOL 300 MG/ML  SOLN COMPARISON:  March 24, 2009 FINDINGS: Lower chest: No acute abnormality. Hepatobiliary: No focal liver abnormality is seen. Status post cholecystectomy. No biliary dilatation. Pancreas: Unremarkable. No pancreatic ductal dilatation or surrounding inflammatory changes. Spleen: Normal in size without focal abnormality. Adrenals/Urinary Tract: Adrenal glands are unremarkable. Kidneys are normal, without renal calculi, focal lesion, or hydronephrosis. Approximately 2.4 cm x 0.9 cm focal posterior urinary bladder wall thickening is seen. Stomach/Bowel: Stomach is within normal limits. The appendix is not identified. No evidence of bowel wall thickening, distention, or inflammatory changes. A marked amount of stool is seen throughout the large bowel.  Noninflamed diverticula are seen throughout the sigmoid colon. Vascular/Lymphatic: No significant vascular findings are present. No enlarged abdominal or pelvic lymph nodes. Reproductive: Uterus and bilateral adnexa are unremarkable. Other: No abdominal wall hernia or abnormality. No abdominopelvic ascites. Musculoskeletal: Multilevel degenerative changes seen throughout the lumbar spine. IMPRESSION: 1. Focal posterior urinary bladder wall thickening. While this may represent focal cystitis, correlation with urinalysis is recommended to exclude the presence of an underlying neoplastic process. 2. Noninflamed sigmoid diverticulosis. 3. Evidence of prior cholecystectomy. Electronically Signed   By: Aram Candela M.D.   On: 06/08/2019 22:35    Procedures  MDM  Disimpacted by prior provider with significant improvement in sxs.  Plan @ change of shift is to follow up on labs & CT imaging, if no acute/significant abnormalities discharge home with instructions prepared by prior provider.   CBC: No anemia or leukocytosis CMP: LFTs and renal function preserved.  No electrolyte derangement. Lipase: WNL. CT abdomen/pelvis:1. Focal posterior urinary bladder wall thickening. While this may represent focal cystitis, correlation with urinalysis is recommended to exclude the presence of an underlying neoplastic process. 2. Noninflamed sigmoid diverticulosis. 3. Evidence of prior cholecystectomy   Patient pending urinalysis.  00:10: Patient care signed out to [facility] PA-C at change of shift pending UA.      Cherly Anderson, PA-C 06/09/19 0011    Cherly Anderson, PA-C 06/09/19 Eligha Bridegroom, MD 06/09/19 2208

## 2019-06-09 NOTE — ED Notes (Signed)
Pt lying in bed awake. NAD noted. Pt ready for d/c 

## 2019-06-09 NOTE — ED Notes (Signed)
Pt up walking to the bathroom. NAD noted. Will continue to monitor until PTAR comes.

## 2019-06-11 LAB — URINE CULTURE: Culture: >=100000 — AB

## 2019-06-12 ENCOUNTER — Telehealth: Payer: Self-pay | Admitting: Emergency Medicine

## 2019-06-12 NOTE — Progress Notes (Signed)
ED Antimicrobial Stewardship Positive Culture Follow Up   Lynn Price is an 79 y.o. female who presented to [facility] on 06/08/2019 with a chief complaint of  Chief Complaint  Patient presents with  . Constipation    Recent Results (from the past 720 hour(s))  Urine culture     Status: Abnormal   Collection Time: 06/08/19 11:57 PM   Specimen: Urine, Random  Result Value Ref Range Status   Specimen Description   Final    URINE, RANDOM Performed at [facility], [address], [place], [place] [address]    Special Requests   Final    NONE Performed at [facility], [address], [place], [place] [address]    Culture >=100,000 COLONIES/mL KLEBSIELLA PNEUMONIAE (A)  Final   Report Status 06/11/2019 FINAL  Final   Organism ID, Bacteria KLEBSIELLA PNEUMONIAE (A)  Final      Susceptibility   Klebsiella pneumoniae - MIC*    AMPICILLIN RESISTANT Resistant     CEFAZOLIN <=4 SENSITIVE Sensitive     CEFTRIAXONE <=0.25 SENSITIVE Sensitive     CIPROFLOXACIN <=0.25 SENSITIVE Sensitive     GENTAMICIN <=1 SENSITIVE Sensitive     IMIPENEM <=0.25 SENSITIVE Sensitive     NITROFURANTOIN 32 SENSITIVE Sensitive     TRIMETH/SULFA <=20 SENSITIVE Sensitive     AMPICILLIN/SULBACTAM <=2 SENSITIVE Sensitive     PIP/TAZO <=4 SENSITIVE Sensitive     * >=100,000 COLONIES/mL KLEBSIELLA PNEUMONIAE    CT suggestive of cystitis but patient did not complain of any urinary symptoms at the time. No abx at discharge.  Plan: call patient to perform symptom check  If any new or worsening dysuria, frequency, urgency, lower pelvic, or lower back pain, start Keflex 500 mg PO bid x 5 days (#10)  For any fevers, flank pain, or other concerns, patient should return to ED   ED Provider: Derwood Kaplan, MD   [facility], Gerson Fauth A 06/12/2019, 11:44 AM Clinical Pharmacist [phone]

## 2019-06-12 NOTE — Telephone Encounter (Signed)
Post ED Visit - Positive Culture Follow-up: Successful Patient Follow-Up  Culture assessed and recommendations reviewed by:  []  , Pharm.D. []  Enzo Bi, Pharm.D., BCPS AQ-ID []  , Pharm.D., BCPS []  Celedonio Miyamoto, Pharm.D., BCPS []  [place], Garvin Fila.D., BCPS, AAHIVP []  , Pharm.D., BCPS, AAHIVP []  Georgina Pillion, PharmD, BCPS []  , PharmD, BCPS []  [place], PharmD, BCPS []  [address], PharmD PharmD  Positive urine culture  [x]  Patient discharged without antimicrobial prescription and treatment is now indicated []  Organism is resistant to prescribed ED discharge antimicrobial []  Patient with positive blood cultures  Changes discussed with ED provider: Dr Estella Husk New antibiotic prescription symptom check, if new urinary symptoms start keflex 500mg  po bid x 5 days  Attempting to contact patient    06/12/2019, 12:10 PM

## 2019-06-27 ENCOUNTER — Telehealth: Payer: Self-pay | Admitting: *Deleted

## 2019-06-27 NOTE — Telephone Encounter (Signed)
Contacted by by patient in response to letter sent to address on file.  States no urinary symptoms and no further follow up is needed.

## 2019-06-28 ENCOUNTER — Other Ambulatory Visit: Payer: Self-pay | Admitting: Neurology

## 2019-07-05 ENCOUNTER — Other Ambulatory Visit: Payer: Self-pay | Admitting: Neurology

## 2019-07-10 NOTE — Progress Notes (Signed)
Virtual Sleep Medicine Consult via Video Note   This visit type was conducted due to national recommendations for restrictions regarding the COVID-19 Pandemic (e.g. social distancing) in an effort to limit this patient's exposure and mitigate transmission in our community.  Due to her co-morbid illnesses, this patient is at least at moderate risk for complications without adequate follow up.  This format is felt to be most appropriate for this patient at this time.  All issues noted in this document were discussed and addressed.  A limited physical exam was performed with this format.  Please refer to the patient's chart for her consent to telehealth for [facility].   Evaluation Performed:  Sleep medicine consult  This visit type was conducted due to national recommendations for restrictions regarding the COVID-19 Pandemic (e.g. social distancing).  This format is felt to be most appropriate for this patient at this time.  All issues noted in this document were discussed and addressed.  No physical exam was performed (except for noted visual exam findings with Video Visits).  Please refer to the patient's chart (MyChart message for video visits and phone note for telephone visits) for the patient's consent to telehealth for [facility].  Date:  07/11/2019   ID:  Lynn Price, DOB 1941-02-05, MRN [id number]  Patient Location:  Home  Provider location:   [place]  PCP:  Gaynelle Arabian, MD  Sleep Medicine:  Fransico Him, MD Electrophysiologist:  None   Chief Complaint:  OSA  History of Present Illness:    Lynn Price is a 79 y.o. female who presents via audio/video conferencing for a telehealth visit today in referral from Gaynelle Arabian, MD to get reestablished with sleep medicine care.    Lynn Price is a 79 y.o. female with a history of OSA and HTN. She has a history of mild to moderate OSA with an AHI of 14.6/hr and had been on CPAP at 5cm H2O.  I have not seen  her in over 3 years and is referred back by her PCP.  She stopped using her CPAP a while ago but she does not remember how long ago it was. She says that it was hard to put it on and getting it adjusted so she stopped using it. She feels rested when she gets up in the am but does nap some during the day.  She denies any issues with waking from snoring or gasping for breath.  She denies any Am HAs.  She sleeps well at night.    The patient does not have symptoms concerning for COVID-19 infection (fever, chills, cough, or new shortness of breath).   Prior CV studies:   The following studies were reviewed today:  none  Past Medical History:  Diagnosis Date  . Anxiety   . Fibromyalgia   . GERD (gastroesophageal reflux disease)   . High blood pressure   . HSV (herpes simplex virus) anogenital infection   . IBS (irritable bowel syndrome)   . Memory loss   . Neuropathy   . Obesity (BMI 30-39.9) 04/01/2014  . Osteoarthritis   . Overactive bladder   . Restless leg syndrome   . Sleep apnea    Past Surgical History:  Procedure Laterality Date  . APPENDECTOMY    . CATARACT EXTRACTION Left   . CESAREAN SECTION    . CHOLECYSTECTOMY    . colonscopy    . MULTIPLE TOOTH EXTRACTIONS    . ROTATOR CUFF REPAIR Left   .  TOTAL KNEE ARTHROPLASTY       Current Meds  Medication Sig  . acetaminophen (TYLENOL) 325 MG tablet Take 325 mg by mouth See admin instructions. 325 mg twice a day and can take every 6 hours as needed  . acyclovir (ZOVIRAX) 400 MG tablet Take 400 mg by mouth 5 (five) times daily.  Marland Kitchen diltiazem (CARDIZEM CD) 120 MG 24 hr capsule Take 120 mg by mouth daily.  Marland Kitchen donepezil (ARICEPT) 5 MG tablet Take 1 tablet (5 mg total) by mouth at bedtime. An appointment is needed for further refills. Call [phone].  . gabapentin (NEURONTIN) 300 MG capsule Take 1 capsule by mouth at bedtime only.  Marland Kitchen LORazepam (ATIVAN) 1 MG tablet Take 1 mg by mouth 2 (two) times daily as needed for anxiety.  .  Omega-3 Fatty Acids (FISH OIL) 1200 MG CAPS Take 1,200 mg by mouth daily.   . polyethylene glycol (MIRALAX) 17 g packet Take 17 g by mouth daily. Dissolve one cap full in solution (water, gatorade, etc.) and administer once cap-full daily. You may titrate up daily by 1 cap-full until the patient is having pudding consistency of stools. After the patient is able to start passing softer stools they will need to be on 1/2 cap-full daily for 2 weeks.  Marland Kitchen QUEtiapine (SEROQUEL) 25 MG tablet Take 25 mg by mouth 2 (two) times daily.      Allergies:   Erythromycin, Penicillins, Sulfa antibiotics, and Tetracyclines & related   Social History   Tobacco Use  . Smoking status: Never Smoker  . Smokeless tobacco: Never Used  Substance Use Topics  . Alcohol use: Not Currently    Comment: wine occas  . Drug use: No     Family Hx: The patient's family history includes Depression in her father and mother; Mental illness in her sister and sister.  ROS:   Please see the history of present illness.     All other systems reviewed and are negative.   Labs/Other Tests and Data Reviewed:    Recent Labs: 06/08/2019: ALT 14; BUN 23; Creatinine, Ser 0.82; Hemoglobin 12.2; Platelets 212; Potassium 4.3; Sodium 139   Recent Lipid Panel No results found for: CHOL, TRIG, HDL, CHOLHDL, LDLCALC, LDLDIRECT  Wt Readings from Last 3 Encounters:  07/11/19 128 lb (58.1 kg)  06/08/19 135 lb (61.2 kg)  04/13/19 133 lb (60.3 kg)     Objective:    Vital Signs:  Ht 5\' 5"  (1.651 m)   Wt 128 lb (58.1 kg)   BMI 21.30 kg/m     ASSESSMENT & PLAN:    1.  OSA  - she was dx with mild to moderate OSA with an AHI of 14.6/hr and had been on CPAP at 5cm H2O in the past but stopped using her device. -she really has no problems with excessive daytime sleepiness -I will get a home sleep study to see how much OSA she has and if very mild then would not treat due to lack of symptoms.   2.  HTN -continue Cardizem CD 120mg   daily  Patient Risk:   After full review of this patient's clinical status, I feel that they are at least moderate risk at this time.  Time:   Today, I have spent 20 minutes on telemedicine discussing medical problems including OSA, HTN and reviewing patient's chart including PAP compliance download.  Medication Adjustments/Labs and Tests Ordered: Current medicines are reviewed at length with the patient today.  Concerns regarding medicines are outlined above.  Tests Ordered: No orders of the defined types were placed in this encounter.  Medication Changes: No orders of the defined types were placed in this encounter.   Disposition:  Follow up PRN pending results of sleep study  Signed, Armanda Magic, MD  07/11/2019 10:12 AM    [facility]

## 2019-07-11 ENCOUNTER — Telehealth: Payer: Self-pay

## 2019-07-11 ENCOUNTER — Telehealth (INDEPENDENT_AMBULATORY_CARE_PROVIDER_SITE_OTHER): Payer: Medicare Other | Admitting: Cardiology

## 2019-07-11 ENCOUNTER — Encounter: Payer: Self-pay | Admitting: Cardiology

## 2019-07-11 VITALS — Ht 65.0 in | Wt 128.0 lb

## 2019-07-11 DIAGNOSIS — G4733 Obstructive sleep apnea (adult) (pediatric): Secondary | ICD-10-CM | POA: Diagnosis not present

## 2019-07-11 DIAGNOSIS — I10 Essential (primary) hypertension: Secondary | ICD-10-CM

## 2019-07-11 NOTE — Patient Instructions (Signed)
Medication Instructions:  Your physician recommends that you continue on your current medications as directed. Please refer to the Current Medication list given to you today.  *If you need a refill on your cardiac medications before your next appointment, please call your pharmacy*   Lab Work: None ordered  If you have labs (blood work) drawn today and your tests are completely normal, you will receive your results only by: Marland Kitchen MyChart Message (if you have MyChart) OR . A paper copy in the mail If you have any lab test that is abnormal or we need to change your treatment, we will call you to review the results.   Testing/Procedures: Your physician has recommended that you have a home sleep study. This test records several body functions during sleep, including: brain activity, eye movement, oxygen and carbon dioxide blood levels, heart rate and rhythm, breathing rate and rhythm, the flow of air through your mouth and nose, snoring, body muscle movements, and chest and belly movement.   Follow-Up: AS NEEDED  Other Instructions None

## 2019-07-11 NOTE — Telephone Encounter (Signed)
  Patient Consent for Virtual Visit         Lynn Price has provided verbal consent on 07/11/2019 for a virtual visit (video or telephone).   CONSENT FOR VIRTUAL VISIT FOR:  Lynn Price  By participating in this virtual visit I agree to the following:  I hereby voluntarily request, consent and authorize [facility] and its employed or contracted physicians, physician assistants, nurse practitioners or other licensed health care professionals (the Practitioner), to provide me with telemedicine health care services (the "Services") as deemed necessary by the treating Practitioner. I acknowledge and consent to receive the Services by the Practitioner via telemedicine. I understand that the telemedicine visit will involve communicating with the Practitioner through live audiovisual communication technology and the disclosure of certain medical information by electronic transmission. I acknowledge that I have been given the opportunity to request an in-person assessment or other available alternative prior to the telemedicine visit and am voluntarily participating in the telemedicine visit.  I understand that I have the right to withhold or withdraw my consent to the use of telemedicine in the course of my care at any time, without affecting my right to future care or treatment, and that the Practitioner or I may terminate the telemedicine visit at any time. I understand that I have the right to inspect all information obtained and/or recorded in the course of the telemedicine visit and may receive copies of available information for a reasonable fee.  I understand that some of the potential risks of receiving the Services via telemedicine include:  Marland Kitchen Delay or interruption in medical evaluation due to technological equipment failure or disruption; . Information transmitted may not be sufficient (e.g. poor resolution of images) to allow for appropriate medical decision making by the Practitioner;  and/or  . In rare instances, security protocols could fail, causing a breach of personal health information.  Furthermore, I acknowledge that it is my responsibility to provide information about my medical history, conditions and care that is complete and accurate to the best of my ability. I acknowledge that Practitioner's advice, recommendations, and/or decision may be based on factors not within their control, such as incomplete or inaccurate data provided by me or distortions of diagnostic images or specimens that may result from electronic transmissions. I understand that the practice of medicine is not an exact science and that Practitioner makes no warranties or guarantees regarding treatment outcomes. I acknowledge that a copy of this consent can be made available to me via my patient portal [facility] MyChart), or I can request a printed copy by calling the office of [facility].    I understand that my insurance will be billed for this visit.   I have read or had this consent read to me. . I understand the contents of this consent, which adequately explains the benefits and risks of the Services being provided via telemedicine.  . I have been provided ample opportunity to ask questions regarding this consent and the Services and have had my questions answered to my satisfaction. . I give my informed consent for the services to be provided through the use of telemedicine in my medical care

## 2019-07-11 NOTE — Addendum Note (Signed)
Addended by: Daleen Bo I on: 07/11/2019 10:37 AM   Modules accepted: Orders

## 2019-07-16 ENCOUNTER — Telehealth: Payer: Self-pay | Admitting: *Deleted

## 2019-07-16 NOTE — Telephone Encounter (Signed)
-----   Message from Lattie Haw, RN sent at 07/11/2019 10:38 AM EDT ----- Regarding: Home Sleep Study Per Dr. Mayford Knife, patient needs Home Sleep Study for OSA and HTN.   Thanks

## 2019-07-18 ENCOUNTER — Emergency Department (HOSPITAL_COMMUNITY): Payer: Medicare Other

## 2019-07-18 ENCOUNTER — Encounter (HOSPITAL_COMMUNITY): Payer: Self-pay

## 2019-07-18 ENCOUNTER — Emergency Department (HOSPITAL_COMMUNITY)
Admission: EM | Admit: 2019-07-18 | Discharge: 2019-07-18 | Disposition: A | Discharge status: Home / Self Care | Payer: Medicare Other | Attending: Emergency Medicine | Admitting: Emergency Medicine

## 2019-07-18 DIAGNOSIS — R110 Nausea: Secondary | ICD-10-CM | POA: Insufficient documentation

## 2019-07-18 DIAGNOSIS — E860 Dehydration: Secondary | ICD-10-CM

## 2019-07-18 DIAGNOSIS — R1011 Right upper quadrant pain: Secondary | ICD-10-CM | POA: Diagnosis not present

## 2019-07-18 LAB — BASIC METABOLIC PANEL
Anion gap: 9 (ref 5–15)
BUN: 21 mg/dL (ref 8–23)
CO2: 25 mmol/L (ref 22–32)
Calcium: 9 mg/dL (ref 8.9–10.3)
Chloride: 105 mmol/L (ref 98–111)
Creatinine, Ser: 0.82 mg/dL (ref 0.44–1.00)
GFR calc Af Amer: >60 mL/min (ref >60)
GFR calc non Af Amer: >60 mL/min (ref >60)
Glucose, Bld: 88 mg/dL (ref 70–99)
Potassium: 3.8 mmol/L (ref 3.5–5.1)
Sodium: 139 mmol/L (ref 135–145)

## 2019-07-18 LAB — CBC
HCT: 38.7 % (ref 36.0–46.0)
Hemoglobin: 12.6 g/dL (ref 12.0–15.0)
MCH: 30.9 pg (ref 26.0–34.0)
MCHC: 32.6 g/dL (ref 30.0–36.0)
MCV: 94.9 fL (ref 80.0–100.0)
Platelets: 190 10*3/uL (ref 150–400)
RBC: 4.08 MIL/uL (ref 3.87–5.11)
RDW: 13.2 % (ref 11.5–15.5)
WBC: 7.5 10*3/uL (ref 4.0–10.5)
nRBC: 0 % (ref 0.0–0.2)

## 2019-07-18 LAB — URINALYSIS, ROUTINE W REFLEX MICROSCOPIC
Bilirubin Urine: NEGATIVE
Glucose, UA: NEGATIVE mg/dL
Hgb urine dipstick: NEGATIVE
Ketones, ur: NEGATIVE mg/dL
Leukocytes,Ua: NEGATIVE
Nitrite: NEGATIVE
Protein, ur: NEGATIVE mg/dL
Specific Gravity, Urine: 1.01 (ref 1.005–1.030)
pH: 7 (ref 5.0–8.0)

## 2019-07-18 LAB — CBG MONITORING, ED: Glucose-Capillary: 74 mg/dL (ref 70–99)

## 2019-07-18 MED ORDER — SODIUM CHLORIDE 0.9 % IV BOLUS
1000.0000 mL | Freq: Once | INTRAVENOUS | Status: AC
  Administered 2019-07-18: 1000 mL via INTRAVENOUS

## 2019-07-18 MED ORDER — SODIUM CHLORIDE 0.9% FLUSH
3.0000 mL | Freq: Once | INTRAVENOUS | Status: AC
  Administered 2019-07-18: 3 mL via INTRAVENOUS

## 2019-07-18 NOTE — Progress Notes (Signed)
PT Cancellation Note  Patient Details Name: Lynn Price MRN: [id number] DOB: [birth year]   Cancelled Treatment:    Reason Eval/Treat Not Completed: PT screened, no needs identified, will sign off, noted ambulated with RN, now PTAR picking pt up.   Rada Hay 07/18/2019, 2:37 PM  Blanchard Kelch PT Acute Rehabilitation Services Pager [phone] Office [phone]

## 2019-07-18 NOTE — ED Notes (Signed)
PTAR has been dispatched. ETA- couple hours or more.  

## 2019-07-18 NOTE — TOC Initial Note (Addendum)
Transition of Care [facility]) - Initial/Assessment Note    Patient Details  Name: Lynn Price MRN: [id number] Date of Birth: 1940-07-01  Transition of [facility]) CM/SW Contact:    Erenest Rasher, RN Phone Number: [phone]  07/18/2019, 3:37 PM  Clinical Narrative:                 TOC CM spoke to pt at bedside. States she lives at [facility] fax [phone] or [phone] and her husband is at the ALF. She declines SNF. Pt is requesting RW with seat. Spoke to pt's sister and states she has a RW she can borrow until they deliver her RW to facility. She will go over to stay with pt for few days until her PT gets started. Referral sent to [facility] to review. Contacted facility and spoke to [place]. They use [facility]. She will check her husband room to see if there is a RW in there for pt to use. Explained to pt the importance of using an assistive device to ambulate. [place] for RW with seat to be delivered to facility.   4 pm received confirmation from [facility] and they accepted referral. Received confirmation from [facility] and they will deliver RW with seat to facility.   Expected Discharge Plan: [place] Barriers to Discharge: No Barriers Identified   Patient Goals and CMS Choice Patient states their goals for this hospitalization and ongoing recovery are:: [place] at the ALF there CMS Medicare.gov Compare Post Acute Care list provided to:: Patient Choice offered to / list presented to : Patient  Expected Discharge Plan and Services Expected Discharge Plan: [place] In-house Referral: Clinical Social Work Discharge Planning Services: CM Consult Post Acute Care Choice: [place] arrangements for the past 2 months: [place]                 DME Arranged: Walker rolling with seat DME Agency: [employer] Date DME Agency Contacted: 07/18/19 Time  DME Agency Contacted: [phone] Representative spoke with at DME Agency: Dawayne Patricia            Prior Living Arrangements/Services Living arrangements for the past 2 months: [place] Lives with:: Facility Resident Patient language and need for interpreter reviewed:: Yes Do you feel safe going back to the place where you live?: Yes      Need for Family Participation in Patient Care: Yes (Comment) Care giver support system in place?: Yes (comment)   Criminal Activity/Legal Involvement Pertinent to Current Situation/Hospitalization: No - Comment as needed  Activities of Daily Living      Permission Sought/Granted Permission sought to share information with : Case Manager, Facility Sport and exercise psychologist, PCP Permission granted to share information with : Yes, Verbal Permission Granted  Share Information with NAME: Altha Harm Crotts  Permission granted to share info w AGENCY: HH, DME agency  Permission granted to share info w Relationship: sister  Permission granted to share info w Contact Information: [phone]  Emotional Assessment Appearance:: Appears stated age Attitude/Demeanor/Rapport: Gracious Affect (typically observed): Accepting Orientation: : Oriented to Self, Oriented to Place, Oriented to  Time, Oriented to Situation   Psych Involvement: No (comment)  Admission diagnosis:  generalized weakness Patient Active Problem List   Diagnosis Date Noted  . Nausea & vomiting 12/16/2017  . Diarrhea 12/16/2017  . Hypokalemia 12/16/2017  .  Gait abnormality 08/30/2017  . Lumbar radiculopathy 08/30/2017  . Chronic left-sided low back pain with left-sided sciatica 07/21/2017  . Personal history of calcium pyrophosphate deposition disease (CPPD) 07/29/2016  . History of peripheral neuropathy 07/29/2016  . History of total knee replacement, right 07/22/2016  . Pseudogout 01/22/2016  . Primary osteoarthritis of both knees 01/22/2016  . Primary osteoarthritis of  both hands 01/22/2016  . Primary osteoarthritis of both feet 01/22/2016  . Fibromyalgia 01/22/2016  . Primary insomnia 01/22/2016  . Obesity (BMI 30-39.9) 04/01/2014  . Obstructive sleep apnea 04/19/2013  . Essential hypertension, benign 04/19/2013  . Memory loss 08/02/2012   PCP:  Blair Heys, MD Pharmacy:   [facility] - [place], [place] - [address] [facility] [address] [place] [address] [place] [address] Phone: [phone] Fax: [phone]  [facility] - [place], [place] - [place] [address] [place] [address] Phone: [phone] Fax: [phone]     Social Determinants of Health (SDOH) Interventions    Readmission Risk Interventions No flowsheet data found.

## 2019-07-18 NOTE — ED Notes (Addendum)
Pt ambulatory to restroom and back with no assistance. Patient was a little wobbly back to room. Patient did not require assistance and stated it was the socks that made her off balance.

## 2019-07-18 NOTE — ED Provider Notes (Signed)
[facility]-EMERGENCY DEPT [facility] Emergency Department Provider Note MRN:  [id number]  Arrival date & time: 07/18/19     Chief Complaint   Weakness   History of Present Illness   Lynn Price is a 79 y.o. year-old female with a history of fibromyalgia presenting to the ED with chief complaint of weakness.  Generalized weakness for several days, patient feeling lightheaded when standing for the past few days.  Endorsing some mild burning with urination.  Denies fever.  No headache or vision change, no chest pain or shortness of breath, no abdominal pain.  No numbness or weakness to the arms or legs.  Review of Systems  A complete 10 system review of systems was obtained and all systems are negative except as noted in the HPI and PMH.   Patient's Health History    Past Medical History:  Diagnosis Date  . Anxiety   . Fibromyalgia   . GERD (gastroesophageal reflux disease)   . High blood pressure   . HSV (herpes simplex virus) anogenital infection   . IBS (irritable bowel syndrome)   . Memory loss   . Neuropathy   . Obesity (BMI 30-39.9) 04/01/2014  . Osteoarthritis   . Overactive bladder   . Restless leg syndrome   . Sleep apnea     Past Surgical History:  Procedure Laterality Date  . APPENDECTOMY    . CATARACT EXTRACTION Left   . CESAREAN SECTION    . CHOLECYSTECTOMY    . colonscopy    . MULTIPLE TOOTH EXTRACTIONS    . ROTATOR CUFF REPAIR Left   . TOTAL KNEE ARTHROPLASTY      Family History  Problem Relation Age of Onset  . Depression Mother   . Depression Father   . Mental illness Sister   . Mental illness Sister     Social History   Socioeconomic History  . Marital status: Married    Spouse name: Dimas Aguas  . Number of children: 2  . Years of education: college  . Highest education level: Not on file  Occupational History    Comment: retired  Tobacco Use  . Smoking status: Never Smoker  . Smokeless tobacco: Never Used  Substance and Sexual Activity    . Alcohol use: Not Currently    Comment: wine occas  . Drug use: No  . Sexual activity: Not on file  Other Topics Concern  . Not on file  Social History Narrative   Patient lives at home with her husband Dimas Aguas) and she is retired. Patient has 2 years of college. Patient has 2 children.    Social Determinants of Health   Financial Resource Strain:   . Difficulty of Paying Living Expenses:   Food Insecurity:   . Worried About Programme researcher, broadcasting/film/video in the Last Year:   . Barista in the Last Year:   Transportation Needs:   . Freight forwarder (Medical):   [place] Lack of Transportation (Non-Medical):   Physical Activity:   . Days of Exercise per Week:   . Minutes of Exercise per Session:   Stress:   . Feeling of Stress :   Social Connections:   . Frequency of Communication with Friends and Family:   . Frequency of Social Gatherings with Friends and Family:   . Attends Religious Services:   . Active Member of Clubs or Organizations:   . Attends Banker Meetings:   [place] Marital Status:   Intimate Partner Violence:   .  Fear of Current or Ex-Partner:   . Emotionally Abused:   [place] Physically Abused:   . Sexually Abused:      Physical Exam   Vitals:   07/18/19 1245 07/18/19 1300  BP:  (!) 156/87  Pulse: 70 76  Resp: 13 20  Temp:    SpO2: 98% 98%    CONSTITUTIONAL: Chronically ill-appearing, NAD NEURO:  Alert and oriented x 3, no focal deficits EYES:  eyes equal and reactive ENT/NECK:  no LAD, no JVD CARDIO: Regular rate, well-perfused, normal S1 and S2 PULM:  CTAB no wheezing or rhonchi GI/GU:  normal bowel sounds, non-distended, non-tender MSK/SPINE:  No gross deformities, no edema SKIN:  no rash, atraumatic PSYCH:  Appropriate speech and behavior  *Additional and/or pertinent findings included in MDM below  Diagnostic and Interventional Summary    EKG Interpretation  Date/Time:  Thursday Jul 18 2019 10:51:40 EDT Ventricular Rate:  65 PR  Interval:    QRS Duration: 104 QT Interval:  418 QTC Calculation: 435 R Axis:   -35 Text Interpretation: Sinus rhythm Left axis deviation Low voltage, precordial leads RSR' in V1 or V2, right VCD or RVH Confirmed by Kennis Carina [phone]) on 07/18/2019 11:53:32 AM      Labs Reviewed  URINALYSIS, ROUTINE W REFLEX MICROSCOPIC - Abnormal; Notable for the following components:      Result Value   Color, Urine STRAW (*)    All other components within normal limits  BASIC METABOLIC PANEL  CBC  CBG MONITORING, ED    DG Chest Port 1 View  Final Result      Medications  sodium chloride flush (NS) 0.9 % injection 3 mL (3 mLs Intravenous Given 07/18/19 1057)  sodium chloride 0.9 % bolus 1,000 mL (0 mLs Intravenous Stopped 07/18/19 1218)     Procedures  /  Critical Care Procedures  ED Course and Medical Decision Making  I have reviewed the triage vital signs, the nursing notes, and pertinent available records from the EMR.  Listed above are laboratory and imaging tests that I personally ordered, reviewed, and interpreted and then considered in my medical decision making (see below for details).      Malaise and weakness in this 79 year old female coming from independent living, she is oriented to name place and time, benign abdomen, reassuring vital signs.  Per chart review she has recently had a Klebsiella pneumonia UTI, unsure if she received antibiotics for this.  Awaiting labs, urine, chest x-ray and will reassess.  Work-up is very reassuring, patient continues to have normal vital signs, after IV fluids she is able to ambulate well, with minimal lightheadedness.  I think she would benefit from a walker just for further stability to prevent falls at home.  Social work consulted to facilitate this, will be delivered to her facility.  Symptoms are not consistent with vertigo, normal neurological exam, nothing to warrant further imaging.  Appropriate for discharge.  Elmer Sow. Pilar Plate,  MD [facility] Emergency Medicine [facility] [email or web address]  Final Clinical Impressions(s) / ED Diagnoses     ICD-10-CM   1. Dehydration  E86.0     ED Discharge Orders    None       Discharge Instructions Discussed with and Provided to Patient:     Discharge Instructions     You were evaluated in the Emergency Department and after careful evaluation, we did not find any emergent condition requiring admission or further testing in the hospital.  Your exam/testing today was  overall reassuring.  It is important that you increase your fluid intake at home, we think this will help with your symptoms.  We also recommend that you use the walker to prevent falls.  Please return to the Emergency Department if you experience any worsening of your condition.  We encourage you to follow up with a primary care provider.  Thank you for allowing [place] to be a part of your care.        Maudie Flakes, MD 07/18/19 1352

## 2019-07-18 NOTE — Discharge Instructions (Addendum)
You were evaluated in the Emergency Department and after careful evaluation, we did not find any emergent condition requiring admission or further testing in the hospital.  Your exam/testing today was overall reassuring.  It is important that you increase your fluid intake at home, we think this will help with your symptoms.  We also recommend that you use the walker to prevent falls.  Please return to the Emergency Department if you experience any worsening of your condition.  We encourage you to follow up with a primary care provider.  Thank you for allowing [place] to be a part of your care.

## 2019-07-18 NOTE — ED Notes (Signed)
BP has trended up. Bero, MD made aware and ok with patients BP and going home.

## 2019-07-18 NOTE — ED Triage Notes (Addendum)
Patient arrived via [facility] from independently living  C/O generalized weakness since yesterday morning   Denies pain, shob, chest pain, urinary symptoms, and denies covid exposure.   Positive for orthostatic before IV fluids  130/80 laying flat Sat up 100/60  Pt reports a few weeks ago she was put back on diltiazem last week.   Patient reports she almost fell yesterday from being so week but did not fall.   A/Ox4 Lethargic with ems   EMS VS: 133/75 P-66 97.5 100% RA  EMS gave 500 NS  20g IV L forearm.   Her husband was recently moved to assisted living   From [address]

## 2019-07-25 ENCOUNTER — Telehealth: Payer: Self-pay | Admitting: *Deleted

## 2019-07-25 NOTE — Telephone Encounter (Signed)
Staff message sent to Nina ok to schedule HST no PA is required. 

## 2019-07-25 NOTE — Telephone Encounter (Signed)
-----   Message from Reesa Chew, CMA sent at 07/16/2019  3:59 PM EDT ----- Regarding: precert  ----- Message ----- From: Lattie Haw, RN Sent: 07/11/2019  10:38 AM EDT To: Reesa Chew, CMA, Theresia Majors, RN Subject: Home Sleep Study                               Per Dr. Mayford Knife, patient needs Home Sleep Study for OSA and HTN.   Thanks

## 2019-07-31 NOTE — Telephone Encounter (Signed)
Patient is aware and agreeable to Home Sleep Study through [facility]. Patient is scheduled for 8/5  at 10 am to pick up home sleep kit and meet with Respiratory therapist at [facility]. Patient is aware that if this appointment date and time does not work for them they should contact Artis Delay directly at [phone]. Patient is aware that a sleep packet will be sent from [facility] in week. Patient is agreeable to treatment and thankful for call.

## 2019-08-01 NOTE — Telephone Encounter (Signed)
Patient is living at [address]  [place] [address].patient will go to pick up her test on this day.

## 2019-08-03 ENCOUNTER — Other Ambulatory Visit: Payer: Self-pay | Admitting: Neurology

## 2019-09-04 ENCOUNTER — Other Ambulatory Visit: Payer: Self-pay | Admitting: Neurology

## 2019-09-09 ENCOUNTER — Other Ambulatory Visit: Payer: Self-pay | Admitting: Neurology

## 2019-09-19 ENCOUNTER — Other Ambulatory Visit: Payer: Self-pay

## 2019-09-19 ENCOUNTER — Encounter: Payer: Self-pay | Admitting: Neurology

## 2019-09-19 ENCOUNTER — Ambulatory Visit: Payer: Medicare Other | Admitting: Neurology

## 2019-09-19 VITALS — BP 126/80 | HR 73 | Ht 65.0 in | Wt 119.0 lb

## 2019-09-19 DIAGNOSIS — R413 Other amnesia: Secondary | ICD-10-CM

## 2019-09-19 MED ORDER — DONEPEZIL HCL 5 MG PO TABS: 5.0000 mg | ORAL_TABLET | Freq: Every day | ORAL | 3 refills | Status: DC

## 2019-09-19 NOTE — Progress Notes (Signed)
[name] DOB: 1940-08-11  REASON FOR VISIT: follow up HISTORY FROM: patient  HISTORY OF PRESENT ILLNESS: Today 09/19/19  HISTORY  Lynn Price, 79 year old female was referred by her primary care physician Dr. Blair Price for evaluation of memory trouble. She was last seen in June 2015 by Lynn Price for memory loss  She has a history of fibromyalgia hypertension, anxiety, obstructive sleep apnea using CPAP and short-term memory trouble. She also has a history of bilateral feet paresthesias. 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 She denies significant family history of dementia   MRI of the brain 07/11/2011 shows mild changes of chronic microvascular ischemia and generalized cerebral atrophy.   The patient continues to drive and has gotten lost in unfamiliar surroundings. She continues to be independent with her activities of daily living. She plays golf intermittently otherwise she gets no regular exercise. She has not had any falls, long-standing history of frequency incontinence and on oxybutynin. Her Mini-Mental Status exam is stable. She returns for reevaluation  UPDATE June 6th 2016. She is with her husband at today's clinical visit, she continue has mild slow worsening memory trouble, she continued to drive short distances in a familiar route, she visit her friends, go to church regularly, able to clean house, independent at daily activity,  She also complains of bilateral feet numbness tingling, slow worsening, she is taking gabapentin 300 mg 2 tablets in the morning 3 tablets every night, does make her feel tired and sleepy,  She has not used her CPAP machine for a while, tends to go to bed late, take long nap in the afternoon,  She also complains of being fatigued, lack of energy  UPDATE August 30 2017:  She is accompanied by her husbandat today's clinical visit, she has been followed by our  office regularly for memory loss, is referred back by her primary care physician Lynn Price for evaluation of worsening low back pain, gait abnormality,  She had history of chronic low back pain, gradually getting worse, radiating pain to left lower extremity, bilateral leg numbness, weakness, left worse than right, has tripped and fell few times, is receiving epidural injection which has helped her low back pain, she also complains of occasional knee bowel and bladder incontinence, frequent urinary urgency.  Personally reviewed MRI of lumbar in June 23, 2017, there is evidence of multilevel degenerative changes, progression of right-sided disc protrusion L4-5 with expected impingement of right L5 nerve roots, diffuse endplate spurring, right greater than left L5-S1, are exchanged with subarticular foraminal stenosis, right greater than left  Update 09/18/2018 SS: Lynn Price is a 79 year old female with history of memory disturbance and bilateral lumbar radiculopathy. She presents today for follow-up accompanied by her granddaughter.  She indicates her memory is fair.  She lives with her husband, she is able to perform her ADLs, manages cooking, finances with her husband.  She has a good appetite. She manages her medications in a weekly pillbox. Occasionally she may miss a dose of medicine.  She is able to drive short distances only.  She recently had eye surgery.  She does not have any family nearby, but her family rotates in and out to visit.  Unfortunately, her husband has developed memory trouble and his is more progressive.  She is having to care for him.  At times she reports she may hear a man on the porch, but knows he is not here.  She  does follow with Lynn Price for chronic back pain, she says she completed physical therapy earlier this year and it was beneficial.  She indicates that her chronic low back pain has improved, she reports some weakness in her legs.  She denies bowel or  bladder incontinence.  She has not had an epidural injection in a while, she uses max freeze on her back.  She is considering assisted living for her and her husband.  Update September 19, 2019 SS: Here today alone, "family falling apart", son got sick-had surgery [place], husband is in memory care. She lives in Independent Living at "[place]" apartment. Is still driving, doing this well. Memory is stable, MMSE 30/30 today. Her health is up and down, ER visits for constipation, dehydration. Has psoriasis in scalp, seeing Dermatology. She manages her ADLs, household affairs, has a lot of paperwork to do related to finances, her sisters helps her with this. Hasn't been able to do much she enjoys (walking, playing games) because busy with stock papers. Takes Seroquel at night for sleep, sleeps well, denies hallucinations. Appetite is good. No falls recently. Has arthritis in her shoulders, doing well with aricept 5 mg, no side effects.  Here today unaccompanied.  REVIEW OF SYSTEMS: Out of a complete 14 system review of symptoms, the patient complains only of the following symptoms, and all other reviewed systems are negative.  Memory loss  ALLERGIES: Allergies  Allergen Reactions  . Erythromycin Rash  . Penicillins Rash    Remote reaction, no medical attention required No cutaneous or anaphylactic symptoms Tolerates Keflex  . Sulfa Antibiotics Rash  . Tetracyclines & Related Rash    HOME MEDICATIONS: Outpatient Medications Prior to Visit  Medication Sig Dispense Refill  . acetaminophen (TYLENOL) 325 MG tablet Take 325 mg by mouth See admin instructions. 325 mg twice a day and can take every 6 hours as needed    . acyclovir (ZOVIRAX) 400 MG tablet Take 400 mg by mouth 2 (two) times daily.     Marland Kitchen diltiazem (CARDIZEM CD) 120 MG 24 hr capsule Take 120 mg by mouth daily.    Marland Kitchen donepezil (ARICEPT) 5 MG tablet Take 1 tablet (5 mg total) by mouth at bedtime. Call [phone] to schedule appointment. Must be  seen for further refills. 30 tablet 0  . FLUOCINOLONE ACETONIDE SCALP EX Apply 1 application topically 3 (three) times a week.    . gabapentin (NEURONTIN) 300 MG capsule Take 1 capsule by mouth at bedtime only. (Patient taking differently: Take 300 mg by mouth at bedtime. ) 30 capsule 0  . LORazepam (ATIVAN) 1 MG tablet Take 1 mg by mouth 2 (two) times daily as needed for anxiety.    . Omega-3 Fatty Acids (FISH OIL) 1200 MG CAPS Take 1,200 mg by mouth daily.     . polyethylene glycol (MIRALAX) 17 g packet Take 17 g by mouth daily. Dissolve one cap full in solution (water, gatorade, etc.) and administer once cap-full daily. You may titrate up daily by 1 cap-full until the patient is having pudding consistency of stools. After the patient is able to start passing softer stools they will need to be on 1/2 cap-full daily for 2 weeks. (Patient taking differently: Take 17 g by mouth daily as needed for mild constipation. ) 14 each 0  . QUEtiapine (SEROQUEL) 25 MG tablet Take 25 mg by mouth at bedtime.   6   No facility-administered medications prior to visit.    PAST MEDICAL HISTORY: Past Medical History:  Diagnosis Date  . Anxiety   . Fibromyalgia   . GERD (gastroesophageal reflux disease)   . High blood pressure   . HSV (herpes simplex virus) anogenital infection   . IBS (irritable bowel syndrome)   . Memory loss   . Neuropathy   . Obesity (BMI 30-39.9) 04/01/2014  . Osteoarthritis   . Overactive bladder   . Restless leg syndrome   . Sleep apnea     PAST SURGICAL HISTORY: Past Surgical History:  Procedure Laterality Date  . APPENDECTOMY    . CATARACT EXTRACTION Left   . CESAREAN SECTION    . CHOLECYSTECTOMY    . colonscopy    . MULTIPLE TOOTH EXTRACTIONS    . ROTATOR Price REPAIR Left   . TOTAL KNEE ARTHROPLASTY      FAMILY HISTORY: Family History  Problem Relation Age of Onset  . Depression Mother   . Depression Father   . Mental illness Sister   . Mental illness Sister      SOCIAL HISTORY: Social History   Socioeconomic History  . Marital status: Married    Spouse name: Dimas Aguas  . Number of children: 2  . Years of education: college  . Highest education level: Not on file  Occupational History    Comment: retired  Tobacco Use  . Smoking status: Never Smoker  . Smokeless tobacco: Never Used  Vaping Use  . Vaping Use: Never used  Substance and Sexual Activity  . Alcohol use: Not Currently    Comment: wine occas  . Drug use: No  . Sexual activity: Not on file  Other Topics Concern  . Not on file  Social History Narrative   Patient lives at home with her husband Dimas Aguas) and she is retired. Patient has 2 years of college. Patient has 2 children.    Social Determinants of Health   Financial Resource Strain:   . Difficulty of Paying Living Expenses:   Food Insecurity:   . Worried About Programme researcher, broadcasting/film/video in the Last Year:   . Barista in the Last Year:   Transportation Needs:   . Freight forwarder (Medical):   Marland Kitchen Lack of Transportation (Non-Medical):   Physical Activity:   . Days of Exercise per Week:   . Minutes of Exercise per Session:   Stress:   . Feeling of Stress :   Social Connections:   . Frequency of Communication with Friends and Family:   . Frequency of Social Gatherings with Friends and Family:   . Attends Religious Services:   . Active Member of Clubs or Organizations:   . Attends Banker Meetings:   Marland Kitchen Marital Status:   Intimate Partner Violence:   . Fear of Current or Ex-Partner:   . Emotionally Abused:   Marland Kitchen Physically Abused:   . Sexually Abused:    PHYSICAL EXAM  Vitals:   09/19/19 0940  BP: 126/80  Pulse: 73  Weight: 119 lb (54 kg)  Height: 5\' 5"  (1.651 m)   Body mass index is 19.8 kg/m.  Generalized: Well developed, in no acute distress  MMSE - Mini Mental State Exam 09/19/2019 09/18/2018 05/30/2017  Orientation to time 5 1 5   Orientation to Place 5 5 4   Registration 3 0 3   Attention/ Calculation 5 5 3   Attention/Calculation-comments - - -  Recall 3 1 2   Language- name 2 objects 2 2 2   Language- repeat 1 1 1   Language- follow 3 step command 3  2 2  Language- read & follow direction 1 1 1   Write a sentence 1 1 0  Copy design 1 1 1   Copy design-comments - 8 animals named -  Total score 30 20 24     Neurological examination  Mentation: Alert oriented to time, place, history taking. Follows all commands speech and language fluent Cranial nerve II-XII: Pupils were equal round reactive to light. Extraocular movements were full, visual field were full on confrontational test. Facial sensation and strength were normal. Head turning and shoulder shrug  were normal and symmetric. Motor: The motor testing reveals 5 over 5 strength of all 4 extremities. Good symmetric motor tone is noted throughout.  Sensory: Sensory testing is intact to soft touch on all 4 extremities. No evidence of extinction is noted.  Coordination: Cerebellar testing reveals good finger-nose-finger and heel-to-shin bilaterally.  Gait and station: Gait is slightly wide-based, but steady. Reflexes: Deep tendon reflexes are symmetric and normal bilaterally.   DIAGNOSTIC DATA (LABS, IMAGING, TESTING) - I reviewed patient records, labs, notes, testing and imaging myself where available.  Lab Results  Component Value Date   WBC 7.5 07/18/2019   HGB 12.6 07/18/2019   HCT 38.7 07/18/2019   MCV 94.9 07/18/2019   PLT 190 07/18/2019      Component Value Date/Time   NA 139 07/18/2019 1056   K 3.8 07/18/2019 1056   CL 105 07/18/2019 1056   CO2 25 07/18/2019 1056   GLUCOSE 88 07/18/2019 1056   BUN 21 07/18/2019 1056   CREATININE 0.82 07/18/2019 1056   CALCIUM 9.0 07/18/2019 1056   PROT 7.2 06/08/2019 2033   ALBUMIN 4.0 06/08/2019 2033   AST 19 06/08/2019 2033   ALT 14 06/08/2019 2033   ALKPHOS 76 06/08/2019 2033   BILITOT 0.5 06/08/2019 2033   GFRNONAA >60 07/18/2019 1056   GFRAA >60  07/18/2019 1056   No results found for: CHOL, HDL, LDLCALC, LDLDIRECT, TRIG, CHOLHDL No results found for: ZOXW9UHGBA1C No results found for: VITAMINB12 No results found for: TSH    ASSESSMENT AND PLAN 79 y.o. year old female  has a past medical history of Anxiety, Fibromyalgia, GERD (gastroesophageal reflux disease), High blood pressure, HSV (herpes simplex virus) anogenital infection, IBS (irritable bowel syndrome), Memory loss, Neuropathy, Obesity (BMI 30-39.9) (04/01/2014), Osteoarthritis, Overactive bladder, Restless leg syndrome, and Sleep apnea. here with:  1.  Mild cognitive impairment -MMSE 30/30 today -Continue Aricept 5 mg at bedtime, will stay at current dose, previously had side effect of stomach upset, weight loss -Now living at independent living apartment -Also taking Seroquel 25 mg at bedtime from PCP, denies hallucinations, thinks it helps her sleep -Encouraged to get back to doing activities she enjoys   2.  Bilateral lumbar radiculopathy -No longer taking gabapentin, sleeping well, no falls -Will continue follow-up with PCP, follow-up here in 1 year or sooner if needed  I spent 30 minutes of face-to-face and non-face-to-face time with patient.  This included previsit chart review, lab review, study review, order entry, electronic health record documentation, patient education.  Margie EgeSarah Deasha Clendenin, AGNP-C, DNP 09/19/2019, 9:50 AM [facility] [address], [address] [place], [place] [address] [phone]

## 2019-09-19 NOTE — Patient Instructions (Signed)
Memory score is good today 30/30 Continue Aricept at current dosing See you back in 1 year

## 2019-09-24 ENCOUNTER — Other Ambulatory Visit: Payer: Self-pay | Admitting: Neurology

## 2019-09-25 ENCOUNTER — Other Ambulatory Visit: Payer: Self-pay | Admitting: Neurology

## 2019-09-25 NOTE — Telephone Encounter (Addendum)
Refilled one month to [facility]. Previous refill was sent to [facility], called them, spoke with [facility] and canceled that Rx. Vonna Drafts stated the 1st refill already shipped out on 7/23, but she canceled remaining refills. [facility] no further refills until she schedules FU.

## 2019-10-03 ENCOUNTER — Ambulatory Visit (HOSPITAL_BASED_OUTPATIENT_CLINIC_OR_DEPARTMENT_OTHER): Payer: Medicare Other | Attending: Cardiology | Admitting: Cardiology

## 2019-12-31 NOTE — Progress Notes (Signed)
I have reviewed and agreed above plan. 

## 2020-02-03 ENCOUNTER — Other Ambulatory Visit: Payer: Self-pay

## 2020-02-03 ENCOUNTER — Encounter (HOSPITAL_COMMUNITY): Payer: Self-pay

## 2020-02-03 ENCOUNTER — Emergency Department (HOSPITAL_COMMUNITY)
Admission: EM | Admit: 2020-02-03 | Discharge: 2020-02-03 | Disposition: A | Discharge status: Home / Self Care | Payer: Medicare Other | Attending: Emergency Medicine | Admitting: Emergency Medicine

## 2020-02-03 DIAGNOSIS — R55 Syncope and collapse: Secondary | ICD-10-CM | POA: Insufficient documentation

## 2020-02-03 DIAGNOSIS — R300 Dysuria: Secondary | ICD-10-CM | POA: Diagnosis not present

## 2020-02-03 DIAGNOSIS — N390 Urinary tract infection, site not specified: Secondary | ICD-10-CM

## 2020-02-03 DIAGNOSIS — Z96652 Presence of left artificial knee joint: Secondary | ICD-10-CM | POA: Insufficient documentation

## 2020-02-03 LAB — URINALYSIS, ROUTINE W REFLEX MICROSCOPIC
Bilirubin Urine: NEGATIVE
Glucose, UA: NEGATIVE mg/dL
Hgb urine dipstick: NEGATIVE
Ketones, ur: NEGATIVE mg/dL
Nitrite: NEGATIVE
Protein, ur: NEGATIVE mg/dL
Specific Gravity, Urine: 1.01 (ref 1.005–1.030)
pH: 7 (ref 5.0–8.0)

## 2020-02-03 LAB — CBC WITH DIFFERENTIAL/PLATELET
Abs Immature Granulocytes: 0.02 10*3/uL (ref 0.00–0.07)
Basophils Absolute: 0.1 10*3/uL (ref 0.0–0.1)
Basophils Relative: 1 %
Eosinophils Absolute: 0.2 10*3/uL (ref 0.0–0.5)
Eosinophils Relative: 4 %
HCT: 37.6 % (ref 36.0–46.0)
Hemoglobin: 11.8 g/dL — ABNORMAL LOW (ref 12.0–15.0)
Immature Granulocytes: 0 %
Lymphocytes Relative: 17 %
Lymphs Abs: 1 10*3/uL (ref 0.7–4.0)
MCH: 30.4 pg (ref 26.0–34.0)
MCHC: 31.4 g/dL (ref 30.0–36.0)
MCV: 96.9 fL (ref 80.0–100.0)
Monocytes Absolute: 0.6 10*3/uL (ref 0.1–1.0)
Monocytes Relative: 11 %
Neutro Abs: 3.9 10*3/uL (ref 1.7–7.7)
Neutrophils Relative %: 67 %
Platelets: 183 10*3/uL (ref 150–400)
RBC: 3.88 MIL/uL (ref 3.87–5.11)
RDW: 13.2 % (ref 11.5–15.5)
WBC: 5.8 10*3/uL (ref 4.0–10.5)
nRBC: 0 % (ref 0.0–0.2)

## 2020-02-03 LAB — BASIC METABOLIC PANEL
Anion gap: 7 (ref 5–15)
BUN: 19 mg/dL (ref 8–23)
CO2: 28 mmol/L (ref 22–32)
Calcium: 9.5 mg/dL (ref 8.9–10.3)
Chloride: 104 mmol/L (ref 98–111)
Creatinine, Ser: 1.09 mg/dL — ABNORMAL HIGH (ref 0.44–1.00)
GFR, Estimated: 51 mL/min — ABNORMAL LOW (ref >60)
Glucose, Bld: 99 mg/dL (ref 70–99)
Potassium: 3.9 mmol/L (ref 3.5–5.1)
Sodium: 139 mmol/L (ref 135–145)

## 2020-02-03 LAB — CBG MONITORING, ED: Glucose-Capillary: 101 mg/dL — ABNORMAL HIGH (ref 70–99)

## 2020-02-03 MED ORDER — SODIUM CHLORIDE 0.9 % IV BOLUS
1000.0000 mL | Freq: Once | INTRAVENOUS | Status: AC
  Administered 2020-02-03: 1000 mL via INTRAVENOUS

## 2020-02-03 MED ORDER — CEPHALEXIN 250 MG PO CAPS
500.0000 mg | ORAL_CAPSULE | Freq: Once | ORAL | Status: AC
  Administered 2020-02-03: 500 mg via ORAL
  Filled 2020-02-03: qty 2

## 2020-02-03 MED ORDER — CEPHALEXIN 500 MG PO CAPS: 500.0000 mg | ORAL_CAPSULE | Freq: Three times a day (TID) | ORAL | 0 refills | Status: AC

## 2020-02-03 NOTE — ED Provider Notes (Signed)
Care assumed from [facility], please see his note for full details, but in brief Lynn Price is a 79 y.o. female who presents after she had a near syncopal episode this morning while walking to answer her doorbell, when she opened the door the home health aide noted that she was diaphoretic and pale looking, and helped her to the ground but she did not completely lose consciousness.  Patient has been experiencing generalized weakness for about a year but has not been having syncopal episodes.  Physical exam is overall unremarkable, patient with no focal neurologic deficits.  Patient has reported some intermittent urinary symptoms.  No fevers, vital stable.  Labs show mild dehydration with slight bump in creatinine, but no other electrolyte derangements, no leukocytosis, stable hemoglobin.  EKG with normal sinus rhythm.  At shift change urinalysis is pending  F/u: UA, ambulate, anticipate discharge  BP 140/77   Pulse 74   Temp (!) 97.3 F (36.3 C) (Oral)   Resp 12   SpO2 99%   ED Course/Procedures   Labs Reviewed  URINALYSIS, ROUTINE W REFLEX MICROSCOPIC - Abnormal; Notable for the following components:      Result Value   Leukocytes,Ua SMALL (*)    Bacteria, UA RARE (*)    All other components within normal limits  BASIC METABOLIC PANEL - Abnormal; Notable for the following components:   Creatinine, Ser 1.09 (*)    GFR, Estimated 51 (*)    All other components within normal limits  CBC WITH DIFFERENTIAL/PLATELET - Abnormal; Notable for the following components:   Hemoglobin 11.8 (*)    All other components within normal limits  CBG MONITORING, ED - Abnormal; Notable for the following components:   Glucose-Capillary 101 (*)    All other components within normal limits  URINE CULTURE   EKG Interpretation  Date/Time:  Monday February 03 2020 11:23:15 EST Ventricular Rate:  68 PR Interval:    QRS Duration: 107 QT Interval:  407 QTC Calculation: 433 R  Axis:   -37 Text Interpretation: Sinus rhythm Left axis deviation Low voltage, precordial leads RSR' in V1 or V2, right VCD or RVH since last tracing no significant change Confirmed by Eber Hong ([id number]) on 02/03/2020 11:55:46 AM   Procedures  MDM   Urinalysis does have some WBCs, RBCs and rare bacteria present, with patient having urinary frequency and dysuria will send for culture and treat with Keflex, given first dose here in the ED.  Patient reports overall she is feeling improved after getting IV fluids.  She was ambulated here in the emergency department and did not have any near syncopal symptoms.  At this time feel she is stable for discharge back to her assisted living facility.  Patient will be transported via [facility].  Provided instructions, and return precautions, and prescription for Keflex.        Legrand Rams 02/03/20 2112    Eber Hong, MD 02/04/20 1308

## 2020-02-03 NOTE — ED Triage Notes (Signed)
Pt BIB [facility] from home. Pt has someone come by to give meds daily. When she went to let them in the house, they stated pt suddenly became diaphoretic, developed a headache and weakness and lowered herself to the ground. Pt is A&Ox4. Pt states her knees hurt pretty bad and that she has not had any energy since Jan of this year. The person at the pt's house stated that was not normal for pt to get weak like that and was concerned.

## 2020-02-03 NOTE — Discharge Instructions (Signed)
Your evaluation today was overall reassuring.  Please make sure you are drinking plenty of fluids, you did have a mild appearing UTI on your urinalysis today.  Please take Keflex 3 times daily with food for the next week.  Make sure you are getting up slowly when changing positions and if you feel lightheaded you should immediately sit down so that you do not pass out.  Please follow-up closely with your PCP.  Return for any new or worsening symptoms.

## 2020-02-03 NOTE — ED Provider Notes (Signed)
[facility]ENC[facility] Provider Note   CSN: [id number] Arrival date & time:        History Chief Complaint  Patient presents with  . Weakness    Lowanda Fosterlaine C Koepp is a 79 y.o. female with a history of hypertension, GERD, and peripheral neuropathy.  Patient presents after a near syncopal episode this morning.  Patient reports that when going to answer the door she stood up without issue.  When she began walking she started feeling lightheaded, again diaphoretic, and felt weak.  She was able to open the front door and home health aide to lower the patient to the floor.  Patient denied any syncope, chest pain, palpitations, shortness of breath, seizures, slurred speech, focal neuro deficit, facial asymmetry.  Per EMS bystander reported the patient was diaphoretic. Patient reports that she has had generalized weakness January of this year.    Patient reports that she fell Thanksgiving day, at that time she did not hit her head, no syncope, was not seen by her PCP or an ED, fall occurred while with her son.  No falls or traumatic injuries since then.  Patient denies being on any blood thinners.          HPI     Past Medical History:  Diagnosis Date  . Anxiety   . Fibromyalgia   . GERD (gastroesophageal reflux disease)   . High blood pressure   . HSV (herpes simplex virus) anogenital infection   . IBS (irritable bowel syndrome)   . Memory loss   . Neuropathy   . Obesity (BMI 30-39.9) 04/01/2014  . Osteoarthritis   . Overactive bladder   . Restless leg syndrome   . Sleep apnea     Patient Active Problem List   Diagnosis Date Noted  . Nausea & vomiting 12/16/2017  . Diarrhea 12/16/2017  . Hypokalemia 12/16/2017  . Gait abnormality 08/30/2017  . Lumbar radiculopathy 08/30/2017  . Chronic left-sided low back pain with left-sided sciatica 07/21/2017  . Personal history of calcium pyrophosphate deposition disease (CPPD) 07/29/2016  . History of  peripheral neuropathy 07/29/2016  . History of total knee replacement, right 07/22/2016  . Pseudogout 01/22/2016  . Primary osteoarthritis of both knees 01/22/2016  . Primary osteoarthritis of both hands 01/22/2016  . Primary osteoarthritis of both feet 01/22/2016  . Fibromyalgia 01/22/2016  . Primary insomnia 01/22/2016  . Obesity (BMI 30-39.9) 04/01/2014  . Obstructive sleep apnea 04/19/2013  . Essential hypertension, benign 04/19/2013  . Memory loss 08/02/2012    Past Surgical History:  Procedure Laterality Date  . APPENDECTOMY    . CATARACT EXTRACTION Left   . CESAREAN SECTION    . CHOLECYSTECTOMY    . colonscopy    . MULTIPLE TOOTH EXTRACTIONS    . ROTATOR CUFF REPAIR Left   . TOTAL KNEE ARTHROPLASTY       OB History   No obstetric history on file.     Family History  Problem Relation Age of Onset  . Depression Mother   . Depression Father   . Mental illness Sister   . Mental illness Sister     Social History   Tobacco Use  . Smoking status: Never Smoker  . Smokeless tobacco: Never Used  Vaping Use  . Vaping Use: Never used  Substance Use Topics  . Alcohol use: Not Currently    Comment: wine occas  . Drug use: No    Home Medications Prior to Admission medications   Medication Sig  Start Date End Date Taking? Authorizing Provider  acetaminophen (TYLENOL) 325 MG tablet Take 325 mg by mouth See admin instructions. 325 mg twice a day and can take every 6 hours as needed    [provider]  acyclovir (ZOVIRAX) 400 MG tablet Take 400 mg by mouth 2 (two) times daily.     [provider]  diltiazem (CARDIZEM CD) 120 MG 24 hr capsule Take 120 mg by mouth daily.    [provider]  donepezil (ARICEPT) 5 MG tablet TAKE ONE TABLET AT BEDTIME. 09/25/19   Glean Salvo, NP  FLUOCINOLONE ACETONIDE SCALP EX Apply 1 application topically 3 (three) times a week.    [provider]  gabapentin (NEURONTIN) 300 MG capsule Take 1 capsule  by mouth at bedtime only. Patient taking differently: Take 300 mg by mouth at bedtime.  03/07/19   Pollyann Savoy, MD  LORazepam (ATIVAN) 1 MG tablet Take 1 mg by mouth 2 (two) times daily as needed for anxiety.    [provider]  Omega-3 Fatty Acids (FISH OIL) 1200 MG CAPS Take 1,200 mg by mouth daily.     [provider]  polyethylene glycol (MIRALAX) 17 g packet Take 17 g by mouth daily. Dissolve one cap full in solution (water, gatorade, etc.) and administer once cap-full daily. You may titrate up daily by 1 cap-full until the patient is having pudding consistency of stools. After the patient is able to start passing softer stools they will need to be on 1/2 cap-full daily for 2 weeks. Patient taking differently: Take 17 g by mouth daily as needed for mild constipation.  06/08/19   Couture, Cortni S, PA-C  QUEtiapine (SEROQUEL) 25 MG tablet Take 25 mg by mouth at bedtime.  05/22/17   [provider]    Allergies    Erythromycin, Penicillins, Sulfa antibiotics, and Tetracyclines & related  Review of Systems   Review of Systems  Constitutional: Negative for chills and fever.  Eyes: Negative for visual disturbance.  Respiratory: Negative for cough and shortness of breath.   Cardiovascular: Negative for chest pain.  Gastrointestinal: Negative for abdominal pain, nausea and vomiting.  Genitourinary: Positive for dysuria and frequency. Negative for difficulty urinating, vaginal bleeding, vaginal discharge and vaginal pain.  Musculoskeletal: Positive for arthralgias (bilateral knee and bilateral shoulder). Negative for back pain and neck pain.  Skin: Negative for color change and rash.  Neurological: Positive for dizziness, weakness (Generalized) and light-headedness. Negative for tremors, seizures, syncope, facial asymmetry, speech difficulty, numbness and headaches.  Psychiatric/Behavioral: Negative for confusion and suicidal ideas.    Physical Exam Updated Vital  Signs BP 140/77   Pulse 74   Temp (!) 97.3 F (36.3 C) (Oral)   Resp 12   SpO2 99%   Physical Exam Constitutional:      General: She is not in acute distress.    Appearance: She is not ill-appearing, toxic-appearing or diaphoretic.  HENT:     Head: Normocephalic and atraumatic.  Eyes:     Extraocular Movements: Extraocular movements intact.     Pupils: Pupils are equal, round, and reactive to light.  Cardiovascular:     Rate and Rhythm: Normal rate and regular rhythm.     Heart sounds: Normal heart sounds.  Pulmonary:     Effort: Pulmonary effort is normal.     Breath sounds: Normal breath sounds.  Abdominal:     Palpations: Abdomen is soft.     Tenderness: There is no abdominal tenderness. There  is no right CVA tenderness or left CVA tenderness.  Musculoskeletal:     Cervical back: Normal range of motion. No rigidity or torticollis. No spinous process tenderness or muscular tenderness.  Skin:    General: Skin is warm and dry.  Neurological:     General: No focal deficit present.     Mental Status: She is alert.     GCS: GCS eye subscore is 4. GCS verbal subscore is 5. GCS motor subscore is 6.     Cranial Nerves: No facial asymmetry.     Motor: No weakness, tremor or seizure activity.     ED Results / Procedures / Treatments   Labs (all labs ordered are listed, but only abnormal results are displayed) Labs Reviewed  BASIC METABOLIC PANEL - Abnormal; Notable for the following components:      Result Value   Creatinine, Ser 1.09 (*)    GFR, Estimated 51 (*)    All other components within normal limits  CBC WITH DIFFERENTIAL/PLATELET - Abnormal; Notable for the following components:   Hemoglobin 11.8 (*)    All other components within normal limits  CBG MONITORING, ED - Abnormal; Notable for the following components:   Glucose-Capillary 101 (*)    All other components within normal limits  URINE CULTURE  URINALYSIS, ROUTINE W REFLEX MICROSCOPIC    EKG EKG  Interpretation  Date/Time:  Monday February 03 2020 11:23:15 EST Ventricular Rate:  68 PR Interval:    QRS Duration: 107 QT Interval:  407 QTC Calculation: 433 R Axis:   -37 Text Interpretation: Sinus rhythm Left axis deviation Low voltage, precordial leads RSR' in V1 or V2, right VCD or RVH since last tracing no significant change Confirmed by Eber Hong ([id number]) on 02/03/2020 11:55:46 AM   Radiology No results found.  Procedures Procedures (including critical care time)  Medications Ordered in ED Medications  sodium chloride 0.9 % bolus 1,000 mL (0 mLs Intravenous Stopped 02/03/20 1604)    ED Course  I have reviewed the triage vital signs and the nursing notes.  Pertinent labs & imaging results that were available during my care of the patient were reviewed by me and considered in my medical decision making (see chart for details).    MDM Rules/Calculators/A&P                          Alert and oriented 79 year old female in no acute distress presents the emergency department after a near syncopal episode.  Reported this happened after he transitioned from a sitting to standing position to answer the front door.  Patient denies any syncope but states she did feel lightheaded dizzy.  Per EMS bystander noted the patient left diaphoretic, and helped lower the patient to the ground.   Patient denied any syncope, chest pain, palpitations, shortness of breath, seizures, slurred speech, focal neuro deficit, facial asymmetry.  Patient reports that she has had generalized weakness January of this year.  Patient endorses dysuria and increased urinary frequency.    On physical exam patient's lungs clear to auscultation bilaterally, normal heart rate regular rhythm and no murmurs rubs or gallops, no facial asymmetry or focal neurological deficit noted, no weakness noted.    BMP shows creatinine slightly elevated from baseline 1.09, CBG is 101, CBC shows hemoglobin slightly lower at 11.8.   UA and culture pending.    Vital signs remained stable throughout her hospitalization, she has received fluid bolus, has been assisted out of  the bed with nursing with no complaints of lightheadedness, dizziness or syncopal episodes.  Exact etiology of patient's could be related to change in orthostatic blood pressures, or possibly change due to a urinary tract infection.    If urinalysis is consistent with infection patient should be treated with antibiotics, discharge home.  Patient likely will need transport assistance.    Patient care transferred to K. Ford, PA-C at the end of my shift. Patient presentation, ED course, and plan of care discussed with review of all pertinent labs and imaging. Please see his/her note for further details regarding further ED course and disposition.   Final Clinical Impression(s) / ED Diagnoses Final diagnoses:  None    Rx / DC Orders ED Discharge Orders    None       Berneice Heinrich 02/03/20 1626    Eber Hong, MD 02/04/20 1309

## 2020-02-03 NOTE — ED Notes (Signed)
Help get patient on the monitor did ekg shown to the er doctor patient is resting with call bell in reach

## 2020-02-05 LAB — URINE CULTURE

## 2020-03-02 DIAGNOSIS — N189 Chronic kidney disease, unspecified: Secondary | ICD-10-CM | POA: Diagnosis not present

## 2020-03-02 DIAGNOSIS — K5730 Diverticulosis of large intestine without perforation or abscess without bleeding: Secondary | ICD-10-CM | POA: Diagnosis not present

## 2020-03-02 DIAGNOSIS — G3184 Mild cognitive impairment, so stated: Secondary | ICD-10-CM | POA: Diagnosis not present

## 2020-03-02 DIAGNOSIS — I129 Hypertensive chronic kidney disease with stage 1 through stage 4 chronic kidney disease, or unspecified chronic kidney disease: Secondary | ICD-10-CM | POA: Diagnosis not present

## 2020-03-02 DIAGNOSIS — K5900 Constipation, unspecified: Secondary | ICD-10-CM | POA: Diagnosis not present

## 2020-03-04 DIAGNOSIS — K5900 Constipation, unspecified: Secondary | ICD-10-CM | POA: Diagnosis not present

## 2020-03-04 DIAGNOSIS — N183 Chronic kidney disease, stage 3 unspecified: Secondary | ICD-10-CM | POA: Diagnosis not present

## 2020-03-04 DIAGNOSIS — E7800 Pure hypercholesterolemia, unspecified: Secondary | ICD-10-CM | POA: Diagnosis not present

## 2020-03-04 DIAGNOSIS — K5730 Diverticulosis of large intestine without perforation or abscess without bleeding: Secondary | ICD-10-CM | POA: Diagnosis not present

## 2020-03-04 DIAGNOSIS — G3184 Mild cognitive impairment, so stated: Secondary | ICD-10-CM | POA: Diagnosis not present

## 2020-03-04 DIAGNOSIS — N189 Chronic kidney disease, unspecified: Secondary | ICD-10-CM | POA: Diagnosis not present

## 2020-03-04 DIAGNOSIS — I129 Hypertensive chronic kidney disease with stage 1 through stage 4 chronic kidney disease, or unspecified chronic kidney disease: Secondary | ICD-10-CM | POA: Diagnosis not present

## 2020-03-09 ENCOUNTER — Other Ambulatory Visit: Payer: Self-pay | Admitting: *Deleted

## 2020-03-09 MED ORDER — DONEPEZIL HCL 5 MG PO TABS
5.0000 mg | ORAL_TABLET | Freq: Every day | ORAL | 1 refills | Status: DC
Start: 2020-03-09 — End: 2020-07-06

## 2020-03-10 DIAGNOSIS — K589 Irritable bowel syndrome without diarrhea: Secondary | ICD-10-CM | POA: Diagnosis not present

## 2020-03-10 DIAGNOSIS — R413 Other amnesia: Secondary | ICD-10-CM | POA: Diagnosis not present

## 2020-03-10 DIAGNOSIS — K5900 Constipation, unspecified: Secondary | ICD-10-CM | POA: Diagnosis not present

## 2020-03-10 DIAGNOSIS — I129 Hypertensive chronic kidney disease with stage 1 through stage 4 chronic kidney disease, or unspecified chronic kidney disease: Secondary | ICD-10-CM | POA: Diagnosis not present

## 2020-03-12 DIAGNOSIS — M5412 Radiculopathy, cervical region: Secondary | ICD-10-CM | POA: Diagnosis not present

## 2020-03-12 DIAGNOSIS — M542 Cervicalgia: Secondary | ICD-10-CM | POA: Diagnosis not present

## 2020-03-17 DIAGNOSIS — M5412 Radiculopathy, cervical region: Secondary | ICD-10-CM | POA: Diagnosis not present

## 2020-03-17 DIAGNOSIS — M542 Cervicalgia: Secondary | ICD-10-CM | POA: Diagnosis not present

## 2020-03-19 DIAGNOSIS — M5412 Radiculopathy, cervical region: Secondary | ICD-10-CM | POA: Diagnosis not present

## 2020-03-19 DIAGNOSIS — M542 Cervicalgia: Secondary | ICD-10-CM | POA: Diagnosis not present

## 2020-03-24 DIAGNOSIS — M542 Cervicalgia: Secondary | ICD-10-CM | POA: Diagnosis not present

## 2020-03-24 DIAGNOSIS — M5412 Radiculopathy, cervical region: Secondary | ICD-10-CM | POA: Diagnosis not present

## 2020-03-27 DIAGNOSIS — M5412 Radiculopathy, cervical region: Secondary | ICD-10-CM | POA: Diagnosis not present

## 2020-03-27 DIAGNOSIS — M542 Cervicalgia: Secondary | ICD-10-CM | POA: Diagnosis not present

## 2020-03-31 DIAGNOSIS — M5412 Radiculopathy, cervical region: Secondary | ICD-10-CM | POA: Diagnosis not present

## 2020-03-31 DIAGNOSIS — M542 Cervicalgia: Secondary | ICD-10-CM | POA: Diagnosis not present

## 2020-04-03 DIAGNOSIS — M542 Cervicalgia: Secondary | ICD-10-CM | POA: Diagnosis not present

## 2020-04-03 DIAGNOSIS — M5412 Radiculopathy, cervical region: Secondary | ICD-10-CM | POA: Diagnosis not present

## 2020-04-07 DIAGNOSIS — M542 Cervicalgia: Secondary | ICD-10-CM | POA: Diagnosis not present

## 2020-04-07 DIAGNOSIS — M5412 Radiculopathy, cervical region: Secondary | ICD-10-CM | POA: Diagnosis not present

## 2020-04-09 DIAGNOSIS — M5412 Radiculopathy, cervical region: Secondary | ICD-10-CM | POA: Diagnosis not present

## 2020-04-09 DIAGNOSIS — M542 Cervicalgia: Secondary | ICD-10-CM | POA: Diagnosis not present

## 2020-04-14 DIAGNOSIS — M542 Cervicalgia: Secondary | ICD-10-CM | POA: Diagnosis not present

## 2020-04-14 DIAGNOSIS — M5412 Radiculopathy, cervical region: Secondary | ICD-10-CM | POA: Diagnosis not present

## 2020-04-23 DIAGNOSIS — I129 Hypertensive chronic kidney disease with stage 1 through stage 4 chronic kidney disease, or unspecified chronic kidney disease: Secondary | ICD-10-CM | POA: Diagnosis not present

## 2020-04-23 DIAGNOSIS — N183 Chronic kidney disease, stage 3 unspecified: Secondary | ICD-10-CM | POA: Diagnosis not present

## 2020-04-23 DIAGNOSIS — E7800 Pure hypercholesterolemia, unspecified: Secondary | ICD-10-CM | POA: Diagnosis not present

## 2020-05-28 DIAGNOSIS — E7800 Pure hypercholesterolemia, unspecified: Secondary | ICD-10-CM | POA: Diagnosis not present

## 2020-05-28 DIAGNOSIS — I129 Hypertensive chronic kidney disease with stage 1 through stage 4 chronic kidney disease, or unspecified chronic kidney disease: Secondary | ICD-10-CM | POA: Diagnosis not present

## 2020-05-28 DIAGNOSIS — N183 Chronic kidney disease, stage 3 unspecified: Secondary | ICD-10-CM | POA: Diagnosis not present

## 2020-06-17 ENCOUNTER — Other Ambulatory Visit: Payer: Self-pay

## 2020-06-17 ENCOUNTER — Emergency Department (HOSPITAL_COMMUNITY)
Admission: EM | Admit: 2020-06-17 | Discharge: 2020-06-17 | Disposition: A | Discharge status: Home / Self Care | Payer: Medicare Other | Attending: Emergency Medicine | Admitting: Emergency Medicine

## 2020-06-17 ENCOUNTER — Encounter (HOSPITAL_COMMUNITY): Payer: Self-pay

## 2020-06-17 DIAGNOSIS — Z743 Need for continuous supervision: Secondary | ICD-10-CM | POA: Diagnosis not present

## 2020-06-17 DIAGNOSIS — Z96651 Presence of right artificial knee joint: Secondary | ICD-10-CM | POA: Diagnosis not present

## 2020-06-17 DIAGNOSIS — I10 Essential (primary) hypertension: Secondary | ICD-10-CM | POA: Diagnosis not present

## 2020-06-17 DIAGNOSIS — Z043 Encounter for examination and observation following other accident: Secondary | ICD-10-CM | POA: Diagnosis not present

## 2020-06-17 DIAGNOSIS — W19XXXA Unspecified fall, initial encounter: Secondary | ICD-10-CM

## 2020-06-17 DIAGNOSIS — N183 Chronic kidney disease, stage 3 unspecified: Secondary | ICD-10-CM | POA: Diagnosis not present

## 2020-06-17 DIAGNOSIS — R6889 Other general symptoms and signs: Secondary | ICD-10-CM | POA: Diagnosis not present

## 2020-06-17 DIAGNOSIS — I129 Hypertensive chronic kidney disease with stage 1 through stage 4 chronic kidney disease, or unspecified chronic kidney disease: Secondary | ICD-10-CM | POA: Diagnosis not present

## 2020-06-17 DIAGNOSIS — E7800 Pure hypercholesterolemia, unspecified: Secondary | ICD-10-CM | POA: Diagnosis not present

## 2020-06-17 DIAGNOSIS — R404 Transient alteration of awareness: Secondary | ICD-10-CM | POA: Diagnosis not present

## 2020-06-17 DIAGNOSIS — M549 Dorsalgia, unspecified: Secondary | ICD-10-CM | POA: Diagnosis not present

## 2020-06-17 LAB — CBC WITH DIFFERENTIAL/PLATELET
Abs Immature Granulocytes: 0.05 10*3/uL (ref 0.00–0.07)
Basophils Absolute: 0.1 10*3/uL (ref 0.0–0.1)
Basophils Relative: 1 %
Eosinophils Absolute: 0.1 10*3/uL (ref 0.0–0.5)
Eosinophils Relative: 1 %
HCT: 37.1 % (ref 36.0–46.0)
Hemoglobin: 11.9 g/dL — ABNORMAL LOW (ref 12.0–15.0)
Immature Granulocytes: 0 %
Lymphocytes Relative: 9 %
Lymphs Abs: 1 10*3/uL (ref 0.7–4.0)
MCH: 31.1 pg (ref 26.0–34.0)
MCHC: 32.1 g/dL (ref 30.0–36.0)
MCV: 96.9 fL (ref 80.0–100.0)
Monocytes Absolute: 0.7 10*3/uL (ref 0.1–1.0)
Monocytes Relative: 6 %
Neutro Abs: 9.2 10*3/uL — ABNORMAL HIGH (ref 1.7–7.7)
Neutrophils Relative %: 83 %
Platelets: 190 10*3/uL (ref 150–400)
RBC: 3.83 MIL/uL — ABNORMAL LOW (ref 3.87–5.11)
RDW: 13.3 % (ref 11.5–15.5)
WBC: 11.2 10*3/uL — ABNORMAL HIGH (ref 4.0–10.5)
nRBC: 0 % (ref 0.0–0.2)

## 2020-06-17 LAB — BASIC METABOLIC PANEL
Anion gap: 8 (ref 5–15)
BUN: 18 mg/dL (ref 8–23)
CO2: 28 mmol/L (ref 22–32)
Calcium: 9.4 mg/dL (ref 8.9–10.3)
Chloride: 101 mmol/L (ref 98–111)
Creatinine, Ser: 0.97 mg/dL (ref 0.44–1.00)
GFR, Estimated: 59 mL/min — ABNORMAL LOW (ref >60)
Glucose, Bld: 116 mg/dL — ABNORMAL HIGH (ref 70–99)
Potassium: 3.7 mmol/L (ref 3.5–5.1)
Sodium: 137 mmol/L (ref 135–145)

## 2020-06-17 NOTE — ED Notes (Signed)
Pt verbalizes understanding of discharge instructions. Opportunity for questions and answers were provided. Armband removed by staff, pt discharged from the ED.  

## 2020-06-17 NOTE — ED Triage Notes (Signed)
EMS arrival from independent living apartment, post unwitnessed fall. Per EMS pt denies LOC, unsure of hit head, reports catching self, co back pain A&Ox3. No injuries noted on scene, pupils slightly pinpoint on scene.   130/67, 60s, 100%, CBG 136

## 2020-06-17 NOTE — ED Provider Notes (Signed)
[facility] EMERGENCY DEPARTMENT Provider Note  CSN: [id number] Arrival date & time: 06/17/20 0234  Chief Complaint(s) Fall  HPI Lynn Price is a 80 y.o. female who presented from home after a fall.  Patient reports taking her nighttime sleep medicine earlier this evening.  She then went to the kitchen and was putting some cups away.  She reports feeling off balance causing her to fall backwards catching herself with the cabinet and sliding down onto her buttock.  Patient denies any head trauma or loss of consciousness.  She denied any associated chest pain or shortness of breath.  No neck pain or back pain.  No abdominal pain.  No lower extremity pain.  She denies any focal deficits.  HPI  Past Medical History Past Medical History:  Diagnosis Date  . Anxiety   . Fibromyalgia   . GERD (gastroesophageal reflux disease)   . High blood pressure   . HSV (herpes simplex virus) anogenital infection   . IBS (irritable bowel syndrome)   . Memory loss   . Neuropathy   . Obesity (BMI 30-39.9) 04/01/2014  . Osteoarthritis   . Overactive bladder   . Restless leg syndrome   . Sleep apnea    Patient Active Problem List   Diagnosis Date Noted  . Nausea & vomiting 12/16/2017  . Diarrhea 12/16/2017  . Hypokalemia 12/16/2017  . Gait abnormality 08/30/2017  . Lumbar radiculopathy 08/30/2017  . Chronic left-sided low back pain with left-sided sciatica 07/21/2017  . Personal history of calcium pyrophosphate deposition disease (CPPD) 07/29/2016  . History of peripheral neuropathy 07/29/2016  . History of total knee replacement, right 07/22/2016  . Pseudogout 01/22/2016  . Primary osteoarthritis of both knees 01/22/2016  . Primary osteoarthritis of both hands 01/22/2016  . Primary osteoarthritis of both feet 01/22/2016  . Fibromyalgia 01/22/2016  . Primary insomnia 01/22/2016  . Obesity (BMI 30-39.9) 04/01/2014  . Obstructive sleep apnea 04/19/2013  . Essential  hypertension, benign 04/19/2013  . Memory loss 08/02/2012   Home Medication(s) Prior to Admission medications   Medication Sig Start Date End Date Taking? Authorizing Provider  acetaminophen (TYLENOL) 325 MG tablet Take 325 mg by mouth See admin instructions. 325 mg twice a day and can take every 6 hours as needed    [provider]  acyclovir (ZOVIRAX) 400 MG tablet Take 400 mg by mouth 2 (two) times daily.     [provider]  diltiazem (CARDIZEM CD) 120 MG 24 hr capsule Take 120 mg by mouth daily.    [provider]  donepezil (ARICEPT) 5 MG tablet Take 1 tablet (5 mg total) by mouth at bedtime. 03/09/20   Glean Salvo, NP  FLUOCINOLONE ACETONIDE SCALP EX Apply 1 application topically 3 (three) times a week.    [provider]  gabapentin (NEURONTIN) 300 MG capsule Take 1 capsule by mouth at bedtime only. Patient taking differently: Take 300 mg by mouth at bedtime.  03/07/19   Pollyann Savoy, MD  LORazepam (ATIVAN) 1 MG tablet Take 1 mg by mouth 2 (two) times daily as needed for anxiety.    [provider]  Omega-3 Fatty Acids (FISH OIL) 1200 MG CAPS Take 1,200 mg by mouth daily.     [provider]  polyethylene glycol (MIRALAX) 17 g packet Take 17 g by mouth daily. Dissolve one cap full in solution (water, gatorade, etc.) and administer once cap-full daily. You may titrate up daily by 1 cap-full until the patient is  having pudding consistency of stools. After the patient is able to start passing softer stools they will need to be on 1/2 cap-full daily for 2 weeks. Patient taking differently: Take 17 g by mouth daily as needed for mild constipation.  06/08/19   Couture, Cortni S, PA-C  QUEtiapine (SEROQUEL) 25 MG tablet Take 25 mg by mouth at bedtime.  05/22/17   [provider]                                                                                                                                    Past Surgical  History Past Surgical History:  Procedure Laterality Date  . APPENDECTOMY    . CATARACT EXTRACTION Left   . CESAREAN SECTION    . CHOLECYSTECTOMY    . colonscopy    . MULTIPLE TOOTH EXTRACTIONS    . ROTATOR CUFF REPAIR Left   . TOTAL KNEE ARTHROPLASTY     Family History Family History  Problem Relation Age of Onset  . Depression Mother   . Depression Father   . Mental illness Sister   . Mental illness Sister     Social History Social History   Tobacco Use  . Smoking status: Never Smoker  . Smokeless tobacco: Never Used  Vaping Use  . Vaping Use: Never used  Substance Use Topics  . Alcohol use: Not Currently    Comment: wine occas  . Drug use: No   Allergies Erythromycin, Penicillins, Sulfa antibiotics, and Tetracyclines & related  Review of Systems Review of Systems All other systems are reviewed and are negative for acute change except as noted in the HPI  Physical Exam Vital Signs  I have reviewed the triage vital signs BP 131/74   Pulse 79   Temp 98.2 F (36.8 C) (Oral)   Resp 20   SpO2 95%   Physical Exam Vitals reviewed.  Constitutional:      General: She is not in acute distress.    Appearance: She is well-developed. She is not diaphoretic.  HENT:     Head: Normocephalic and atraumatic.     Nose: Nose normal.  Eyes:     General: No scleral icterus.       Right eye: No discharge.        Left eye: No discharge.     Conjunctiva/sclera: Conjunctivae normal.     Pupils: Pupils are equal, round, and reactive to light.  Cardiovascular:     Rate and Rhythm: Normal rate and regular rhythm.     Heart sounds: No murmur heard. No friction rub. No gallop.   Pulmonary:     Effort: Pulmonary effort is normal. No respiratory distress.     Breath sounds: Normal breath sounds. No stridor. No rales.  Abdominal:     General: There is no distension.     Palpations: Abdomen is soft.     Tenderness: There is no abdominal tenderness.  Musculoskeletal:         General: No tenderness.     Cervical back: Normal range of motion and neck supple.  Skin:    General: Skin is warm and dry.     Findings: No erythema or rash.  Neurological:     Mental Status: She is alert and oriented to person, place, and time.     Cranial Nerves: Cranial nerves are intact.     Sensory: Sensation is intact.     Motor: Motor function is intact.     Coordination: Coordination is intact.     ED Results and Treatments Labs (all labs ordered are listed, but only abnormal results are displayed) Labs Reviewed  CBC WITH DIFFERENTIAL/PLATELET - Abnormal; Notable for the following components:      Result Value   WBC 11.2 (*)    RBC 3.83 (*)    Hemoglobin 11.9 (*)    Neutro Abs 9.2 (*)    All other components within normal limits  BASIC METABOLIC PANEL - Abnormal; Notable for the following components:   Glucose, Bld 116 (*)    GFR, Estimated 59 (*)    All other components within normal limits                                                                                                                         EKG  EKG Interpretation  Date/Time:  Wednesday June 17 2020 06:07:33 EDT Ventricular Rate:  68 PR Interval:  169 QRS Duration: 98 QT Interval:  412 QTC Calculation: 439 R Axis:   -32 Text Interpretation: Sinus rhythm Atrial premature complexes Left axis deviation Low voltage, precordial leads No acute changes Confirmed by Drema Pry [phone]) on 06/17/2020 7:26:19 AM      Radiology No results found.  Pertinent labs & imaging results that were available during my care of the patient were reviewed by me and considered in my medical decision making (see chart for details).  Medications Ordered in ED Medications - No data to display                                                                                                                                  Procedures Procedures  (including critical care time)  Medical Decision Making / ED  Course I have reviewed the nursing notes for this encounter and the patient's prior records (if available in EHR or on  provided paperwork).   Gretta Arablaine C Bernards was evaluated in Emergency Department on 06/18/2020 for the symptoms described in the history of present illness. She was evaluated in the context of the global COVID-19 pandemic, which necessitated consideration that the patient might be at risk for infection with the SARS-CoV-2 virus that causes COVID-19. Institutional protocols and algorithms that pertain to the evaluation of patients at risk for COVID-19 are in a state of rapid change based on information released by regulatory bodies including the CDC and federal and state organizations. These policies and algorithms were followed during the patient's care in the ED.  Fall related to sedative medication. No acute injuries. EKG without any acute ischemic changes, significant dysrhythmias or blocks. Labs reassuring with stable hemoglobin.  No significant electrolyte derangements or renal sufficiency.      Final Clinical Impression(s) / ED Diagnoses Final diagnoses:  Fall, initial encounter   The patient appears reasonably screened and/or stabilized for discharge and I doubt any other medical condition or other [facility] requiring further screening, evaluation, or treatment in the ED at this time prior to discharge. Safe for discharge with strict return precautions.  Disposition: Discharge  Condition: Good  I have discussed the results, Dx and Tx plan with the patient/family who expressed understanding and agree(s) with the plan. Discharge instructions discussed at length. The patient/family was given strict return precautions who verbalized understanding of the instructions. No further questions at time of discharge.    ED Discharge Orders    None      Follow Up: Blair HeysEhinger, Robert, MD [address] [place] [id number]  Call  to schedule an  appointment for close follow up      This chart was dictated using voice recognition software.  Despite best efforts to proofread,  errors can occur which can change the documentation meaning.   Nira Connardama, Jatorian Renault Eduardo, MD 06/18/20 [phone]

## 2020-07-02 DIAGNOSIS — R300 Dysuria: Secondary | ICD-10-CM | POA: Diagnosis not present

## 2020-07-05 ENCOUNTER — Other Ambulatory Visit: Payer: Self-pay | Admitting: Neurology

## 2020-07-28 DIAGNOSIS — N3090 Cystitis, unspecified without hematuria: Secondary | ICD-10-CM | POA: Diagnosis not present

## 2020-08-11 DIAGNOSIS — E7800 Pure hypercholesterolemia, unspecified: Secondary | ICD-10-CM | POA: Diagnosis not present

## 2020-08-11 DIAGNOSIS — N183 Chronic kidney disease, stage 3 unspecified: Secondary | ICD-10-CM | POA: Diagnosis not present

## 2020-08-11 DIAGNOSIS — I129 Hypertensive chronic kidney disease with stage 1 through stage 4 chronic kidney disease, or unspecified chronic kidney disease: Secondary | ICD-10-CM | POA: Diagnosis not present

## 2020-09-21 NOTE — Progress Notes (Deleted)
[name] DOB: December 30, 1940  REASON FOR VISIT: follow up HISTORY FROM: patient  HISTORY OF PRESENT ILLNESS: Today 09/21/20  HISTORY  Lynn Price, 80 year old female was referred by her primary care physician Dr. Blair Heys for evaluation of memory trouble. She was last seen in June 2015 by [name] for memory loss   She has a history of fibromyalgia hypertension, anxiety, obstructive sleep apnea using CPAP and short-term memory trouble. She also has a history of bilateral feet paresthesias.  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  She denies significant family history of dementia    MRI of the brain 07/11/2011 shows mild changes of chronic microvascular ischemia and generalized cerebral atrophy.     The patient continues to drive and has gotten lost in unfamiliar surroundings. She continues to be independent with her activities of daily living. She plays golf intermittently otherwise she gets no regular exercise. She has not had any falls, long-standing history of frequency incontinence and on oxybutynin. Her Mini-Mental Status exam is stable. She returns for reevaluation   UPDATE June 6th 2016. She is with her husband at today's clinical visit, she continue has mild slow worsening memory trouble, she continued to drive short distances in a familiar route, she visit her friends, go to church regularly, able to clean house, independent at daily activity,   She also complains of bilateral feet numbness tingling, slow worsening, she is taking gabapentin 300 mg 2 tablets in the morning 3 tablets every night, does make her feel tired and sleepy,   She has not used her CPAP machine for a while, tends to go to bed late, take long nap in the afternoon,   She also complains of being fatigued, lack of energy   UPDATE August 30 2017:   She is accompanied by her husband at today's clinical visit, she has been followed by our  office regularly for memory loss, is referred back by her primary care physician Dr. Beverley Fiedler for evaluation of worsening low back pain, gait abnormality,   She had history of chronic low back pain, gradually getting worse, radiating pain to left lower extremity, bilateral leg numbness, weakness, left worse than right, has tripped and fell few times, is receiving epidural injection which has helped her low back pain, she also complains of occasional knee bowel and bladder incontinence, frequent urinary urgency.   Personally reviewed MRI of lumbar in June 23, 2017, there is evidence of multilevel degenerative changes, progression of right-sided disc protrusion L4-5 with expected impingement of right L5 nerve roots, diffuse endplate spurring, right greater than left L5-S1, are exchanged with subarticular foraminal stenosis, right greater than left   Update 09/18/2018 SS: Ms. Lynn Price is a 80 year old female with history of memory disturbance and bilateral lumbar radiculopathy. She presents today for follow-up accompanied by her granddaughter.  She indicates her memory is fair.  She lives with her husband, she is able to perform her ADLs, manages cooking, finances with her husband.  She has a good appetite. She manages her medications in a weekly pillbox. Occasionally she may miss a dose of medicine.  She is able to drive short distances only.  She recently had eye surgery.  She does not have any family nearby, but her family rotates in and out to visit.  Unfortunately, her husband has developed memory trouble and his is more progressive.  She is having to care for him.  At times she  reports she may hear a man on the porch, but knows he is not here.  She does follow with Dr. Cleophas Dunker for chronic back pain, she says she completed physical therapy earlier this year and it was beneficial.  She indicates that her chronic low back pain has improved, she reports some weakness in her legs.  She denies bowel or  bladder incontinence.  She has not had an epidural injection in a while, she uses max freeze on her back.  She is considering assisted living for her and her husband.  Update September 19, 2019 SS: Here today alone, "family falling apart", son got sick-had surgery [place], husband is in memory care. She lives in Independent Living at "[facility]" apartment. Is still driving, doing this well. Memory is stable, MMSE 30/30 today. Her health is up and down, ER visits for constipation, dehydration. Has psoriasis in scalp, seeing Dermatology. She manages her ADLs, household affairs, has a lot of paperwork to do related to finances, her sisters helps her with this. Hasn't been able to do much she enjoys (walking, playing games) because busy with stock papers. Takes Seroquel at night for sleep, sleeps well, denies hallucinations. Appetite is good. No falls recently. Has arthritis in her shoulders, doing well with aricept 5 mg, no side effects.  Here today unaccompanied.  REVIEW OF SYSTEMS: Out of a complete 14 system review of symptoms, the patient complains only of the following symptoms, and all other reviewed systems are negative.  Memory loss  ALLERGIES: Allergies  Allergen Reactions   Erythromycin Rash   Penicillins Rash    Remote reaction, no medical attention required No cutaneous or anaphylactic symptoms Tolerates Keflex   Sulfa Antibiotics Rash   Tetracyclines & Related Rash    HOME MEDICATIONS: Outpatient Medications Prior to Visit  Medication Sig Dispense Refill   acetaminophen (TYLENOL) 325 MG tablet Take 325 mg by mouth See admin instructions. 325 mg twice a day and can take every 6 hours as needed     acyclovir (ZOVIRAX) 400 MG tablet Take 400 mg by mouth 2 (two) times daily.      diltiazem (CARDIZEM CD) 120 MG 24 hr capsule Take 120 mg by mouth daily.     donepezil (ARICEPT) 5 MG tablet TAKE 1 TABLET BY MOUTH AT  BEDTIME 90 tablet 0   FLUOCINOLONE ACETONIDE SCALP EX Apply 1 application  topically 3 (three) times a week.     gabapentin (NEURONTIN) 300 MG capsule Take 1 capsule by mouth at bedtime only. (Patient taking differently: Take 300 mg by mouth at bedtime. ) 30 capsule 0   LORazepam (ATIVAN) 1 MG tablet Take 1 mg by mouth 2 (two) times daily as needed for anxiety.     Omega-3 Fatty Acids (FISH OIL) 1200 MG CAPS Take 1,200 mg by mouth daily.      polyethylene glycol (MIRALAX) 17 g packet Take 17 g by mouth daily. Dissolve one cap full in solution (water, gatorade, etc.) and administer once cap-full daily. You may titrate up daily by 1 cap-full until the patient is having pudding consistency of stools. After the patient is able to start passing softer stools they will need to be on 1/2 cap-full daily for 2 weeks. (Patient taking differently: Take 17 g by mouth daily as needed for mild constipation. ) 14 each 0   QUEtiapine (SEROQUEL) 25 MG tablet Take 25 mg by mouth at bedtime.   6   No facility-administered medications prior to visit.    PAST MEDICAL  HISTORY: Past Medical History:  Diagnosis Date   Anxiety    Fibromyalgia    GERD (gastroesophageal reflux disease)    High blood pressure    HSV (herpes simplex virus) anogenital infection    IBS (irritable bowel syndrome)    Memory loss    Neuropathy    Obesity (BMI 30-39.9) 04/01/2014   Osteoarthritis    Overactive bladder    Restless leg syndrome    Sleep apnea     PAST SURGICAL HISTORY: Past Surgical History:  Procedure Laterality Date   APPENDECTOMY     CATARACT EXTRACTION Left    CESAREAN SECTION     CHOLECYSTECTOMY     colonscopy     MULTIPLE TOOTH EXTRACTIONS     ROTATOR CUFF REPAIR Left    TOTAL KNEE ARTHROPLASTY      FAMILY HISTORY: Family History  Problem Relation Age of Onset   Depression Mother    Depression Father    Mental illness Sister    Mental illness Sister     SOCIAL HISTORY: Social History   Socioeconomic History   Marital status: Married    Spouse name: Dimas AguasHoward   Number of  children: 2   Years of education: college   Highest education level: Not on file  Occupational History    Comment: retired  Tobacco Use   Smoking status: Never   Smokeless tobacco: Never  Vaping Use   Vaping Use: Never used  Substance and Sexual Activity   Alcohol use: Not Currently    Comment: wine occas   Drug use: No   Sexual activity: Not on file  Other Topics Concern   Not on file  Social History Narrative   [name] and she is retired. Patient has 2 years of college. Patient has 2 children.    Social Determinants of Health   Financial Resource Strain: Not on file  Food Insecurity: Not on file  Transportation Needs: Not on file  Physical Activity: Not on file  Stress: Not on file  Social Connections: Not on file  Intimate Partner Violence: Not on file   PHYSICAL EXAM  There were no vitals filed for this visit.  There is no height or weight on file to calculate BMI.  Generalized: Well developed, in no acute distress  MMSE - Mini Mental State Exam 09/19/2019 09/18/2018 05/30/2017  Orientation to time 5 1 5   Orientation to Place 5 5 4   Registration 3 0 3  Attention/ Calculation 5 5 3   Attention/Calculation-comments - - -  Recall 3 1 2   Language- name 2 objects 2 2 2   Language- repeat 1 1 1   Language- follow 3 step command 3 2 2   Language- read & follow direction 1 1 1   Write a sentence 1 1 0  Copy design 1 1 1   Copy design-comments - 8 animals named -  Total score 30 20 24     Neurological examination  Mentation: Alert oriented to time, place, history taking. Follows all commands speech and language fluent Cranial nerve II-XII: Pupils were equal round reactive to light. Extraocular movements were full, visual field were full on confrontational test. Facial sensation and strength were normal. Head turning and shoulder shrug  were normal and symmetric. Motor: The motor testing reveals 5 over 5 strength of all 4 extremities.  Good symmetric motor tone is noted throughout.  Sensory: Sensory testing is intact to soft touch on all 4 extremities. No evidence of extinction is noted.  Coordination: Cerebellar testing reveals good finger-nose-finger and heel-to-shin bilaterally.  Gait and station: Gait is slightly wide-based, but steady. Reflexes: Deep tendon reflexes are symmetric and normal bilaterally.   DIAGNOSTIC DATA (LABS, IMAGING, TESTING) - I reviewed patient records, labs, notes, testing and imaging myself where available.  Lab Results  Component Value Date   WBC 11.2 (H) 06/17/2020   HGB 11.9 (L) 06/17/2020   HCT 37.1 06/17/2020   MCV 96.9 06/17/2020   PLT 190 06/17/2020      Component Value Date/Time   NA 137 06/17/2020 0314   K 3.7 06/17/2020 0314   CL 101 06/17/2020 0314   CO2 28 06/17/2020 0314   GLUCOSE 116 (H) 06/17/2020 0314   BUN 18 06/17/2020 0314   CREATININE 0.97 06/17/2020 0314   CALCIUM 9.4 06/17/2020 0314   PROT 7.2 06/08/2019 2033   ALBUMIN 4.0 06/08/2019 2033   AST 19 06/08/2019 2033   ALT 14 06/08/2019 2033   ALKPHOS 76 06/08/2019 2033   BILITOT 0.5 06/08/2019 2033   GFRNONAA 59 (L) 06/17/2020 0314   GFRAA >60 07/18/2019 1056   No results found for: CHOL, HDL, LDLCALC, LDLDIRECT, TRIG, CHOLHDL No results found for: EZMO2H No results found for: VITAMINB12 No results found for: TSH    ASSESSMENT AND PLAN 80 y.o. year old female  has a past medical history of Anxiety, Fibromyalgia, GERD (gastroesophageal reflux disease), High blood pressure, HSV (herpes simplex virus) anogenital infection, IBS (irritable bowel syndrome), Memory loss, Neuropathy, Obesity (BMI 30-39.9) (04/01/2014), Osteoarthritis, Overactive bladder, Restless leg syndrome, and Sleep apnea. here with:  1.  Mild cognitive impairment -MMSE 30/30 today -Continue Aricept 5 mg at bedtime, will stay at current dose, previously had side effect of stomach upset, weight loss -Now living at independent living  apartment -Also taking Seroquel 25 mg at bedtime from PCP, denies hallucinations, thinks it helps her sleep -Encouraged to get back to doing activities she enjoys   2.  Bilateral lumbar radiculopathy -No longer taking gabapentin, sleeping well, no falls -Will continue follow-up with PCP, follow-up here in 1 year or sooner if needed  I spent 30 minutes of face-to-face and non-face-to-face time with patient.  This included previsit chart review, lab review, study review, order entry, electronic health record documentation, patient education.  Margie Ege, AGNP-C, DNP 09/21/2020, 1:13 PM [facility] [address], [address] [place], [place] [address] [phone]

## 2020-09-22 ENCOUNTER — Ambulatory Visit: Payer: Medicare Other | Admitting: Neurology

## 2020-09-23 ENCOUNTER — Other Ambulatory Visit: Payer: Self-pay | Admitting: Neurology

## 2020-10-14 ENCOUNTER — Encounter: Payer: Self-pay | Admitting: Adult Health

## 2020-10-14 ENCOUNTER — Ambulatory Visit: Payer: Medicare Other | Admitting: Adult Health

## 2020-10-14 DIAGNOSIS — E7800 Pure hypercholesterolemia, unspecified: Secondary | ICD-10-CM | POA: Diagnosis not present

## 2020-10-14 DIAGNOSIS — I129 Hypertensive chronic kidney disease with stage 1 through stage 4 chronic kidney disease, or unspecified chronic kidney disease: Secondary | ICD-10-CM | POA: Diagnosis not present

## 2020-10-14 DIAGNOSIS — N183 Chronic kidney disease, stage 3 unspecified: Secondary | ICD-10-CM | POA: Diagnosis not present

## 2020-10-14 NOTE — Progress Notes (Deleted)
[name] DOB: [birth year]  REASON FOR VISIT: follow up HISTORY FROM: patient  HISTORY OF PRESENT ILLNESS: Today 10/14/20  HISTORY  Lynn Price, 80 year old female was referred by her primary care physician Dr. Blair Heys for evaluation of memory trouble. She was last seen in June 2015 by [name] for memory loss   She has a history of fibromyalgia hypertension, anxiety, obstructive sleep apnea using CPAP and short-term memory trouble. She also has a history of bilateral feet paresthesias.  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  She denies significant family history of dementia    MRI of the brain 07/11/2011 shows mild changes of chronic microvascular ischemia and generalized cerebral atrophy.     The patient continues to drive and has gotten lost in unfamiliar surroundings. She continues to be independent with her activities of daily living. She plays golf intermittently otherwise she gets no regular exercise. She has not had any falls, long-standing history of frequency incontinence and on oxybutynin. Her Mini-Mental Status exam is stable. She returns for reevaluation   UPDATE June 6th 2016. She is with her husband at today's clinical visit, she continue has mild slow worsening memory trouble, she continued to drive short distances in a familiar route, she visit her friends, go to church regularly, able to clean house, independent at daily activity,   She also complains of bilateral feet numbness tingling, slow worsening, she is taking gabapentin 300 mg 2 tablets in the morning 3 tablets every night, does make her feel tired and sleepy,   She has not used her CPAP machine for a while, tends to go to bed late, take long nap in the afternoon,   She also complains of being fatigued, lack of energy   UPDATE August 30 2017:   She is accompanied by her husband at today's clinical visit, she has been followed by our  office regularly for memory loss, is referred back by her primary care physician Dr. Beverley Fiedler for evaluation of worsening low back pain, gait abnormality,   She had history of chronic low back pain, gradually getting worse, radiating pain to left lower extremity, bilateral leg numbness, weakness, left worse than right, has tripped and fell few times, is receiving epidural injection which has helped her low back pain, she also complains of occasional knee bowel and bladder incontinence, frequent urinary urgency.   Personally reviewed MRI of lumbar in June 23, 2017, there is evidence of multilevel degenerative changes, progression of right-sided disc protrusion L4-5 with expected impingement of right L5 nerve roots, diffuse endplate spurring, right greater than left L5-S1, are exchanged with subarticular foraminal stenosis, right greater than left   Update 09/18/2018 SS: Lynn Price is a 80 year old female with history of memory disturbance and bilateral lumbar radiculopathy. She presents today for follow-up accompanied by her granddaughter.  She indicates her memory is fair.  She lives with her husband, she is able to perform her ADLs, manages cooking, finances with her husband.  She has a good appetite. She manages her medications in a weekly pillbox. Occasionally she may miss a dose of medicine.  She is able to drive short distances only.  She recently had eye surgery.  She does not have any family nearby, but her family rotates in and out to visit.  Unfortunately, her husband has developed memory trouble and his is more progressive.  She is having to care for him.  At times she  reports she may hear a man on the porch, but knows he is not here.  She does follow with Dr. Cleophas Dunker for chronic back pain, she says she completed physical therapy earlier this year and it was beneficial.  She indicates that her chronic low back pain has improved, she reports some weakness in her legs.  She denies bowel or  bladder incontinence.  She has not had an epidural injection in a while, she uses max freeze on her back.  She is considering assisted living for her and her husband.  Update September 19, 2019 SS: Here today alone, "family falling apart", son got sick-had surgery [place], husband is in memory care. She lives in Independent Living at "[place]" apartment. Is still driving, doing this well. Memory is stable, MMSE 30/30 today. Her health is up and down, ER visits for constipation, dehydration. Has psoriasis in scalp, seeing Dermatology. She manages her ADLs, household affairs, has a lot of paperwork to do related to finances, her sisters helps her with this. Hasn't been able to do much she enjoys (walking, playing games) because busy with stock papers. Takes Seroquel at night for sleep, sleeps well, denies hallucinations. Appetite is good. No falls recently. Has arthritis in her shoulders, doing well with aricept 5 mg, no side effects.  Here today unaccompanied.  Update 10/14/2020 JM: Returns for 1 year follow-up.  Overall doing well.  Memory stable, MMSE today *** (prior 30/30).  Remains on Aricept 5 mg nightly without side effects.  Remains on Seroquel for sleep managed by PCP.  Continues to reside at independent living at [place] apartment.  Maintains ADLs independently majority of IADLs ***.  Continues to drive without difficulty.        REVIEW OF SYSTEMS: Out of a complete 14 system review of symptoms, the patient complains only of the following symptoms, and all other reviewed systems are negative.  Memory loss  ALLERGIES: Allergies  Allergen Reactions   Erythromycin Rash   Penicillins Rash    Remote reaction, no medical attention required No cutaneous or anaphylactic symptoms Tolerates Keflex   Sulfa Antibiotics Rash   Tetracyclines & Related Rash    HOME MEDICATIONS: Outpatient Medications Prior to Visit  Medication Sig Dispense Refill   acetaminophen (TYLENOL) 325 MG tablet Take 325 mg by  mouth See admin instructions. 325 mg twice a day and can take every 6 hours as needed     acyclovir (ZOVIRAX) 400 MG tablet Take 400 mg by mouth 2 (two) times daily.      diltiazem (CARDIZEM CD) 120 MG 24 hr capsule Take 120 mg by mouth daily.     donepezil (ARICEPT) 5 MG tablet TAKE 1 TABLET BY MOUTH AT  BEDTIME 90 tablet 3   FLUOCINOLONE ACETONIDE SCALP EX Apply 1 application topically 3 (three) times a week.     gabapentin (NEURONTIN) 300 MG capsule Take 1 capsule by mouth at bedtime only. (Patient taking differently: Take 300 mg by mouth at bedtime. ) 30 capsule 0   LORazepam (ATIVAN) 1 MG tablet Take 1 mg by mouth 2 (two) times daily as needed for anxiety.     Omega-3 Fatty Acids (FISH OIL) 1200 MG CAPS Take 1,200 mg by mouth daily.      polyethylene glycol (MIRALAX) 17 g packet Take 17 g by mouth daily. Dissolve one cap full in solution (water, gatorade, etc.) and administer once cap-full daily. You may titrate up daily by 1 cap-full until the patient is having pudding consistency of stools.  After the patient is able to start passing softer stools they will need to be on 1/2 cap-full daily for 2 weeks. (Patient taking differently: Take 17 g by mouth daily as needed for mild constipation. ) 14 each 0   QUEtiapine (SEROQUEL) 25 MG tablet Take 25 mg by mouth at bedtime.   6   No facility-administered medications prior to visit.    PAST MEDICAL HISTORY: Past Medical History:  Diagnosis Date   Anxiety    Fibromyalgia    GERD (gastroesophageal reflux disease)    High blood pressure    HSV (herpes simplex virus) anogenital infection    IBS (irritable bowel syndrome)    Memory loss    Neuropathy    Obesity (BMI 30-39.9) 04/01/2014   Osteoarthritis    Overactive bladder    Restless leg syndrome    Sleep apnea     PAST SURGICAL HISTORY: Past Surgical History:  Procedure Laterality Date   APPENDECTOMY     CATARACT EXTRACTION Left    CESAREAN SECTION     CHOLECYSTECTOMY     colonscopy      MULTIPLE TOOTH EXTRACTIONS     ROTATOR CUFF REPAIR Left    TOTAL KNEE ARTHROPLASTY      FAMILY HISTORY: Family History  Problem Relation Age of Onset   Depression Mother    Depression Father    Mental illness Sister    Mental illness Sister     SOCIAL HISTORY: Social History   Socioeconomic History   Marital status: Married    Spouse name: Dimas AguasHoward   Number of children: 2   Years of education: college   Highest education level: Not on file  Occupational History    Comment: retired  Tobacco Use   Smoking status: Never   Smokeless tobacco: Never  Vaping Use   Vaping Use: Never used  Substance and Sexual Activity   Alcohol use: Not Currently    Comment: wine occas   Drug use: No   Sexual activity: Not on file  Other Topics Concern   Not on file  Social History Narrative   [name] and she is retired. Patient has 2 years of college. Patient has 2 children.    Social Determinants of Health   Financial Resource Strain: Not on file  Food Insecurity: Not on file  Transportation Needs: Not on file  Physical Activity: Not on file  Stress: Not on file  Social Connections: Not on file  Intimate Partner Violence: Not on file   PHYSICAL EXAM  There were no vitals filed for this visit.  There is no height or weight on file to calculate BMI.  Generalized: Well developed, in no acute distress  MMSE - Mini Mental State Exam 09/19/2019 09/18/2018 05/30/2017  Orientation to time 5 1 5   Orientation to Place 5 5 4   Registration 3 0 3  Attention/ Calculation 5 5 3   Attention/Calculation-comments - - -  Recall 3 1 2   Language- name 2 objects 2 2 2   Language- repeat 1 1 1   Language- follow 3 step command 3 2 2   Language- read & follow direction 1 1 1   Write a sentence 1 1 0  Copy design 1 1 1   Copy design-comments - 8 animals named -  Total score 30 20 24     Neurological examination  Mentation: Alert oriented to time, place,  history taking. Follows all commands speech and language fluent Cranial nerve II-XII: Pupils were equal  round reactive to light. Extraocular movements were full, visual field were full on confrontational test. Facial sensation and strength were normal. Head turning and shoulder shrug  were normal and symmetric. Motor: The motor testing reveals 5 over 5 strength of all 4 extremities. Good symmetric motor tone is noted throughout.  Sensory: Sensory testing is intact to soft touch on all 4 extremities. No evidence of extinction is noted.  Coordination: Cerebellar testing reveals good finger-nose-finger and heel-to-shin bilaterally.  Gait and station: Gait is slightly wide-based, but steady. Reflexes: Deep tendon reflexes are symmetric and normal bilaterally.   DIAGNOSTIC DATA (LABS, IMAGING, TESTING) - I reviewed patient records, labs, notes, testing and imaging myself where available.  Lab Results  Component Value Date   WBC 11.2 (H) 06/17/2020   HGB 11.9 (L) 06/17/2020   HCT 37.1 06/17/2020   MCV 96.9 06/17/2020   PLT 190 06/17/2020      Component Value Date/Time   NA 137 06/17/2020 0314   K 3.7 06/17/2020 0314   CL 101 06/17/2020 0314   CO2 28 06/17/2020 0314   GLUCOSE 116 (H) 06/17/2020 0314   BUN 18 06/17/2020 0314   CREATININE 0.97 06/17/2020 0314   CALCIUM 9.4 06/17/2020 0314   PROT 7.2 06/08/2019 2033   ALBUMIN 4.0 06/08/2019 2033   AST 19 06/08/2019 2033   ALT 14 06/08/2019 2033   ALKPHOS 76 06/08/2019 2033   BILITOT 0.5 06/08/2019 2033   GFRNONAA 59 (L) 06/17/2020 0314   GFRAA >60 07/18/2019 1056   No results found for: CHOL, HDL, LDLCALC, LDLDIRECT, TRIG, CHOLHDL No results found for: LTJQ3E No results found for: VITAMINB12 No results found for: TSH    ASSESSMENT AND PLAN 80 y.o. year old female  has a past medical history of Anxiety, Fibromyalgia, GERD (gastroesophageal reflux disease), High blood pressure, HSV (herpes simplex virus) anogenital infection, IBS  (irritable bowel syndrome), Memory loss, Neuropathy, Obesity (BMI 30-39.9) (04/01/2014), Osteoarthritis, Overactive bladder, Restless leg syndrome, and Sleep apnea. here with:  1.  Mild cognitive impairment -MMSE 30/30 today -Continue Aricept 5 mg at bedtime, will stay at current dose, previously had side effect of stomach upset, weight loss -Now living at independent living apartment -Also taking Seroquel 25 mg at bedtime from PCP, denies hallucinations, thinks it helps her sleep -Encouraged to get back to doing activities she enjoys   2.  Bilateral lumbar radiculopathy -No longer taking gabapentin, sleeping well, no falls -Will continue follow-up with PCP, follow-up here in 1 year or sooner if needed   Follow-up with Maralyn Sago, NP in 1 year or call earlier if needed    CC:  [facility] provider: Dr. Gwendolyn Fill, Molly Maduro, MD   I spent *** minutes of face-to-face and non-face-to-face time with patient.  This included previsit chart review, lab review, study review, order entry, electronic health record documentation, patient education  Ihor Austin, [facility]  [facility] [address] [place], [place] [address]  Phone [phone] Fax [phone] Note: This document was prepared with digital dictation and possible smart phrase technology. Any transcriptional errors that result from this process are unintentional.

## 2020-10-15 DIAGNOSIS — K5900 Constipation, unspecified: Secondary | ICD-10-CM | POA: Diagnosis not present

## 2020-10-15 DIAGNOSIS — K30 Functional dyspepsia: Secondary | ICD-10-CM | POA: Diagnosis not present

## 2020-10-15 DIAGNOSIS — R42 Dizziness and giddiness: Secondary | ICD-10-CM | POA: Diagnosis not present

## 2020-12-09 ENCOUNTER — Encounter: Payer: Self-pay | Admitting: Adult Health

## 2020-12-09 ENCOUNTER — Ambulatory Visit: Payer: Medicare Other | Admitting: Adult Health

## 2020-12-09 VITALS — BP 147/76 | HR 73 | Ht 65.0 in | Wt 113.2 lb

## 2020-12-09 DIAGNOSIS — F418 Other specified anxiety disorders: Secondary | ICD-10-CM

## 2020-12-09 DIAGNOSIS — G3184 Mild cognitive impairment, so stated: Secondary | ICD-10-CM

## 2020-12-09 DIAGNOSIS — R4189 Other symptoms and signs involving cognitive functions and awareness: Secondary | ICD-10-CM

## 2020-12-09 NOTE — Patient Instructions (Signed)
Your Plan:  Continue Aricept 5 mg nightly for mild cognitive impairment  We will check a dementia panel today to look for reversible causes of memory loss  You will be contacted by behavioral health for depression.anxiety    Follow up in 6 months with Maralyn Sago, NP or call earlier if needed     Thank you for coming to see [place] at [facility]. I hope we have been able to provide you high quality care today.  You may receive a patient satisfaction survey over the next few weeks. We would appreciate your feedback and comments so that we may continue to improve ourselves and the health of our patients.

## 2020-12-09 NOTE — Progress Notes (Signed)
[name] DOB: 1941/01/25  REASON FOR VISIT: follow up HISTORY FROM: patient   Chief Complaint  Patient presents with   Memory Loss    Rm 3, one year FU, grandson- Lynn Price  MMSE 22 " I seem to be a little more forgetful but lost my husband and sister, lots of paperwork to do, still driving some when not busy time of day, have gotten lost driving"      HISTORY OF PRESENT ILLNESS: Today 12/09/20  HISTORY  Mrs.Lynn Price, 80 year old female was referred by her primary care physician Dr. Blair Heys for evaluation of memory trouble. She was last seen in June 2015 by [name] for memory loss   She has a history of fibromyalgia hypertension, anxiety, obstructive sleep apnea using CPAP and short-term memory trouble. She also has a history of bilateral feet paresthesias.  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  She denies significant family history of dementia    MRI of the brain 07/11/2011 shows mild changes of chronic microvascular ischemia and generalized cerebral atrophy.     The patient continues to drive and has gotten lost in unfamiliar surroundings. She continues to be independent with her activities of daily living. She plays golf intermittently otherwise she gets no regular exercise. She has not had any falls, long-standing history of frequency incontinence and on oxybutynin. Her Mini-Mental Status exam is stable. She returns for reevaluation   UPDATE June 6th 2016. She is with her husband at today's clinical visit, she continue has mild slow worsening memory trouble, she continued to drive short distances in a familiar route, she visit her friends, go to church regularly, able to clean house, independent at daily activity,   She also complains of bilateral feet numbness tingling, slow worsening, she is taking gabapentin 300 mg 2 tablets in the morning 3 tablets every night, does make her feel tired and sleepy,    She has not used her CPAP machine for a while, tends to go to bed late, take long nap in the afternoon,   She also complains of being fatigued, lack of energy   UPDATE August 30 2017:   She is accompanied by her husband at today's clinical visit, she has been followed by our office regularly for memory loss, is referred back by her primary care physician Dr. Beverley Fiedler for evaluation of worsening low back pain, gait abnormality,   She had history of chronic low back pain, gradually getting worse, radiating pain to left lower extremity, bilateral leg numbness, weakness, left worse than right, has tripped and fell few times, is receiving epidural injection which has helped her low back pain, she also complains of occasional knee bowel and bladder incontinence, frequent urinary urgency.   Personally reviewed MRI of lumbar in June 23, 2017, there is evidence of multilevel degenerative changes, progression of right-sided disc protrusion L4-5 with expected impingement of right L5 nerve roots, diffuse endplate spurring, right greater than left L5-S1, are exchanged with subarticular foraminal stenosis, right greater than left   Update 09/18/2018 SS: [name] is a 80 year old female with history of memory disturbance and bilateral lumbar radiculopathy. She presents today for follow-up accompanied by her granddaughter.  She indicates her memory is fair.  She lives with her husband, she is able to perform her ADLs, manages cooking, finances with her husband.  She has a good appetite. She manages her medications in a weekly pillbox. Occasionally she may  miss a dose of medicine.  She is able to drive short distances only.  She recently had eye surgery.  She does not have any family nearby, but her family rotates in and out to visit.  Unfortunately, her husband has developed memory trouble and his is more progressive.  She is having to care for him.  At times she reports she may hear a man on the porch, but  knows he is not here.  She does follow with Dr. Cleophas Dunker for chronic back pain, she says she completed physical therapy earlier this year and it was beneficial.  She indicates that her chronic low back pain has improved, she reports some weakness in her legs.  She denies bowel or bladder incontinence.  She has not had an epidural injection in a while, she uses max freeze on her back.  She is considering assisted living for her and her husband.  Update September 19, 2019 SS: Here today alone, "family falling apart", son got sick-had surgery [place], husband is in memory care. She lives in Independent Living at "[facility]" apartment. Is still driving, doing this well. Memory is stable, MMSE 30/30 today. Her health is up and down, ER visits for constipation, dehydration. Has psoriasis in scalp, seeing Dermatology. She manages her ADLs, household affairs, has a lot of paperwork to do related to finances, her sisters helps her with this. Hasn't been able to do much she enjoys (walking, playing games) because busy with stock papers. Takes Seroquel at night for sleep, sleeps well, denies hallucinations. Appetite is good. No falls recently. Has arthritis in her shoulders, doing well with aricept 5 mg, no side effects.  Here today unaccompanied.   Update 12/09/2020 JM: Returns for yearly follow-up accompanied by grandson.  Feels like memory has declined since prior visit. Her husband passed away December 04, 2019 and sister passed away this past 03-Sep-2022.  Her son lives in [place] who is currently waiting for kidney transplant.  Lives in independent living at [facility]. Feels depression/anxiety. Reports social isolation but has been gradually increasing activity at [facility].  She speaks having to do a lot of paperwork throughout the visit.  Her sister used to assist her with this.  She did have a private duty caregiver over the past 1.5 years but has not recently been able to work with her due to her own health issues.  Will drive short distance only.  Avoids main roads and high ways. Did get lost while driving on the high way back in 09/03/2022 but no difficulty driving shorter distances.  Appetite good.  Denies any recent falls.  Remains on Aricept 5 mg nightly tolerating without side effects.  MMSE today 22/30 (prior 30/30) with deficits in attention and calculation, recall and stating today is Thursday (its Wednesday) and the date is the 22nd (its the 12th)  At the end of the visit, grandson mentions that she will call him multiple times throughout the day but will forget that she recently spoke to him and has been perseverating on paperwork.   He then provides a 4 page letter from the caregiver listing specific concerns:  1) gotten lost while driving 3 times in all 3 times police have been called to find her while traveling on the highway -she does not believe she should be driving anymore.  2) has needed assistance regarding all matters pertaining to her deceased husband's estate and has difficulty keeping track of appointments or what the appointments are for 3) "great difficulty operating a cell phone including  a flip phone even to make a simple phone call.  She calls people over and over again somehow" 4) careless with checks and money -carrying them around and losing them. 5) spends hours picking up stacks of nail and putting them down just to revisit them the next day 6) takes her a long time to get ready to go anywhere and usually misses or is late for where she is going       REVIEW OF SYSTEMS: Out of a complete 14 system review of symptoms, the patient complains only of the following symptoms, and all other reviewed systems are negative.  Memory loss Anxiety and depression    ALLERGIES: Allergies  Allergen Reactions   Erythromycin Rash   Penicillins Rash    Remote reaction, no medical attention required No cutaneous or anaphylactic symptoms Tolerates Keflex   Sulfa Antibiotics Rash   Tetracyclines & Related Rash    HOME  MEDICATIONS: Outpatient Medications Prior to Visit  Medication Sig Dispense Refill   acetaminophen (TYLENOL) 325 MG tablet Take 325 mg by mouth See admin instructions. 325 mg twice a day and can take every 6 hours as needed     acyclovir (ZOVIRAX) 400 MG tablet Take 400 mg by mouth 2 (two) times daily.      diltiazem (CARDIZEM CD) 120 MG 24 hr capsule Take 120 mg by mouth daily.     donepezil (ARICEPT) 5 MG tablet TAKE 1 TABLET BY MOUTH AT  BEDTIME 90 tablet 3   FLUOCINOLONE ACETONIDE SCALP EX Apply 1 application topically 3 (three) times a week.     gabapentin (NEURONTIN) 300 MG capsule Take 1 capsule by mouth at bedtime only. (Patient taking differently: Take 300 mg by mouth at bedtime.) 30 capsule 0   LORazepam (ATIVAN) 1 MG tablet Take 1 mg by mouth 2 (two) times daily as needed for anxiety.     Omega-3 Fatty Acids (FISH OIL) 1200 MG CAPS Take 1,200 mg by mouth daily.      polyethylene glycol (MIRALAX) 17 g packet Take 17 g by mouth daily. Dissolve one cap full in solution (water, gatorade, etc.) and administer once cap-full daily. You may titrate up daily by 1 cap-full until the patient is having pudding consistency of stools. After the patient is able to start passing softer stools they will need to be on 1/2 cap-full daily for 2 weeks. (Patient taking differently: Take 17 g by mouth daily as needed for mild constipation.) 14 each 0   QUEtiapine (SEROQUEL) 25 MG tablet Take 25 mg by mouth at bedtime.   6   No facility-administered medications prior to visit.    PAST MEDICAL HISTORY: Past Medical History:  Diagnosis Date   Anxiety    Fibromyalgia    GERD (gastroesophageal reflux disease)    High blood pressure    HSV (herpes simplex virus) anogenital infection    IBS (irritable bowel syndrome)    Memory loss    Neuropathy    Obesity (BMI 30-39.9) 04/01/2014   Osteoarthritis    Overactive bladder    Restless leg syndrome    Sleep apnea     PAST SURGICAL HISTORY: Past Surgical  History:  Procedure Laterality Date   APPENDECTOMY     CATARACT EXTRACTION Left    CESAREAN SECTION     CHOLECYSTECTOMY     colonscopy     MULTIPLE TOOTH EXTRACTIONS     ROTATOR CUFF REPAIR Left    TOTAL KNEE ARTHROPLASTY  FAMILY HISTORY: Family History  Problem Relation Age of Onset   Depression Mother    Depression Father    Mental illness Sister    Mental illness Sister     SOCIAL HISTORY: Social History   Socioeconomic History   Marital status: Widowed    Spouse name: Dimas Aguas   Number of children: 2   Years of education: college   Highest education level: Associate degree: academic program  Occupational History    Comment: retired  Tobacco Use   Smoking status: Never   Smokeless tobacco: Never  Vaping Use   Vaping Use: Never used  Substance and Sexual Activity   Alcohol use: Not Currently    Comment: wine occas   Drug use: No   Sexual activity: Not on file  Other Topics Concern   Not on file  Social History Narrative   Patient passed away 12/13/19, and she is retired. Patient has 2 years of college. Patient has 2 children.   12/09/20 lives at [facility], independent living     Social Determinants of Health   Financial Resource Strain: Not on file  Food Insecurity: Not on file  Transportation Needs: Not on file  Physical Activity: Not on file  Stress: Not on file  Social Connections: Not on file  Intimate Partner Violence: Not on file   PHYSICAL EXAM  Vitals:   12/09/20 1507  BP: (!) 147/76  Pulse: 73  Weight: 113 lb 3.2 oz (51.3 kg)  Height: 5\' 5"  (1.651 m)   Body mass index is 18.84 kg/m.  Generalized: Frail pleasant elderly Caucasian female tearful throughout visit MMSE - Mini Mental State Exam 12/09/2020 09/19/2019 09/18/2018  Orientation to time 3 5 1   Orientation to Place 5 5 5   Registration 3 3 0  Attention/ Calculation 0 5 5  Attention/Calculation-comments - - -  Recall 2 3 1   Language- name 2 objects 2 2 2   Language- repeat 1  1 1   Language- follow 3 step command 3 3 2   Language- read & follow direction 1 1 1   Write a sentence 1 1 1   Copy design 1 1 1   Copy design-comments - - 8 animals named  Total score 22 30 20     Neurological examination  Mentation: Alert oriented to time, place, history taking. Follows all commands speech and language fluent Cranial nerve II-XII: Pupils were equal round reactive to light. Extraocular movements were full, visual field were full on confrontational test. Facial sensation and strength were normal. Head turning and shoulder shrug  were normal and symmetric. Motor: The motor testing reveals 5 over 5 strength of all 4 extremities. Good symmetric motor tone is noted throughout.  Sensory: Sensory testing is intact to soft touch on all 4 extremities. No evidence of extinction is noted.  Coordination: Cerebellar testing reveals good finger-nose-finger and heel-to-shin bilaterally.  Gait and station: Gait is slightly wide-based, but steady. Reflexes: Deep tendon reflexes are symmetric and normal bilaterally.    DIAGNOSTIC DATA (LABS, IMAGING, TESTING) - I reviewed patient records, labs, notes, testing and imaging myself where available.  Lab Results  Component Value Date   WBC 11.2 (H) 06/17/2020   HGB 11.9 (L) 06/17/2020   HCT 37.1 06/17/2020   MCV 96.9 06/17/2020   PLT 190 06/17/2020      Component Value Date/Time   NA 137 06/17/2020 0314   K 3.7 06/17/2020 0314   CL 101 06/17/2020 0314   CO2 28 06/17/2020 0314   GLUCOSE 116 (H) 06/17/2020  0314   BUN 18 06/17/2020 0314   CREATININE 0.97 06/17/2020 0314   CALCIUM 9.4 06/17/2020 0314   PROT 7.2 06/08/2019 2033   ALBUMIN 4.0 06/08/2019 2033   AST 19 06/08/2019 2033   ALT 14 06/08/2019 2033   ALKPHOS 76 06/08/2019 2033   BILITOT 0.5 06/08/2019 2033   GFRNONAA 59 (L) 06/17/2020 0314   GFRAA >60 07/18/2019 1056   No results found for: CHOL, HDL, LDLCALC, LDLDIRECT, TRIG, CHOLHDL No results found for: SWFU9N No  results found for: VITAMINB12 No results found for: TSH  MR BRAIN WO CONTRAST 07/08/2011 IMPRESSION: This MRI scan of the brain shows mild changes of chronic microvascular ischemia and generalized cerebral atrophy      ASSESSMENT AND PLAN 80 y.o. year old female  has a past medical history of Anxiety, Fibromyalgia, GERD (gastroesophageal reflux disease), High blood pressure, HSV (herpes simplex virus) anogenital infection, IBS (irritable bowel syndrome), Memory loss, Neuropathy, Obesity (BMI 30-39.9) (04/01/2014), Osteoarthritis, Overactive bladder, Restless leg syndrome, and Sleep apnea. here with:  1.  Mild cognitive impairment 2.  Cognitive decline 3.  Depression with anxiety -Concern worsening cognition likely in setting of great stressors with the loss of her husband a little over 1 year ago and her sister 4 months ago but will obtain MRI brain w/wo contrast to rule other causes as well as dementia panel to rule out reversible causes -Referral placed to behavioral health to establish care with psychology -MMSE 22/30 today (prior 30/30) -Continue Aricept 5 mg at bedtime, will stay at current dose, previously had side effect of stomach upset, weight loss -Now living at independent living apartment -On Seroquel 25 mg at bedtime from PCP to help with sleep -Encouraged to get back to doing activities she enjoys and being active at her ILR -discussed importance of social interaction and keeping the mind busy   Follow-up in 6 months with Maralyn Sago, NP or call earlier if needed   CC:  [facility] provider: Dr. Gwendolyn Fill, Molly Maduro, MD   I spent 43 minutes of face-to-face and non-face-to-face time with patient and grandson.  This included previsit chart review, lab review, study review, order entry, electronic health record documentation, patient and grandson education and prolonged discussion regarding cognitive decline and possible contributing factors, indication for further evaluation with imaging  and lab work, underlying depression/anxiety, completion and review of MMSE and answered all her questions to patient and grandson satisfaction  Ihor Austin, [facility]  [facility] [address] [place], [place] [address]  Phone [phone] Fax [phone] Note: This document was prepared with digital dictation and possible smart phrase technology. Any transcriptional errors that result from this process are unintentional.

## 2020-12-10 ENCOUNTER — Encounter: Payer: Self-pay | Admitting: Adult Health

## 2020-12-10 NOTE — Telephone Encounter (Signed)
Error

## 2020-12-11 LAB — DEMENTIA PANEL
Homocysteine: 18.9 umol/L (ref 0.0–21.3)
RPR Ser Ql: NONREACTIVE
TSH: 2.98 u[IU]/mL (ref 0.450–4.500)
Vitamin B-12: 513 pg/mL (ref 232–1245)

## 2020-12-14 ENCOUNTER — Telehealth: Payer: Self-pay

## 2020-12-14 NOTE — Telephone Encounter (Signed)
-----   Message from Ihor Austin, NP sent at 12/14/2020  4:08 PM EDT ----- Please advise patient/family that recent lab all within normal limits.  Thank you.

## 2020-12-14 NOTE — Telephone Encounter (Signed)
Contacted pt, no answer Contacted son per DPR, informed him labs were all within normal limits. He was appreciative.

## 2020-12-16 NOTE — Telephone Encounter (Signed)
Patient called, returning a call while she wasn't home. I informed her that her labs are normal. She asked what was checked; I advised her. Patient verbalized understanding, appreciation.

## 2020-12-25 ENCOUNTER — Ambulatory Visit
Admission: RE | Admit: 2020-12-25 | Discharge: 2020-12-25 | Disposition: A | Discharge status: Home / Self Care | Payer: Medicare Other | Source: Ambulatory Visit | Attending: Adult Health | Admitting: Adult Health

## 2020-12-25 DIAGNOSIS — G3184 Mild cognitive impairment, so stated: Secondary | ICD-10-CM

## 2020-12-25 MED ORDER — GADOBENATE DIMEGLUMINE 529 MG/ML IV SOLN
11.0000 mL | Freq: Once | INTRAVENOUS | Status: AC | PRN
  Administered 2020-12-25: 11 mL via INTRAVENOUS

## 2020-12-29 ENCOUNTER — Telehealth: Payer: Self-pay | Admitting: *Deleted

## 2020-12-29 DIAGNOSIS — I129 Hypertensive chronic kidney disease with stage 1 through stage 4 chronic kidney disease, or unspecified chronic kidney disease: Secondary | ICD-10-CM | POA: Diagnosis not present

## 2020-12-29 DIAGNOSIS — E7800 Pure hypercholesterolemia, unspecified: Secondary | ICD-10-CM | POA: Diagnosis not present

## 2020-12-29 DIAGNOSIS — N183 Chronic kidney disease, stage 3 unspecified: Secondary | ICD-10-CM | POA: Diagnosis not present

## 2020-12-29 NOTE — Telephone Encounter (Signed)
Spoke with patient and informed her that recent MRI did show some progression of small vessel disease since prior imaging in 2013 and moderate shrinking of the brain which was also seen on prior imaging and can occur with age. No evidence of new strokes or lesions contributing to memory decline. She asked for a copy of MRI report. She doesn't have e mail. I advised I'll mail release to her to sign so she can get a copy mailed to her. Patient verbalized understanding, appreciation. Release form placed in outgoing mail.

## 2021-01-29 DIAGNOSIS — G629 Polyneuropathy, unspecified: Secondary | ICD-10-CM | POA: Diagnosis not present

## 2021-01-29 DIAGNOSIS — M797 Fibromyalgia: Secondary | ICD-10-CM | POA: Diagnosis not present

## 2021-01-29 DIAGNOSIS — R413 Other amnesia: Secondary | ICD-10-CM | POA: Diagnosis not present

## 2021-01-29 DIAGNOSIS — Z1389 Encounter for screening for other disorder: Secondary | ICD-10-CM | POA: Diagnosis not present

## 2021-01-29 DIAGNOSIS — Z23 Encounter for immunization: Secondary | ICD-10-CM | POA: Diagnosis not present

## 2021-01-29 DIAGNOSIS — Z0000 Encounter for general adult medical examination without abnormal findings: Secondary | ICD-10-CM | POA: Diagnosis not present

## 2021-01-29 DIAGNOSIS — I129 Hypertensive chronic kidney disease with stage 1 through stage 4 chronic kidney disease, or unspecified chronic kidney disease: Secondary | ICD-10-CM | POA: Diagnosis not present

## 2021-01-29 DIAGNOSIS — E7800 Pure hypercholesterolemia, unspecified: Secondary | ICD-10-CM | POA: Diagnosis not present

## 2021-01-29 DIAGNOSIS — K5900 Constipation, unspecified: Secondary | ICD-10-CM | POA: Diagnosis not present

## 2021-01-29 DIAGNOSIS — N183 Chronic kidney disease, stage 3 unspecified: Secondary | ICD-10-CM | POA: Diagnosis not present

## 2021-03-25 ENCOUNTER — Encounter (HOSPITAL_BASED_OUTPATIENT_CLINIC_OR_DEPARTMENT_OTHER): Payer: Self-pay | Admitting: Emergency Medicine

## 2021-03-25 ENCOUNTER — Emergency Department (HOSPITAL_BASED_OUTPATIENT_CLINIC_OR_DEPARTMENT_OTHER): Payer: Medicare Other

## 2021-03-25 ENCOUNTER — Emergency Department (HOSPITAL_BASED_OUTPATIENT_CLINIC_OR_DEPARTMENT_OTHER)
Admission: EM | Admit: 2021-03-25 | Discharge: 2021-03-25 | Disposition: A | Discharge status: Home / Self Care | Payer: Medicare Other | Attending: Emergency Medicine | Admitting: Emergency Medicine

## 2021-03-25 ENCOUNTER — Other Ambulatory Visit: Payer: Self-pay

## 2021-03-25 DIAGNOSIS — Z20822 Contact with and (suspected) exposure to covid-19: Secondary | ICD-10-CM | POA: Diagnosis not present

## 2021-03-25 DIAGNOSIS — E860 Dehydration: Secondary | ICD-10-CM | POA: Insufficient documentation

## 2021-03-25 DIAGNOSIS — R4182 Altered mental status, unspecified: Secondary | ICD-10-CM | POA: Insufficient documentation

## 2021-03-25 DIAGNOSIS — R197 Diarrhea, unspecified: Secondary | ICD-10-CM | POA: Diagnosis present

## 2021-03-25 DIAGNOSIS — R441 Visual hallucinations: Secondary | ICD-10-CM

## 2021-03-25 DIAGNOSIS — I10 Essential (primary) hypertension: Secondary | ICD-10-CM | POA: Diagnosis not present

## 2021-03-25 LAB — URINALYSIS, ROUTINE W REFLEX MICROSCOPIC
Bilirubin Urine: NEGATIVE
Glucose, UA: NEGATIVE mg/dL
Hgb urine dipstick: NEGATIVE
Ketones, ur: NEGATIVE mg/dL
Leukocytes,Ua: NEGATIVE
Nitrite: NEGATIVE
Specific Gravity, Urine: 1.014 (ref 1.005–1.030)
pH: 5.5 (ref 5.0–8.0)

## 2021-03-25 LAB — COMPREHENSIVE METABOLIC PANEL
ALT: 26 U/L (ref 0–44)
AST: 29 U/L (ref 15–41)
Albumin: 4.4 g/dL (ref 3.5–5.0)
Alkaline Phosphatase: 68 U/L (ref 38–126)
Anion gap: 7 (ref 5–15)
BUN: 19 mg/dL (ref 8–23)
CO2: 30 mmol/L (ref 22–32)
Calcium: 9.8 mg/dL (ref 8.9–10.3)
Chloride: 103 mmol/L (ref 98–111)
Creatinine, Ser: 1.18 mg/dL — ABNORMAL HIGH (ref 0.44–1.00)
GFR, Estimated: 46 mL/min — ABNORMAL LOW (ref >60)
Glucose, Bld: 91 mg/dL (ref 70–99)
Potassium: 4 mmol/L (ref 3.5–5.1)
Sodium: 140 mmol/L (ref 135–145)
Total Bilirubin: 0.4 mg/dL (ref 0.3–1.2)
Total Protein: 7.4 g/dL (ref 6.5–8.1)

## 2021-03-25 LAB — CBC WITH DIFFERENTIAL/PLATELET
Abs Immature Granulocytes: 0.03 10*3/uL (ref 0.00–0.07)
Basophils Absolute: 0.1 10*3/uL (ref 0.0–0.1)
Basophils Relative: 1 %
Eosinophils Absolute: 0.2 10*3/uL (ref 0.0–0.5)
Eosinophils Relative: 4 %
HCT: 36.1 % (ref 36.0–46.0)
Hemoglobin: 11.5 g/dL — ABNORMAL LOW (ref 12.0–15.0)
Immature Granulocytes: 1 %
Lymphocytes Relative: 28 %
Lymphs Abs: 1.4 10*3/uL (ref 0.7–4.0)
MCH: 30.8 pg (ref 26.0–34.0)
MCHC: 31.9 g/dL (ref 30.0–36.0)
MCV: 96.8 fL (ref 80.0–100.0)
Monocytes Absolute: 0.5 10*3/uL (ref 0.1–1.0)
Monocytes Relative: 9 %
Neutro Abs: 3 10*3/uL (ref 1.7–7.7)
Neutrophils Relative %: 57 %
Platelets: 189 10*3/uL (ref 150–400)
RBC: 3.73 MIL/uL — ABNORMAL LOW (ref 3.87–5.11)
RDW: 13.1 % (ref 11.5–15.5)
WBC: 5.2 10*3/uL (ref 4.0–10.5)
nRBC: 0 % (ref 0.0–0.2)

## 2021-03-25 LAB — CBG MONITORING, ED: Glucose-Capillary: 91 mg/dL (ref 70–99)

## 2021-03-25 LAB — RESP PANEL BY RT-PCR (FLU A&B, COVID) ARPGX2
Influenza A by PCR: NEGATIVE
Influenza B by PCR: NEGATIVE
SARS Coronavirus 2 by RT PCR: NEGATIVE

## 2021-03-25 MED ORDER — SODIUM CHLORIDE 0.9 % IV BOLUS
500.0000 mL | Freq: Once | INTRAVENOUS | Status: AC
  Administered 2021-03-25: 500 mL via INTRAVENOUS

## 2021-03-25 NOTE — ED Provider Notes (Signed)
°  Physical Exam  BP 135/74    Pulse 70    Temp 98.2 F (36.8 C)    Resp 20    Ht 5\' 5"  (1.651 m)    Wt 40.8 kg    SpO2 100%    BMI 14.97 kg/m   Physical Exam  Procedures  Procedures  ED Course / MDM    Medical Decision Making Amount and/or Complexity of Data Reviewed Radiology: ordered.   Received care of patient from Dr. Laverta Baltimore. Please see his note for prior hx, physical and care. Briefly this is an 81yo female who presents with concern for hallucinations.  Hemodynamically stable, labs without significant findings.  No sign of UTI. Stable for outpatient follow up. Suspect diarrheal illness and dehydration led to symptoms with history of dementia and prior hallucinations in the past.  Needs transfer to assisted living. FL2 filled out and transferred to to facility.        Gareth Morgan, MD 03/27/21 1128

## 2021-03-25 NOTE — ED Provider Notes (Signed)
Emergency Department Provider Note   I have reviewed the triage vital signs and the nursing notes.   HISTORY  Chief Complaint Diarrhea   HPI Lynn Price is a 81 y.o. female with a past medical history reviewed below including prior visual hallucinations and suspected dementia presents with her daughter.  She has had some increased visual hallucinations, seeing children in her room, over the past couple of days.  She recently recovered from a diarrheal illness.  She has not had diarrhea or vomiting over the past 2 days but staff had some concern that with the hallucination she may have developed a urinary tract infection or perhaps was dehydrated.  She is remained compliant with her home medications.  She is currently in an independent living situation but has the ability to move to assisted living, which the family has been encouraging her to take advantage of.  Daughter, at bedside, also notes that the patient recently was followed at home by concerned drivers as the patient was driving erratically.  Patient has since agreed to give up her driving privileges.  Patient's not having any pain.  Daughter notes that visual hallucinations have been an intermittent part of her mom's history.  [facility] past with concern for memory issues but does not have a formal diagnosis of dementia.   Past Medical History:  Diagnosis Date   Anxiety    Fibromyalgia    GERD (gastroesophageal reflux disease)    High blood pressure    HSV (herpes simplex virus) anogenital infection    IBS (irritable bowel syndrome)    Memory loss    Neuropathy    Obesity (BMI 30-39.9) 04/01/2014   Osteoarthritis    Overactive bladder    Restless leg syndrome    Sleep apnea     Review of Systems  Constitutional: No fever/chills Eyes: No visual changes. ENT: No sore throat. Cardiovascular: Denies chest pain. Respiratory: Denies shortness of breath. Gastrointestinal: No  abdominal pain.  No nausea, no vomiting. Positive diarrhea (resolved).  No constipation. Genitourinary: Negative for dysuria. Musculoskeletal: Negative for back pain. Skin: Negative for rash. Neurological: Negative for headaches, focal weakness or numbness. Positive visual hallucinations (acute on chronic).    ____________________________________________   PHYSICAL EXAM:  VITAL SIGNS: ED Triage Vitals  Enc Vitals Group     BP 03/25/21 1248 134/71     Pulse Rate 03/25/21 1248 70     Resp 03/25/21 1248 18     Temp 03/25/21 1248 98.2 F (36.8 C)     Temp src --      SpO2 03/25/21 1248 100 %     Weight 03/25/21 1257 89 lb 15.2 oz (40.8 kg)     Height 03/25/21 1257 5\' 5"  (1.651 m)   Constitutional: Alert and oriented. Well appearing and in no acute distress. Eyes: Conjunctivae are normal. PERRL. Head: Atraumatic. Nose: No congestion/rhinnorhea. Mouth/Throat: Mucous membranes are slightly dry.  Neck: No stridor. Cardiovascular: Normal rate, regular rhythm. Good peripheral circulation. Grossly normal heart sounds.   Respiratory: Normal respiratory effort.  No retractions. Lungs CTAB. Gastrointestinal: Soft and nontender. No distention.  Musculoskeletal: No lower extremity tenderness nor edema. No gross deformities of extremities. Neurologic:  Normal speech and language. No gross focal neurologic deficits are appreciated.  Skin:  Skin is warm, dry and intact. No rash noted.   ____________________________________________   LABS (all labs ordered are listed, but only abnormal results are displayed)  Labs Reviewed  CBC WITH  DIFFERENTIAL/PLATELET - Abnormal; Notable for the following components:      Result Value   RBC 3.73 (*)    Hemoglobin 11.5 (*)    All other components within normal limits  URINALYSIS, ROUTINE W REFLEX MICROSCOPIC - Abnormal; Notable for the following components:   Protein, ur TRACE (*)    All other components within normal limits  COMPREHENSIVE  METABOLIC PANEL - Abnormal; Notable for the following components:   Creatinine, Ser 1.18 (*)    GFR, Estimated 46 (*)    All other components within normal limits  URINE CULTURE  RESP PANEL BY RT-PCR (FLU A&B, COVID) ARPGX2  CBG MONITORING, ED   ____________________________________________  EKG   EKG Interpretation  Date/Time:  Thursday March 25 2021 13:23:35 EST Ventricular Rate:  67 PR Interval:  180 QRS Duration: 96 QT Interval:  388 QTC Calculation: 409 R Axis:   20 Text Interpretation: Normal sinus rhythm Incomplete right bundle branch block Cannot rule out Anterior infarct , age undetermined Abnormal ECG When compared with ECG of 17-Jun-2020 06:07, PREVIOUS ECG IS PRESENT Confirmed by Alona Bene [phone]) on 03/25/2021 1:25:38 PM        ____________________________________________  RADIOLOGY  CT Head Wo Contrast  Result Date: 03/25/2021 CLINICAL DATA:  Altered mental status EXAM: CT HEAD WITHOUT CONTRAST TECHNIQUE: Contiguous axial images were obtained from the base of the skull through the vertex without intravenous contrast. RADIATION DOSE REDUCTION: This exam was performed according to the departmental dose-optimization program which includes automated exposure control, adjustment of the mA and/or kV according to patient size and/or use of iterative reconstruction technique. COMPARISON:  MRI brain 12/25/2020 FINDINGS: Brain: Hypodensity in the pons and periventricular white matter compatible with chronic ischemic microvascular white matter disease. Otherwise, the brainstem, cerebellum, cerebral peduncles, thalamus, basal ganglia, basilar cisterns, and ventricular system appear within normal limits. No intracranial hemorrhage, mass lesion, or acute CVA. Vascular: There is atherosclerotic calcification of the cavernous carotid arteries bilaterally. Skull: Unremarkable Sinuses/Orbits: Mild chronic right ethmoid sinusitis. Other: No supplemental non-categorized findings.  IMPRESSION: 1. No acute intracranial findings. 2. Periventricular white matter and corona radiata hypodensities favor chronic ischemic microvascular white matter disease. 3. Atherosclerosis. 4. Mild chronic right ethmoid sinusitis. Electronically Signed   By: Gaylyn Rong M.D.   On: 03/25/2021 15:23    ____________________________________________   PROCEDURES  Procedure(s) performed:   Procedures  None  ____________________________________________   INITIAL IMPRESSION / ASSESSMENT AND PLAN / ED COURSE  Pertinent labs & imaging results that were available during my care of the patient were reviewed by me and considered in my medical decision making (see chart for details).   This patient is Presenting for Evaluation of AMS, which does require a range of treatment options, and is a complaint that involves a high risk of morbidity and mortality.  The Differential Diagnoses includes but is not exclusive to alcohol, illicit or prescription medications, intracranial pathology such as stroke, intracerebral hemorrhage, fever or infectious causes including sepsis, hypoxemia, uremia, trauma, endocrine related disorders such as diabetes, hypoglycemia, thyroid-related diseases, etc.   Critical Interventions- IVF and exam with labs and head imaging.    Medications  sodium chloride 0.9 % bolus 500 mL (500 mLs Intravenous New Bag/Given 03/25/21 1444)    Reassessment after intervention: Remains awake, alert, and conversational.    I did obtain Additional Historical Information from daughter at bedside who confirms symptoms and timeline along with living situation.   I decided to review pertinent External Data, and in summary patient  with [facility] as recently as 11/2020 with mild cognitive impairment noted at that time.   Clinical Laboratory Tests Ordered, included CBC without leukocytosis. No AKI. No anemia. No evidence of UTI.   Radiologic Tests Ordered, included CT head. I  independently interpreted the images and agree with radiology interpretation.   Cardiac Monitor Tracing which shows NSR   Social Determinants of Health Risk non-smoker.   Reevaluation with update and discussion with patient and daughter. Patient agrees to no longer drive and will give care keys to her daughter. Patient is agreeable to seek placement in assisted living offered at her facility, rather than her independent living facility.   Medical Decision Making: Summary:  Patient presents to the ED with acute on chronic altered mental status.  Some hallucinations (visual) noted but this has occurred in the past according to the daughter.  She is awake and alert, speaking with me freely.  Her vital signs within normal limits.  I do not see evidence of urinary tract infection or other underlying infectious process.  The CT imaging shows white matter changes which I would expect given age and medical history.  Doubt stroke.  She has no focal neurodeficits.  We discussed safe discharge planning.  Patient agrees to no longer drive.  She will also move forward with plans to go into assisted living rather than her independent living.  I have also advised that she return to see neurology, possibly for medication adjustment, versus consideration of dementia (frontotemporal) given visual hallucinations and findings.  Disposition: pending.   Care transferred to Dr. Dalene Seltzer pending COVID/Flu PCR.   ____________________________________________  FINAL CLINICAL IMPRESSION(S) / ED DIAGNOSES  Final diagnoses:  Dehydration  Episodes of formed visual hallucinations     Note:  This document was prepared using Dragon voice recognition software and may include unintentional dictation errors.  Alona Bene, MD, [facility]    Xavion Muscat, Arlyss Repress, MD 03/25/21 [phone]

## 2021-03-25 NOTE — ED Triage Notes (Signed)
Pt via pov from [facility] (independent living) with daughter in Social worker. She reports that staff there could not wake her up last night at 1700 with loud yelling, finally "stirred a bit." Pt reports that she was up late and doesn't remember any of that. DIL reports that nurse at [facility] believes pt is dehydrated and possibly has UTI. Pt has had diarrhea for 6 days, and that pt seems confused, thinking children are running around her apartment. Pt reports that she was seeing 2 children out of her right eye. Pt alert & oriented x 4 at triage. NAD noted.

## 2021-03-25 NOTE — NC FL2 (Signed)
[place] MEDICAID FL2 LEVEL OF CARE SCREENING TOOL     IDENTIFICATION  Patient Name: [place] COHICK Birthdate: [birth year] Sex: female Admission Date (Current Location): 03/25/2021  [facility] and [place] Number:  Producer, television/film/video and Address:  [facility], [address] [place], [place] [address]      Provider Number: [id number]  Attending Physician Name and Address:  Alvira Monday, MD  Relative Name and Phone Number:  MCKENIZE, MEZERA   [phone]    Current Level of Care: Hospital Recommended Level of Care: Assisted Living Facility Prior Approval Number:    Date Approved/Denied:   PASRR Number:    Discharge Plan:  (Assisted Living)    Current Diagnoses: Patient Active Problem List   Diagnosis Date Noted   Nausea & vomiting 12/16/2017   Diarrhea 12/16/2017   Hypokalemia 12/16/2017   Gait abnormality 08/30/2017   Lumbar radiculopathy 08/30/2017   Chronic left-sided low back pain with left-sided sciatica 07/21/2017   Personal history of calcium pyrophosphate deposition disease (CPPD) 07/29/2016   History of peripheral neuropathy 07/29/2016   History of total knee replacement, right 07/22/2016   Pseudogout 01/22/2016   Primary osteoarthritis of both knees 01/22/2016   Primary osteoarthritis of both hands 01/22/2016   Primary osteoarthritis of both feet 01/22/2016   Fibromyalgia 01/22/2016   Primary insomnia 01/22/2016   Obesity (BMI 30-39.9) 04/01/2014   Obstructive sleep apnea 04/19/2013   Essential hypertension, benign 04/19/2013   Memory loss 08/02/2012    Orientation RESPIRATION BLADDER Height & Weight     Self, Time, Situation, Place  Normal Continent Weight: 89 lb 15.2 oz (40.8 kg) Height:  5\' 5"  (165.1 cm)  BEHAVIORAL SYMPTOMS/MOOD NEUROLOGICAL BOWEL NUTRITION STATUS      Continent Diet (Regular)  AMBULATORY STATUS COMMUNICATION OF NEEDS Skin   Independent Verbally Normal                       Personal Care  Assistance Level of Assistance  Bathing, Feeding, Dressing Bathing Assistance: Limited assistance Feeding assistance: Independent Dressing Assistance: Limited assistance     Functional Limitations Info  Sight, Hearing, Speech Sight Info: Adequate Hearing Info: Adequate Speech Info: Adequate    SPECIAL CARE FACTORS FREQUENCY                       Contractures Contractures Info: Not present    Additional Factors Info  Code Status, Allergies Code Status Info: Full Allergies Info: Erythromycin,Penicillins,Sulfa Antibiotics,Tetracyclines & Related           Current Medications (03/25/2021):  This is the current hospital active medication list No current facility-administered medications for this encounter.   Current Outpatient Medications  Medication Sig Dispense Refill   acetaminophen (TYLENOL) 325 MG tablet Take 325 mg by mouth See admin instructions. 325 mg twice a day and can take every 6 hours as needed     acyclovir (ZOVIRAX) 400 MG tablet Take 400 mg by mouth 2 (two) times daily.      diltiazem (CARDIZEM CD) 120 MG 24 hr capsule Take 120 mg by mouth daily.     donepezil (ARICEPT) 5 MG tablet TAKE 1 TABLET BY MOUTH AT  BEDTIME 90 tablet 3   gabapentin (NEURONTIN) 300 MG capsule Take 1 capsule by mouth at bedtime only. (Patient taking differently: Take 300 mg by mouth at bedtime.) 30 capsule 0   LORazepam (ATIVAN) 1 MG tablet Take 1 mg by mouth 2 (two)  times daily as needed for anxiety.     Omega-3 Fatty Acids (FISH OIL) 1200 MG CAPS Take 1,200 mg by mouth daily.      polyethylene glycol (MIRALAX) 17 g packet Take 17 g by mouth daily. Dissolve one cap full in solution (water, gatorade, etc.) and administer once cap-full daily. You may titrate up daily by 1 cap-full until the patient is having pudding consistency of stools. After the patient is able to start passing softer stools they will need to be on 1/2 cap-full daily for 2 weeks. (Patient not taking: Reported on  03/25/2021) 14 each 0     Discharge Medications: Please see discharge summary for a list of discharge medications.  Relevant Imaging Results:  Relevant Lab Results:   Additional Information SSN# [id number] has had Covid vaccines  Cedrik Heindl, LCSW

## 2021-03-26 LAB — URINE CULTURE: Culture: NO GROWTH

## 2021-03-30 ENCOUNTER — Telehealth: Payer: Self-pay | Admitting: Neurology

## 2021-03-30 NOTE — Telephone Encounter (Signed)
Pt's son called stating he missed a call from [place] regarding a sooner appointment for pt. I could not find a sooner appt or any notes regarding a call. Please advise if she needs to come sooner at [phone].

## 2021-03-30 NOTE — Telephone Encounter (Signed)
I did not see any missed calls from our office. I called the patient's son back. He confirmed her pending appt on 06/09/21. He did not have any other needs.

## 2021-04-02 ENCOUNTER — Emergency Department (HOSPITAL_COMMUNITY)
Admission: EM | Admit: 2021-04-02 | Discharge: 2021-04-02 | Disposition: A | Discharge status: Home / Self Care | Payer: Medicare Other | Attending: Emergency Medicine | Admitting: Emergency Medicine

## 2021-04-02 ENCOUNTER — Emergency Department (HOSPITAL_COMMUNITY): Payer: Medicare Other

## 2021-04-02 DIAGNOSIS — R2689 Other abnormalities of gait and mobility: Secondary | ICD-10-CM | POA: Diagnosis not present

## 2021-04-02 DIAGNOSIS — R9431 Abnormal electrocardiogram [ECG] [EKG]: Secondary | ICD-10-CM | POA: Diagnosis not present

## 2021-04-02 DIAGNOSIS — R519 Headache, unspecified: Secondary | ICD-10-CM | POA: Diagnosis not present

## 2021-04-02 DIAGNOSIS — Y9301 Activity, walking, marching and hiking: Secondary | ICD-10-CM | POA: Diagnosis not present

## 2021-04-02 DIAGNOSIS — S0993XA Unspecified injury of face, initial encounter: Secondary | ICD-10-CM | POA: Insufficient documentation

## 2021-04-02 DIAGNOSIS — S0081XA Abrasion of other part of head, initial encounter: Secondary | ICD-10-CM | POA: Diagnosis not present

## 2021-04-02 DIAGNOSIS — W01198A Fall on same level from slipping, tripping and stumbling with subsequent striking against other object, initial encounter: Secondary | ICD-10-CM | POA: Insufficient documentation

## 2021-04-02 DIAGNOSIS — Y92129 Unspecified place in nursing home as the place of occurrence of the external cause: Secondary | ICD-10-CM | POA: Insufficient documentation

## 2021-04-02 DIAGNOSIS — W19XXXA Unspecified fall, initial encounter: Secondary | ICD-10-CM

## 2021-04-02 DIAGNOSIS — M542 Cervicalgia: Secondary | ICD-10-CM | POA: Diagnosis not present

## 2021-04-02 DIAGNOSIS — Z043 Encounter for examination and observation following other accident: Secondary | ICD-10-CM | POA: Diagnosis not present

## 2021-04-02 DIAGNOSIS — Z743 Need for continuous supervision: Secondary | ICD-10-CM | POA: Diagnosis not present

## 2021-04-02 DIAGNOSIS — S0990XA Unspecified injury of head, initial encounter: Secondary | ICD-10-CM | POA: Diagnosis not present

## 2021-04-02 NOTE — Discharge Instructions (Signed)
As discussed, your evaluation today has been largely reassuring.  But, it is important that you monitor your condition carefully, and do not hesitate to return to the ED if you develop new, or concerning changes in your condition. ? ?Otherwise, please follow-up with your physician for appropriate ongoing care. ? ?

## 2021-04-02 NOTE — ED Provider Notes (Signed)
[facility] EMERGENCY DEPARTMENT Provider Note   CSN: [id number] Arrival date & time: 04/02/21  1043     History  Chief Complaint  Patient presents with   Lynn Price is a 81 y.o. female.  HPI Elderly female arrives from nursing facility via EMS after a fall with facial trauma.  Patient has frailty, is in the process of transitioning to assisted living, but today was in her usual state of health, otherwise, when she attempted to walk without her walker.  She recalls the entirety of the event, falling, striking her face.  She has been ambulatory again since the event, has no pelvis pain, lower extremity pain or weakness.  However, the patient had pain in her head, face, neck after the event.  No confusion, disorientation, vision changes.  EMS reports no hemodynamic instability in route, patient is interacting in a typical manner, per report.    Home Medications Prior to Admission medications   Medication Sig Start Date End Date Taking? Authorizing Provider  acetaminophen (TYLENOL) 325 MG tablet Take 325 mg by mouth See admin instructions. 325 mg twice a day and can take every 6 hours as needed    [provider]  acyclovir (ZOVIRAX) 400 MG tablet Take 400 mg by mouth 2 (two) times daily.     [provider]  diltiazem (CARDIZEM CD) 120 MG 24 hr capsule Take 120 mg by mouth daily.    [provider]  donepezil (ARICEPT) 5 MG tablet TAKE 1 TABLET BY MOUTH AT  BEDTIME 09/24/20   Suzzanne Cloud, NP  gabapentin (NEURONTIN) 300 MG capsule Take 1 capsule by mouth at bedtime only. Patient taking differently: Take 300 mg by mouth at bedtime. 03/07/19   Bo Merino, MD  LORazepam (ATIVAN) 1 MG tablet Take 1 mg by mouth 2 (two) times daily as needed for anxiety.    [provider]  Omega-3 Fatty Acids (FISH OIL) 1200 MG CAPS Take 1,200 mg by mouth daily.     [provider]  polyethylene glycol (MIRALAX) 17 g packet Take  17 g by mouth daily. Dissolve one cap full in solution (water, gatorade, etc.) and administer once cap-full daily. You may titrate up daily by 1 cap-full until the patient is having pudding consistency of stools. After the patient is able to start passing softer stools they will need to be on 1/2 cap-full daily for 2 weeks. Patient not taking: Reported on 03/25/2021 06/08/19   Couture, Cortni S, PA-C      Allergies    Erythromycin, Penicillins, Sulfa antibiotics, and Tetracyclines & related    Review of Systems   Review of Systems  Constitutional:        Per HPI, otherwise negative  HENT:         Per HPI, otherwise negative  Respiratory:         Per HPI, otherwise negative  Cardiovascular:        Per HPI, otherwise negative  Gastrointestinal:  Negative for vomiting.  Endocrine:       Negative aside from HPI  Genitourinary:        Neg aside from HPI   Musculoskeletal:        Per HPI, otherwise negative  Skin:  Positive for wound.  Neurological:  Negative for syncope.   Physical Exam Updated Vital Signs There were no vitals taken for this visit. Physical Exam Vitals and nursing note reviewed.  Constitutional:  General: She is not in acute distress.    Appearance: She is well-developed. She is not toxic-appearing.  HENT:     Head: Normocephalic.   Eyes:     Conjunctiva/sclera: Conjunctivae normal.  Neck:   Cardiovascular:     Rate and Rhythm: Normal rate and regular rhythm.  Pulmonary:     Effort: Pulmonary effort is normal. No respiratory distress.     Breath sounds: Normal breath sounds. No stridor.  Abdominal:     General: There is no distension.  Musculoskeletal:     Comments: Pelvis stable, patient flexes each hip independently, spontaneously and to command, with no pain or discomfort legs are symmetric at length.  Skin:    General: Skin is warm and dry.  Neurological:     Mental Status: She is alert and oriented to person, place, and time.     Cranial  Nerves: No cranial nerve deficit.     Motor: Atrophy present. No tremor or abnormal muscle tone.     Coordination: Coordination normal.    ED Results / Procedures / Treatments   Labs (all labs ordered are listed, but only abnormal results are displayed) Labs Reviewed - No data to display  EKG EKG Interpretation  Date/Time:  Friday April 02 2021 10:49:50 EST Ventricular Rate:  68 PR Interval:  165 QRS Duration: 98 QT Interval:  393 QTC Calculation: 418 R Axis:   -33 Text Interpretation: Sinus rhythm Left axis deviation Low voltage, precordial leads RSR' in V1 or V2, right VCD or RVH No significant change since last tracing Abnormal ECG Confirmed by Carmin Muskrat [phone]) on 04/02/2021 10:57:59 AM  Radiology CT Head Wo Contrast  Result Date: 04/02/2021 CLINICAL DATA:  Fall, facial trauma EXAM: CT HEAD WITHOUT CONTRAST CT MAXILLOFACIAL WITHOUT CONTRAST CT CERVICAL SPINE WITHOUT CONTRAST TECHNIQUE: Multidetector CT imaging of the head, cervical spine, and maxillofacial structures were performed using the standard protocol without intravenous contrast. Multiplanar CT image reconstructions of the cervical spine and maxillofacial structures were also generated. RADIATION DOSE REDUCTION: This exam was performed according to the departmental dose-optimization program which includes automated exposure control, adjustment of the mA and/or kV according to patient size and/or use of iterative reconstruction technique. COMPARISON:  03/25/2021 FINDINGS: CT HEAD FINDINGS Brain: No evidence of acute infarction, hemorrhage, hydrocephalus, extra-axial collection or mass lesion/mass effect. Periventricular and deep white matter hypodensity. Vascular: No hyperdense vessel or unexpected calcification. CT FACIAL BONES FINDINGS Skull: Normal. Negative for acute fracture or focal lesion. Facial bones: No displaced fractures or dislocations. Sinuses/Orbits: No acute finding. Other: None. CT CERVICAL SPINE FINDINGS  Alignment: Positional straightening of the normal cervical lordosis. Minimal multilevel degenerative listhesis. Skull base and vertebrae: No acute fracture. No primary bone lesion or focal pathologic process. Soft tissues and spinal canal: No prevertebral fluid or swelling. No visible canal hematoma. Disc levels: Moderate disc space height loss and osteophytosis of the lower cervical levels, worst from C4 through C7. Upper chest: Negative. Other: None. IMPRESSION: 1. No acute intracranial pathology. Small-vessel white matter disease. 2. No displaced fractures or dislocations of the facial bones. 3. No fracture or static subluxation of the cervical spine. Electronically Signed   By: Delanna Ahmadi M.D.   On: 04/02/2021 11:27   CT Cervical Spine Wo Contrast  Result Date: 04/02/2021 CLINICAL DATA:  Fall, facial trauma EXAM: CT HEAD WITHOUT CONTRAST CT MAXILLOFACIAL WITHOUT CONTRAST CT CERVICAL SPINE WITHOUT CONTRAST TECHNIQUE: Multidetector CT imaging of the head, cervical spine, and maxillofacial structures were performed using the  standard protocol without intravenous contrast. Multiplanar CT image reconstructions of the cervical spine and maxillofacial structures were also generated. RADIATION DOSE REDUCTION: This exam was performed according to the departmental dose-optimization program which includes automated exposure control, adjustment of the mA and/or kV according to patient size and/or use of iterative reconstruction technique. COMPARISON:  03/25/2021 FINDINGS: CT HEAD FINDINGS Brain: No evidence of acute infarction, hemorrhage, hydrocephalus, extra-axial collection or mass lesion/mass effect. Periventricular and deep white matter hypodensity. Vascular: No hyperdense vessel or unexpected calcification. CT FACIAL BONES FINDINGS Skull: Normal. Negative for acute fracture or focal lesion. Facial bones: No displaced fractures or dislocations. Sinuses/Orbits: No acute finding. Other: None. CT CERVICAL SPINE  FINDINGS Alignment: Positional straightening of the normal cervical lordosis. Minimal multilevel degenerative listhesis. Skull base and vertebrae: No acute fracture. No primary bone lesion or focal pathologic process. Soft tissues and spinal canal: No prevertebral fluid or swelling. No visible canal hematoma. Disc levels: Moderate disc space height loss and osteophytosis of the lower cervical levels, worst from C4 through C7. Upper chest: Negative. Other: None. IMPRESSION: 1. No acute intracranial pathology. Small-vessel white matter disease. 2. No displaced fractures or dislocations of the facial bones. 3. No fracture or static subluxation of the cervical spine. Electronically Signed   By: Delanna Ahmadi M.D.   On: 04/02/2021 11:27   CT Maxillofacial WO CM  Result Date: 04/02/2021 CLINICAL DATA:  Fall, facial trauma EXAM: CT HEAD WITHOUT CONTRAST CT MAXILLOFACIAL WITHOUT CONTRAST CT CERVICAL SPINE WITHOUT CONTRAST TECHNIQUE: Multidetector CT imaging of the head, cervical spine, and maxillofacial structures were performed using the standard protocol without intravenous contrast. Multiplanar CT image reconstructions of the cervical spine and maxillofacial structures were also generated. RADIATION DOSE REDUCTION: This exam was performed according to the departmental dose-optimization program which includes automated exposure control, adjustment of the mA and/or kV according to patient size and/or use of iterative reconstruction technique. COMPARISON:  03/25/2021 FINDINGS: CT HEAD FINDINGS Brain: No evidence of acute infarction, hemorrhage, hydrocephalus, extra-axial collection or mass lesion/mass effect. Periventricular and deep white matter hypodensity. Vascular: No hyperdense vessel or unexpected calcification. CT FACIAL BONES FINDINGS Skull: Normal. Negative for acute fracture or focal lesion. Facial bones: No displaced fractures or dislocations. Sinuses/Orbits: No acute finding. Other: None. CT CERVICAL SPINE  FINDINGS Alignment: Positional straightening of the normal cervical lordosis. Minimal multilevel degenerative listhesis. Skull base and vertebrae: No acute fracture. No primary bone lesion or focal pathologic process. Soft tissues and spinal canal: No prevertebral fluid or swelling. No visible canal hematoma. Disc levels: Moderate disc space height loss and osteophytosis of the lower cervical levels, worst from C4 through C7. Upper chest: Negative. Other: None. IMPRESSION: 1. No acute intracranial pathology. Small-vessel white matter disease. 2. No displaced fractures or dislocations of the facial bones. 3. No fracture or static subluxation of the cervical spine. Electronically Signed   By: Delanna Ahmadi M.D.   On: 04/02/2021 11:27    Procedures Procedures    Medications Ordered in ED Medications - No data to display  ED Course/ Medical Decision Making/ A&P  This patient presents to the ED for concern of fall, facial trauma, this involves an extensive number of treatment options, and is a complaint that carries with it a high risk of complications and morbidity.  The differential diagnosis includes illness prior to the fall, including dehydration or cardiogenic cause, though these are less likely given the patient's recall of the entirety, including not using her walker.  Post trauma conditions including intracranial injury,  fracture, cervical spine injury all considered   Co morbidities that complicate the patient evaluation  Frailty, age   Social Determinants of Health:  Age, limited mobility   Additional history obtained:  Additional history and/or information obtained from EMS External records from outside source obtained and reviewed including as above   After the initial evaluation, orders, including: CT were initiated.  Patient placed on Cardiac and Pulse-Oximetry Monitors. The patient was maintained on a cardiac monitor.  The cardiac monitored showed an rhythm of 70 sinus  normal The patient was also maintained on pulse oximetry. The readings were typically 99%, room air normal                          On repeat evaluation of the patient stayed the same wound has been cleaned, there is no area of the nose requiring Dermabond or other laceration repair, but with avulsion of skin, abrasion, patient will have topical antibiotic placed.    Imaging Studies ordered:  I independently visualized and interpreted imaging which showed no intracranial hemorrhage, no cervical spine, skull fracture, or facial bone fracture I agree with the radiologist interpretation   Dispostion / Final MDM:  After consideration of the diagnostic results and the patient's response to treatment, she remains awake, alert, neurologically unchanged, hemodynamically stable, without complaints.  I feel that the patent would benefit from discharge, with outpatient follow-up as needed for her facial abrasion/avulsion, fall.  Given the weakness hospitalization is a consideration, but as above, with unremarkable vital signs, preserved neurovascular status, reassuring CT scans, and EKG without arrhythmia, low suspicion for cardiogenic etiology for her fall, patient discharged in stable condition.   Final Clinical Impression(s) / ED Diagnoses Final diagnoses:  Fall, initial encounter    Rx / DC Orders ED Discharge Orders     None         Carmin Muskrat, MD 04/02/21 1149

## 2021-04-02 NOTE — ED Triage Notes (Addendum)
[facility] from [facility] was walking without her walker in the bathroom when she slipped and fell, hitting her nose on a cabinet. Patient stood from ground with minimal assistance. Patient reported head, neck, and back pain with EMS while on ground but states those pain sites improved after getting off of floor. Laceration to nose. No LOC, no blood thinners. Patient has dementia and short term memory loss, for questions call Caregiver Debra Strandberg [phone]  BP 130/88 HR 72 SpO2 100% on room air RR 18

## 2021-04-02 NOTE — ED Notes (Addendum)
Report given to Denies Pugh at  [facility] and also updated Christie-daughter who is on way into town from [place]. PTAR has been arranged.

## 2021-04-03 ENCOUNTER — Emergency Department (HOSPITAL_COMMUNITY)
Admission: EM | Admit: 2021-04-03 | Discharge: 2021-04-04 | Disposition: A | Discharge status: Home / Self Care | Payer: Medicare Other | Attending: Emergency Medicine | Admitting: Emergency Medicine

## 2021-04-03 ENCOUNTER — Encounter (HOSPITAL_COMMUNITY): Payer: Self-pay

## 2021-04-03 DIAGNOSIS — E860 Dehydration: Secondary | ICD-10-CM | POA: Insufficient documentation

## 2021-04-03 DIAGNOSIS — W19XXXA Unspecified fall, initial encounter: Secondary | ICD-10-CM | POA: Insufficient documentation

## 2021-04-03 DIAGNOSIS — R112 Nausea with vomiting, unspecified: Secondary | ICD-10-CM | POA: Diagnosis not present

## 2021-04-03 DIAGNOSIS — Z79899 Other long term (current) drug therapy: Secondary | ICD-10-CM | POA: Insufficient documentation

## 2021-04-03 DIAGNOSIS — R6889 Other general symptoms and signs: Secondary | ICD-10-CM | POA: Diagnosis not present

## 2021-04-03 DIAGNOSIS — Z743 Need for continuous supervision: Secondary | ICD-10-CM | POA: Diagnosis not present

## 2021-04-03 DIAGNOSIS — R42 Dizziness and giddiness: Secondary | ICD-10-CM | POA: Diagnosis not present

## 2021-04-03 DIAGNOSIS — S0031XA Abrasion of nose, initial encounter: Secondary | ICD-10-CM | POA: Insufficient documentation

## 2021-04-03 DIAGNOSIS — I10 Essential (primary) hypertension: Secondary | ICD-10-CM | POA: Insufficient documentation

## 2021-04-03 DIAGNOSIS — K5900 Constipation, unspecified: Secondary | ICD-10-CM | POA: Insufficient documentation

## 2021-04-03 DIAGNOSIS — R110 Nausea: Secondary | ICD-10-CM

## 2021-04-03 DIAGNOSIS — R1111 Vomiting without nausea: Secondary | ICD-10-CM | POA: Diagnosis not present

## 2021-04-03 DIAGNOSIS — S0992XA Unspecified injury of nose, initial encounter: Secondary | ICD-10-CM | POA: Diagnosis present

## 2021-04-03 MED ORDER — LACTATED RINGERS IV BOLUS
1000.0000 mL | Freq: Once | INTRAVENOUS | Status: AC
Start: 2021-04-04 — End: 2021-04-04
  Administered 2021-04-04: 1000 mL via INTRAVENOUS

## 2021-04-03 MED ORDER — ONDANSETRON HCL 4 MG/2ML IJ SOLN
4.0000 mg | Freq: Once | INTRAMUSCULAR | Status: AC
  Administered 2021-04-04: 4 mg via INTRAVENOUS
  Filled 2021-04-03: qty 2

## 2021-04-03 NOTE — ED Triage Notes (Signed)
Pt BIB [facility] from [place]. Pt was seen here yesterday for fall, states has been nauseous w/ vomiting since that time yesterday. Pt also has come off ativan 2 days ago and has been on it for 17 years prior to that time. Pt reports she feels similar "every time they take her off of it". Facial trauma from fall yesterday noted.

## 2021-04-03 NOTE — ED Provider Notes (Signed)
Emergency Department Provider Note  I have reviewed the triage vital signs and the nursing notes.  HISTORY  Chief Complaint Vomiting   HPI Lynn Price is a 81 y.o. female with emesis and dizziness.  Patient states that she was seen here couple days ago she had a fall and had some facial injuries.  Says she was doing fine but then today she felt nauseous and had episode of emesis.  She feels like she did not get the appropriate dose of Ativan or she got lorazepam instead of Ativan and states that she had episodes like this in the past after coming off the Ativan.  Her daughter-in-law is with her and states that she is pretty sure she got her Ativan this evening but cannot be positive.  No other medication changes.  She still feels a bit lightheaded but that has come and gone for quite a while now.  PMH Past Medical History:  Diagnosis Date   Anxiety    Fibromyalgia    GERD (gastroesophageal reflux disease)    High blood pressure    HSV (herpes simplex virus) anogenital infection    IBS (irritable bowel syndrome)    Memory loss    Neuropathy    Obesity (BMI 30-39.9) 04/01/2014   Osteoarthritis    Overactive bladder    Restless leg syndrome    Sleep apnea     Home Medications Prior to Admission medications   Medication Sig Start Date End Date Taking? Authorizing Provider  acetaminophen (TYLENOL) 325 MG tablet Take 325 mg by mouth See admin instructions. 325 mg twice a day and can take every 6 hours as needed for pain/headache   Yes [provider]  acyclovir (ZOVIRAX) 400 MG tablet Take 400 mg by mouth 2 (two) times daily.    Yes [provider]  diltiazem (CARDIZEM CD) 120 MG 24 hr capsule Take 120 mg by mouth daily.   Yes [provider]  docusate sodium (COLACE) 100 MG capsule Take 1 capsule (100 mg total) by mouth every 12 (twelve) hours. 04/04/21  Yes Priyana Mccarey, Barbara Cower, MD  donepezil (ARICEPT) 5 MG tablet TAKE 1 TABLET BY MOUTH AT  BEDTIME Patient  taking differently: Take 5 mg by mouth at bedtime. 09/24/20  Yes Glean Salvo, NP  gabapentin (NEURONTIN) 300 MG capsule Take 1 capsule by mouth at bedtime only. Patient taking differently: Take 300 mg by mouth at bedtime. 03/07/19  Yes Deveshwar, Janalyn Rouse, MD  LORazepam (ATIVAN) 1 MG tablet Take 1 mg by mouth 2 (two) times daily as needed for anxiety.   Yes [provider]  Omega-3 Fatty Acids (FISH OIL) 1200 MG CAPS Take 1,200 mg by mouth daily.    Yes [provider]  ondansetron (ZOFRAN-ODT) 4 MG disintegrating tablet 4mg  ODT q4 hours prn nausea/vomit 04/04/21  Yes Mattea Seger, 06/02/21, MD  polyethylene glycol (MIRALAX / GLYCOLAX) 17 g packet Take 17 g by mouth daily. 04/04/21  Yes Jelan Batterton, 06/02/21, MD  sodium phosphate (FLEET) 7-19 GM/118ML ENEM Place 133 mLs (1 enema total) rectally daily as needed for up to 5 doses for severe constipation. 04/04/21  Yes Javontae Marlette, 06/02/21, MD    Social History Social History   Tobacco Use   Smoking status: Never   Smokeless tobacco: Never  Vaping Use   Vaping Use: Never used  Substance Use Topics   Alcohol use: Not Currently    Comment: wine occas   Drug use: No    Review of Systems: Documented in HPI  ____________________________________________  PHYSICAL EXAM: VITAL SIGNS: ED Triage Vitals  Enc Vitals Group     BP 04/03/21 2257 (!) 179/89     Pulse Rate 04/03/21 2257 70     Resp 04/03/21 2257 18     Temp 04/03/21 2257 98.2 F (36.8 C)     Temp Source 04/03/21 2257 Oral     SpO2 04/03/21 2257 100 %     Weight 04/03/21 2250 90 lb 6.2 oz (41 kg)     Height 04/03/21 2250 5\' 5"  (1.651 m)   Physical Exam Vitals and nursing note reviewed.  Constitutional:      Appearance: She is well-developed.     Comments: Appears mildly cachectic  HENT:     Head: Normocephalic.     Comments: Abrasions to her anterior nose Erythema and healing bruises around both eyes    Mouth/Throat:     Mouth: Mucous membranes are moist.     Pharynx: Oropharynx is  clear.  Eyes:     Pupils: Pupils are equal, round, and reactive to light.  Cardiovascular:     Rate and Rhythm: Normal rate and regular rhythm.  Pulmonary:     Effort: No respiratory distress.     Breath sounds: No stridor.  Abdominal:     General: Abdomen is flat. There is no distension.  Musculoskeletal:        General: No swelling, tenderness or deformity. Normal range of motion.     Cervical back: Normal range of motion.  Skin:    General: Skin is warm and dry.  Neurological:     General: No focal deficit present.     Mental Status: She is alert.      ____________________________________________   LABS (all labs ordered are listed, but only abnormal results are displayed)  Labs Reviewed  CBC WITH DIFFERENTIAL/PLATELET - Abnormal; Notable for the following components:      Result Value   RBC 3.74 (*)    Hemoglobin 11.8 (*)    HCT 35.0 (*)    All other components within normal limits  COMPREHENSIVE METABOLIC PANEL - Abnormal; Notable for the following components:   Glucose, Bld 112 (*)    Creatinine, Ser 1.08 (*)    GFR, Estimated 51 (*)    All other components within normal limits  URINALYSIS, ROUTINE W REFLEX MICROSCOPIC - Abnormal; Notable for the following components:   Ketones, ur 15 (*)    All other components within normal limits  MAGNESIUM   ____________________________________________  EKG   EKG Interpretation  Date/Time:  Sunday April 04 2021 00:41:33 EST Ventricular Rate:  70 PR Interval:  184 QRS Duration: 84 QT Interval:  400 QTC Calculation: 432 R Axis:   -22 Text Interpretation: Normal sinus rhythm Low voltage QRS Cannot rule out Anterior infarct , age undetermined Abnormal ECG When compared with ECG of 02-Apr-2021 10:49, PREVIOUS ECG IS PRESENT Confirmed by Marily MemosMesner, Alekhya Gravlin [phone]([id number]) on 04/04/2021 1:09:50 AM        ____________________________________________  RADIOLOGY  CT Head Wo Contrast  Result Date: 04/04/2021 CLINICAL DATA:   Dizziness, persistent/recurrent. EXAM: CT HEAD WITHOUT CONTRAST TECHNIQUE: Contiguous axial images were obtained from the base of the skull through the vertex without intravenous contrast. RADIATION DOSE REDUCTION: This exam was performed according to the departmental dose-optimization program which includes automated exposure control, adjustment of the mA and/or kV according to patient size and/or use of iterative reconstruction technique. COMPARISON:  04/02/2021. FINDINGS: Brain: No acute intracranial hemorrhage, midline shift or mass effect.  No extra-axial fluid collection. Periventricular white matter hypodensities are noted bilaterally. Mild atrophy is noted. There is no hydrocephalus. Vascular: Atherosclerotic calcification of the carotid siphons. No hyperdense vessel. Skull: Normal. Negative for fracture or focal lesion. Sinuses/Orbits: No acute finding. Other: None. IMPRESSION: Stable head CT with no acute intracranial process. Electronically Signed   By: Thornell Sartorius M.D.   On: 04/04/2021 04:42   ____________________________________________  PROCEDURES  Procedure(s) performed:   Procedures ____________________________________________  INITIAL IMPRESSION / ASSESSMENT AND PLAN   This patient presents to the ED for concern of dizziness and emesis, this involves an extensive number of treatment options, and is a complaint that carries with it a high risk of complications and morbidity.  The differential diagnosis includes Ativan withdrawal, generalized emesis, intracranial bleed. Initial plan to treat her symptoms and check her labs give her some fluids as she does appear slightly dehydrated.  She had decreased intake secondary to depression and decreased meals after multiple deaths in the last couple years.  Additional history obtained:  Additional history obtained from grandson and daughter-in-law Previous records obtained and reviewed in epic  Co morbidities that complicate the patient  evaluation  Hypertension, dementia, neuropathy, IBS  Social Determinants of Health:  Was living in independent living but recently moved to assisted living after a fall and to hopefully help with her nutrition  ED Course  Images ordered viewed and obtained by myself. Agree with Radiology interpretation. Details in ED course.  Labs ordered reviewed by myself as detailed in ED course.  Consultations obtained/considered detailed in ED course.   Clinical Course as of 04/04/21 3546  Wynelle Link Apr 04, 2021  0109 Creatinine(!): 1.08 Slightly above baseline of 0.8-0.9 range [JM]  0109 Hemoglobin(!): 11.8 [JM]  0109 Magnesium: 1.8 [JM]    Clinical Course User Index [JM] Kyarra Vancamp, Barbara Cower, MD      Cardiac Monitoring:  The patient was maintained on a cardiac monitor.  I personally viewed and interpreted the cardiac monitored which showed an underlying rhythm of: n/a  CRITICAL INTERVENTIONS:  Fluids and antiemetics  Reevaluation:  After the interventions noted above, I reevaluated the patient and found that they have :improved.  Patient initially had some improvement in her symptoms but then got nauseous again when she went to go to the bathroom.  Sounds like she was trying have a bowel movement.  Went ahead and gave her half of a dose of Ativan.  Went ahead and got a head CT with the hypertension, recent trauma and the persistent emesis and this was negative for  FINAL IMPRESSION AND PLAN Final diagnoses:  Nausea  Dehydration  Constipation, unspecified constipation type    A medical screening exam was performed and I feel the patient has had an appropriate workup for their chief complaint at this time and likelihood of emergent condition existing is low. They have been counseled on decision, DISCHARGE, follow up and which symptoms necessitate immediate return to the emergency department. They or their family verbally stated understanding and agreement with plan and discharged in stable  condition.   ____________________________________________   NEW OUTPATIENT MEDICATIONS STARTED DURING THIS VISIT:  Discharge Medication List as of 04/04/2021  4:54 AM     START taking these medications   Details  docusate sodium (COLACE) 100 MG capsule Take 1 capsule (100 mg total) by mouth every 12 (twelve) hours., Starting Sun 04/04/2021, Print    ondansetron (ZOFRAN-ODT) 4 MG disintegrating tablet 4mg  ODT q4 hours prn nausea/vomit, Print    sodium phosphate (  FLEET) 7-19 GM/118ML ENEM Place 133 mLs (1 enema total) rectally daily as needed for up to 5 doses for severe constipation., Starting Sun 04/04/2021, Print        Note:  This note was prepared with assistance of Dragon voice recognition software. Occasional wrong-word or sound-a-like substitutions may have occurred due to the inherent limitations of voice recognition software.    Sahiti Joswick, Barbara CowerJason, MD 04/04/21 [phone]

## 2021-04-04 ENCOUNTER — Emergency Department (HOSPITAL_COMMUNITY): Payer: Medicare Other

## 2021-04-04 DIAGNOSIS — R42 Dizziness and giddiness: Secondary | ICD-10-CM | POA: Diagnosis not present

## 2021-04-04 LAB — CBC WITH DIFFERENTIAL/PLATELET
Abs Immature Granulocytes: 0.04 10*3/uL (ref 0.00–0.07)
Basophils Absolute: 0.1 10*3/uL (ref 0.0–0.1)
Basophils Relative: 1 %
Eosinophils Absolute: 0.1 10*3/uL (ref 0.0–0.5)
Eosinophils Relative: 1 %
HCT: 35 % — ABNORMAL LOW (ref 36.0–46.0)
Hemoglobin: 11.8 g/dL — ABNORMAL LOW (ref 12.0–15.0)
Immature Granulocytes: 1 %
Lymphocytes Relative: 16 %
Lymphs Abs: 1.1 10*3/uL (ref 0.7–4.0)
MCH: 31.6 pg (ref 26.0–34.0)
MCHC: 33.7 g/dL (ref 30.0–36.0)
MCV: 93.6 fL (ref 80.0–100.0)
Monocytes Absolute: 0.7 10*3/uL (ref 0.1–1.0)
Monocytes Relative: 10 %
Neutro Abs: 5 10*3/uL (ref 1.7–7.7)
Neutrophils Relative %: 71 %
Platelets: 203 10*3/uL (ref 150–400)
RBC: 3.74 MIL/uL — ABNORMAL LOW (ref 3.87–5.11)
RDW: 12.9 % (ref 11.5–15.5)
WBC: 6.9 10*3/uL (ref 4.0–10.5)
nRBC: 0 % (ref 0.0–0.2)

## 2021-04-04 LAB — COMPREHENSIVE METABOLIC PANEL
ALT: 16 U/L (ref 0–44)
AST: 23 U/L (ref 15–41)
Albumin: 4 g/dL (ref 3.5–5.0)
Alkaline Phosphatase: 66 U/L (ref 38–126)
Anion gap: 12 (ref 5–15)
BUN: 21 mg/dL (ref 8–23)
CO2: 23 mmol/L (ref 22–32)
Calcium: 9.7 mg/dL (ref 8.9–10.3)
Chloride: 102 mmol/L (ref 98–111)
Creatinine, Ser: 1.08 mg/dL — ABNORMAL HIGH (ref 0.44–1.00)
GFR, Estimated: 51 mL/min — ABNORMAL LOW (ref >60)
Glucose, Bld: 112 mg/dL — ABNORMAL HIGH (ref 70–99)
Potassium: 3.6 mmol/L (ref 3.5–5.1)
Sodium: 137 mmol/L (ref 135–145)
Total Bilirubin: 0.6 mg/dL (ref 0.3–1.2)
Total Protein: 7.1 g/dL (ref 6.5–8.1)

## 2021-04-04 LAB — URINALYSIS, ROUTINE W REFLEX MICROSCOPIC
Bilirubin Urine: NEGATIVE
Glucose, UA: NEGATIVE mg/dL
Hgb urine dipstick: NEGATIVE
Ketones, ur: 15 mg/dL — AB
Leukocytes,Ua: NEGATIVE
Nitrite: NEGATIVE
Protein, ur: NEGATIVE mg/dL
Specific Gravity, Urine: 1.015 (ref 1.005–1.030)
pH: 6.5 (ref 5.0–8.0)

## 2021-04-04 LAB — MAGNESIUM: Magnesium: 1.8 mg/dL (ref 1.7–2.4)

## 2021-04-04 MED ORDER — DOCUSATE SODIUM 100 MG PO CAPS: 100.0000 mg | ORAL_CAPSULE | Freq: Two times a day (BID) | ORAL | 0 refills | Status: AC

## 2021-04-04 MED ORDER — ONDANSETRON 4 MG PO TBDP: ORAL_TABLET | ORAL | 0 refills | Status: AC

## 2021-04-04 MED ORDER — POLYETHYLENE GLYCOL 3350 17 G PO PACK: 17.0000 g | PACK | Freq: Every day | ORAL | 0 refills | Status: AC

## 2021-04-04 MED ORDER — FLEET ENEMA 7-19 GM/118ML RE ENEM: 1.0000 | ENEMA | Freq: Every day | RECTAL | 0 refills | Status: DC | PRN

## 2021-04-04 MED ORDER — LORAZEPAM 2 MG/ML IJ SOLN
0.5000 mg | Freq: Once | INTRAMUSCULAR | Status: AC
  Administered 2021-04-04: 0.5 mg via INTRAVENOUS
  Filled 2021-04-04: qty 1

## 2021-04-04 NOTE — Discharge Instructions (Addendum)
Patient has concerns that she has not received her correct dosing of ativan. If this is the case, I would expect her blood pressure and nausea to continue for 24-48 more hours. If this change has not occurred I would expect improvement in the next 12 hours or so (already improved quite a bit here).   She also appears dehydrated and slightly malnourished, please ensure she is eating and drinking appropriately.

## 2021-04-05 ENCOUNTER — Other Ambulatory Visit: Payer: Self-pay

## 2021-04-05 ENCOUNTER — Emergency Department (HOSPITAL_COMMUNITY)
Admission: EM | Admit: 2021-04-05 | Discharge: 2021-04-05 | Disposition: A | Discharge status: Home / Self Care | Payer: Medicare Other | Attending: Emergency Medicine | Admitting: Emergency Medicine

## 2021-04-05 ENCOUNTER — Encounter (HOSPITAL_COMMUNITY): Payer: Self-pay

## 2021-04-05 DIAGNOSIS — R109 Unspecified abdominal pain: Secondary | ICD-10-CM | POA: Diagnosis not present

## 2021-04-05 DIAGNOSIS — F32A Depression, unspecified: Secondary | ICD-10-CM | POA: Diagnosis not present

## 2021-04-05 DIAGNOSIS — Z743 Need for continuous supervision: Secondary | ICD-10-CM | POA: Diagnosis not present

## 2021-04-05 DIAGNOSIS — N390 Urinary tract infection, site not specified: Secondary | ICD-10-CM | POA: Insufficient documentation

## 2021-04-05 DIAGNOSIS — W1839XA Other fall on same level, initial encounter: Secondary | ICD-10-CM | POA: Diagnosis not present

## 2021-04-05 DIAGNOSIS — S0031XA Abrasion of nose, initial encounter: Secondary | ICD-10-CM | POA: Diagnosis not present

## 2021-04-05 DIAGNOSIS — E876 Hypokalemia: Secondary | ICD-10-CM | POA: Insufficient documentation

## 2021-04-05 DIAGNOSIS — R6889 Other general symptoms and signs: Secondary | ICD-10-CM | POA: Diagnosis not present

## 2021-04-05 DIAGNOSIS — Z7401 Bed confinement status: Secondary | ICD-10-CM | POA: Diagnosis not present

## 2021-04-05 DIAGNOSIS — S0083XA Contusion of other part of head, initial encounter: Secondary | ICD-10-CM | POA: Insufficient documentation

## 2021-04-05 DIAGNOSIS — S0993XA Unspecified injury of face, initial encounter: Secondary | ICD-10-CM | POA: Diagnosis present

## 2021-04-05 DIAGNOSIS — R531 Weakness: Secondary | ICD-10-CM | POA: Diagnosis not present

## 2021-04-05 LAB — URINALYSIS, ROUTINE W REFLEX MICROSCOPIC
Bilirubin Urine: NEGATIVE
Glucose, UA: NEGATIVE mg/dL
Ketones, ur: 5 mg/dL — AB
Nitrite: NEGATIVE
Protein, ur: NEGATIVE mg/dL
Specific Gravity, Urine: 1.004 — ABNORMAL LOW (ref 1.005–1.030)
pH: 7 (ref 5.0–8.0)

## 2021-04-05 LAB — CBC WITH DIFFERENTIAL/PLATELET
Abs Immature Granulocytes: 0.03 10*3/uL (ref 0.00–0.07)
Basophils Absolute: 0.1 10*3/uL (ref 0.0–0.1)
Basophils Relative: 1 %
Eosinophils Absolute: 0.1 10*3/uL (ref 0.0–0.5)
Eosinophils Relative: 1 %
HCT: 37.7 % (ref 36.0–46.0)
Hemoglobin: 12.7 g/dL (ref 12.0–15.0)
Immature Granulocytes: 0 %
Lymphocytes Relative: 16 %
Lymphs Abs: 1.5 10*3/uL (ref 0.7–4.0)
MCH: 31.1 pg (ref 26.0–34.0)
MCHC: 33.7 g/dL (ref 30.0–36.0)
MCV: 92.2 fL (ref 80.0–100.0)
Monocytes Absolute: 1.1 10*3/uL — ABNORMAL HIGH (ref 0.1–1.0)
Monocytes Relative: 11 %
Neutro Abs: 7.1 10*3/uL (ref 1.7–7.7)
Neutrophils Relative %: 71 %
Platelets: 241 10*3/uL (ref 150–400)
RBC: 4.09 MIL/uL (ref 3.87–5.11)
RDW: 12.9 % (ref 11.5–15.5)
WBC: 9.9 10*3/uL (ref 4.0–10.5)
nRBC: 0 % (ref 0.0–0.2)

## 2021-04-05 LAB — BASIC METABOLIC PANEL
Anion gap: 13 (ref 5–15)
BUN: 18 mg/dL (ref 8–23)
CO2: 23 mmol/L (ref 22–32)
Calcium: 9.9 mg/dL (ref 8.9–10.3)
Chloride: 98 mmol/L (ref 98–111)
Creatinine, Ser: 1.05 mg/dL — ABNORMAL HIGH (ref 0.44–1.00)
GFR, Estimated: 53 mL/min — ABNORMAL LOW (ref >60)
Glucose, Bld: 102 mg/dL — ABNORMAL HIGH (ref 70–99)
Potassium: 3.3 mmol/L — ABNORMAL LOW (ref 3.5–5.1)
Sodium: 134 mmol/L — ABNORMAL LOW (ref 135–145)

## 2021-04-05 MED ORDER — ACETAMINOPHEN 325 MG PO TABS
650.0000 mg | ORAL_TABLET | Freq: Once | ORAL | Status: AC
  Administered 2021-04-05: 650 mg via ORAL
  Filled 2021-04-05: qty 2

## 2021-04-05 MED ORDER — CEPHALEXIN 500 MG PO CAPS
500.0000 mg | ORAL_CAPSULE | Freq: Once | ORAL | Status: AC
  Administered 2021-04-05: 500 mg via ORAL
  Filled 2021-04-05: qty 1

## 2021-04-05 MED ORDER — CEPHALEXIN 500 MG PO CAPS: 500.0000 mg | ORAL_CAPSULE | Freq: Two times a day (BID) | ORAL | 0 refills | Status: AC

## 2021-04-05 NOTE — ED Notes (Signed)
Urine sample on beside table, no order yet!

## 2021-04-05 NOTE — ED Provider Notes (Signed)
[facility] DEPT Provider Note   CSN: [id number] Arrival date & time: 04/05/21  1827     History  Chief Complaint  Patient presents with   Suicidal    FTT    Lynn Price is a 81 y.o. female.  81 year old female brought in by EMS from assisted living with complaint of depression.  Patient states that she was depressed and does not want to be alive anymore however does not have plans to harm herself.  She speaks of her grandchildren and great-grandchildren with family nearby who she looks forward to seeing again soon.  Patient was seen here 2 days ago after a fall, is ambulatory with a walker.  States that she did not feel like eating her lunch today.      Home Medications Prior to Admission medications   Medication Sig Start Date End Date Taking? Authorizing Provider  cephALEXin (KEFLEX) 500 MG capsule Take 1 capsule (500 mg total) by mouth 2 (two) times daily for 5 days. 04/05/21 04/10/21 Yes Tacy Learn, PA-C  acetaminophen (TYLENOL) 325 MG tablet Take 325 mg by mouth See admin instructions. 325 mg twice a day and can take every 6 hours as needed for pain/headache    [provider]  acyclovir (ZOVIRAX) 400 MG tablet Take 400 mg by mouth 2 (two) times daily.     [provider]  diltiazem (CARDIZEM CD) 120 MG 24 hr capsule Take 120 mg by mouth daily.    [provider]  docusate sodium (COLACE) 100 MG capsule Take 1 capsule (100 mg total) by mouth every 12 (twelve) hours. 04/04/21   Mesner, Corene Cornea, MD  donepezil (ARICEPT) 5 MG tablet TAKE 1 TABLET BY MOUTH AT  BEDTIME Patient taking differently: Take 5 mg by mouth at bedtime. 09/24/20   Suzzanne Cloud, NP  gabapentin (NEURONTIN) 300 MG capsule Take 1 capsule by mouth at bedtime only. Patient taking differently: Take 300 mg by mouth at bedtime. 03/07/19   Bo Merino, MD  LORazepam (ATIVAN) 1 MG tablet Take 1 mg by mouth 2 (two) times daily as needed for anxiety.     [provider]  Omega-3 Fatty Acids (FISH OIL) 1200 MG CAPS Take 1,200 mg by mouth daily.     [provider]  ondansetron (ZOFRAN-ODT) 4 MG disintegrating tablet 4mg  ODT q4 hours prn nausea/vomit 04/04/21   Mesner, Corene Cornea, MD  polyethylene glycol (MIRALAX / GLYCOLAX) 17 g packet Take 17 g by mouth daily. 04/04/21   Mesner, Corene Cornea, MD  sodium phosphate (FLEET) 7-19 GM/118ML ENEM Place 133 mLs (1 enema total) rectally daily as needed for up to 5 doses for severe constipation. 04/04/21   Mesner, Corene Cornea, MD      Allergies    Erythromycin, Penicillins, Sulfa antibiotics, and Tetracyclines & related    Review of Systems   Review of Systems Negative except as per HPI Physical Exam Updated Vital Signs BP (!) 189/82    Pulse 70    Temp 98.2 F (36.8 C) (Oral)    Resp (!) 9    Ht 5\' 5"  (1.651 m)    Wt 38.6 kg    SpO2 100%    BMI 14.14 kg/m  Physical Exam Vitals and nursing note reviewed.  HENT:     Head: Normocephalic.     Comments: Abrasion to nose, bruising to face from recent fall    Mouth/Throat:     Mouth: Mucous membranes are dry.  Eyes:  Conjunctiva/sclera: Conjunctivae normal.  Cardiovascular:     Rate and Rhythm: Normal rate and regular rhythm.     Pulses: Normal pulses.     Heart sounds: Normal heart sounds.  Pulmonary:     Effort: Pulmonary effort is normal.     Breath sounds: Normal breath sounds.  Abdominal:     Palpations: Abdomen is soft.     Tenderness: There is no abdominal tenderness.  Musculoskeletal:     Cervical back: Neck supple.     Right lower leg: No edema.     Left lower leg: No edema.  Skin:    General: Skin is dry.     Findings: No erythema or rash.  Neurological:     General: No focal deficit present.  Psychiatric:        Mood and Affect: Mood is depressed.        Thought Content: Thought content does not include suicidal ideation. Thought content does not include suicidal plan.    ED Results / Procedures / Treatments   Labs (all  labs ordered are listed, but only abnormal results are displayed) Labs Reviewed  BASIC METABOLIC PANEL - Abnormal; Notable for the following components:      Result Value   Sodium 134 (*)    Potassium 3.3 (*)    Glucose, Bld 102 (*)    Creatinine, Ser 1.05 (*)    GFR, Estimated 53 (*)    All other components within normal limits  CBC WITH DIFFERENTIAL/PLATELET - Abnormal; Notable for the following components:   Monocytes Absolute 1.1 (*)    All other components within normal limits  URINALYSIS, ROUTINE W REFLEX MICROSCOPIC - Abnormal; Notable for the following components:   Color, Urine STRAW (*)    Specific Gravity, Urine 1.004 (*)    Hgb urine dipstick SMALL (*)    Ketones, ur 5 (*)    Leukocytes,Ua SMALL (*)    Bacteria, UA RARE (*)    All other components within normal limits    EKG None  Radiology CT Head Wo Contrast  Result Date: 04/04/2021 CLINICAL DATA:  Dizziness, persistent/recurrent. EXAM: CT HEAD WITHOUT CONTRAST TECHNIQUE: Contiguous axial images were obtained from the base of the skull through the vertex without intravenous contrast. RADIATION DOSE REDUCTION: This exam was performed according to the departmental dose-optimization program which includes automated exposure control, adjustment of the mA and/or kV according to patient size and/or use of iterative reconstruction technique. COMPARISON:  04/02/2021. FINDINGS: Brain: No acute intracranial hemorrhage, midline shift or mass effect. No extra-axial fluid collection. Periventricular white matter hypodensities are noted bilaterally. Mild atrophy is noted. There is no hydrocephalus. Vascular: Atherosclerotic calcification of the carotid siphons. No hyperdense vessel. Skull: Normal. Negative for fracture or focal lesion. Sinuses/Orbits: No acute finding. Other: None. IMPRESSION: Stable head CT with no acute intracranial process. Electronically Signed   By: Brett Fairy M.D.   On: 04/04/2021 04:42     Procedures Procedures    Medications Ordered in ED Medications  cephALEXin (KEFLEX) capsule 500 mg (has no administration in time range)    ED Course/ Medical Decision Making/ A&P                           Medical Decision Making Amount and/or Complexity of Data Reviewed Labs: ordered.  Risk Prescription drug management.   81 year old female sent by assisted living facility with concern for failure to thrive as patient did not want to eat her  lunch today.  Patient reports feeling depressed without plan for harm and looks forward to visiting her family including her children and grandchildren, great-grandchildren.  On exam, she has minor abrasion to her nose with some bruising to her face from a fall in which she was to the emergency room for 2 days ago. Labs are assessed and are without significant findings including CBC with normal white count, she is not anemic.  Her BMP shows a mild hypokalemia with a potassium of 3.3 otherwise her kidney function is stable.  Question slight UTI with small leukocytes and rare bacteria, will treat with Keflex. Recommend recheck with PCP for management of her depression. Case discussed with Dr. Pearline Cables, ER attending, agrees with plan of care.        Final Clinical Impression(s) / ED Diagnoses Final diagnoses:  Depression, unspecified depression type  Urinary tract infection without hematuria, site unspecified    Rx / DC Orders ED Discharge Orders          Ordered    cephALEXin (KEFLEX) 500 MG capsule  2 times daily        04/05/21 2055              Tacy Learn, PA-C 04/05/21 2100    Jeanell Sparrow, DO 04/07/21 0127

## 2021-04-05 NOTE — ED Notes (Signed)
Pt BIBA for FTT and SI/depression. A/ox4. States she wants God to take her.

## 2021-04-05 NOTE — ED Notes (Signed)
Pt placed on purewick 

## 2021-04-05 NOTE — ED Triage Notes (Signed)
Pt BIBA from assisted living for failure to thrive-not eating, drinking, taking meds and stating SI to staff. Pt a/ox4, states she wants God to take her

## 2021-04-05 NOTE — ED Notes (Signed)
Pt NAD, a/ox4. Pt verbalizes understanding of all DC and f/u instructions. All questions answered. Pt moved over to [facility] gurney by EMTs, transported back to facility.

## 2021-04-05 NOTE — ED Notes (Signed)
[facility] CONTACTED FOR PATIENT PATIENT PICK UP AND DELIVERY

## 2021-04-05 NOTE — Discharge Instructions (Addendum)
Keflex as prescribed for UTI. Recheck with your doctor for depression.

## 2021-04-05 NOTE — ED Notes (Signed)
Called [facility] to give report to Med Tech French Ana). French Ana took report.

## 2021-04-05 NOTE — ED Notes (Signed)
[facility] at bedside 

## 2021-04-05 NOTE — ED Notes (Signed)
[facility] called  

## 2021-04-05 NOTE — ED Notes (Signed)
Pt ambulates with walker as per baseline

## 2021-04-08 DIAGNOSIS — M170 Bilateral primary osteoarthritis of knee: Secondary | ICD-10-CM | POA: Diagnosis not present

## 2021-04-08 DIAGNOSIS — K5901 Slow transit constipation: Secondary | ICD-10-CM | POA: Diagnosis not present

## 2021-04-08 DIAGNOSIS — N390 Urinary tract infection, site not specified: Secondary | ICD-10-CM | POA: Diagnosis not present

## 2021-04-08 DIAGNOSIS — I10 Essential (primary) hypertension: Secondary | ICD-10-CM | POA: Diagnosis not present

## 2021-04-08 DIAGNOSIS — M797 Fibromyalgia: Secondary | ICD-10-CM | POA: Diagnosis not present

## 2021-04-09 DIAGNOSIS — Z96651 Presence of right artificial knee joint: Secondary | ICD-10-CM | POA: Diagnosis not present

## 2021-04-09 DIAGNOSIS — M19071 Primary osteoarthritis, right ankle and foot: Secondary | ICD-10-CM | POA: Diagnosis not present

## 2021-04-09 DIAGNOSIS — M797 Fibromyalgia: Secondary | ICD-10-CM | POA: Diagnosis not present

## 2021-04-09 DIAGNOSIS — M19042 Primary osteoarthritis, left hand: Secondary | ICD-10-CM | POA: Diagnosis not present

## 2021-04-09 DIAGNOSIS — I10 Essential (primary) hypertension: Secondary | ICD-10-CM | POA: Diagnosis not present

## 2021-04-09 DIAGNOSIS — Z792 Long term (current) use of antibiotics: Secondary | ICD-10-CM | POA: Diagnosis not present

## 2021-04-09 DIAGNOSIS — M5416 Radiculopathy, lumbar region: Secondary | ICD-10-CM | POA: Diagnosis not present

## 2021-04-09 DIAGNOSIS — F5101 Primary insomnia: Secondary | ICD-10-CM | POA: Diagnosis not present

## 2021-04-09 DIAGNOSIS — Z681 Body mass index (BMI) 19 or less, adult: Secondary | ICD-10-CM | POA: Diagnosis not present

## 2021-04-09 DIAGNOSIS — M19041 Primary osteoarthritis, right hand: Secondary | ICD-10-CM | POA: Diagnosis not present

## 2021-04-09 DIAGNOSIS — G8929 Other chronic pain: Secondary | ICD-10-CM | POA: Diagnosis not present

## 2021-04-09 DIAGNOSIS — E860 Dehydration: Secondary | ICD-10-CM | POA: Diagnosis not present

## 2021-04-09 DIAGNOSIS — M19072 Primary osteoarthritis, left ankle and foot: Secondary | ICD-10-CM | POA: Diagnosis not present

## 2021-04-09 DIAGNOSIS — Z9181 History of falling: Secondary | ICD-10-CM | POA: Diagnosis not present

## 2021-04-09 DIAGNOSIS — G4733 Obstructive sleep apnea (adult) (pediatric): Secondary | ICD-10-CM | POA: Diagnosis not present

## 2021-04-09 DIAGNOSIS — M1712 Unilateral primary osteoarthritis, left knee: Secondary | ICD-10-CM | POA: Diagnosis not present

## 2021-04-09 DIAGNOSIS — G629 Polyneuropathy, unspecified: Secondary | ICD-10-CM | POA: Diagnosis not present

## 2021-04-16 DIAGNOSIS — E860 Dehydration: Secondary | ICD-10-CM | POA: Diagnosis not present

## 2021-04-16 DIAGNOSIS — G4733 Obstructive sleep apnea (adult) (pediatric): Secondary | ICD-10-CM | POA: Diagnosis not present

## 2021-04-16 DIAGNOSIS — Z681 Body mass index (BMI) 19 or less, adult: Secondary | ICD-10-CM | POA: Diagnosis not present

## 2021-04-16 DIAGNOSIS — M19041 Primary osteoarthritis, right hand: Secondary | ICD-10-CM | POA: Diagnosis not present

## 2021-04-16 DIAGNOSIS — F5101 Primary insomnia: Secondary | ICD-10-CM | POA: Diagnosis not present

## 2021-04-16 DIAGNOSIS — M1712 Unilateral primary osteoarthritis, left knee: Secondary | ICD-10-CM | POA: Diagnosis not present

## 2021-04-16 DIAGNOSIS — Z792 Long term (current) use of antibiotics: Secondary | ICD-10-CM | POA: Diagnosis not present

## 2021-04-16 DIAGNOSIS — M5416 Radiculopathy, lumbar region: Secondary | ICD-10-CM | POA: Diagnosis not present

## 2021-04-16 DIAGNOSIS — M19072 Primary osteoarthritis, left ankle and foot: Secondary | ICD-10-CM | POA: Diagnosis not present

## 2021-04-16 DIAGNOSIS — M797 Fibromyalgia: Secondary | ICD-10-CM | POA: Diagnosis not present

## 2021-04-16 DIAGNOSIS — M19042 Primary osteoarthritis, left hand: Secondary | ICD-10-CM | POA: Diagnosis not present

## 2021-04-16 DIAGNOSIS — M19071 Primary osteoarthritis, right ankle and foot: Secondary | ICD-10-CM | POA: Diagnosis not present

## 2021-04-16 DIAGNOSIS — G8929 Other chronic pain: Secondary | ICD-10-CM | POA: Diagnosis not present

## 2021-04-16 DIAGNOSIS — Z9181 History of falling: Secondary | ICD-10-CM | POA: Diagnosis not present

## 2021-04-16 DIAGNOSIS — G629 Polyneuropathy, unspecified: Secondary | ICD-10-CM | POA: Diagnosis not present

## 2021-04-16 DIAGNOSIS — Z96651 Presence of right artificial knee joint: Secondary | ICD-10-CM | POA: Diagnosis not present

## 2021-04-16 DIAGNOSIS — I10 Essential (primary) hypertension: Secondary | ICD-10-CM | POA: Diagnosis not present

## 2021-04-20 DIAGNOSIS — I10 Essential (primary) hypertension: Secondary | ICD-10-CM | POA: Diagnosis not present

## 2021-04-20 DIAGNOSIS — E860 Dehydration: Secondary | ICD-10-CM | POA: Diagnosis not present

## 2021-04-20 DIAGNOSIS — M5416 Radiculopathy, lumbar region: Secondary | ICD-10-CM | POA: Diagnosis not present

## 2021-04-20 DIAGNOSIS — M19041 Primary osteoarthritis, right hand: Secondary | ICD-10-CM | POA: Diagnosis not present

## 2021-04-20 DIAGNOSIS — Z9181 History of falling: Secondary | ICD-10-CM | POA: Diagnosis not present

## 2021-04-20 DIAGNOSIS — M19042 Primary osteoarthritis, left hand: Secondary | ICD-10-CM | POA: Diagnosis not present

## 2021-04-20 DIAGNOSIS — M797 Fibromyalgia: Secondary | ICD-10-CM | POA: Diagnosis not present

## 2021-04-20 DIAGNOSIS — Z96651 Presence of right artificial knee joint: Secondary | ICD-10-CM | POA: Diagnosis not present

## 2021-04-20 DIAGNOSIS — G629 Polyneuropathy, unspecified: Secondary | ICD-10-CM | POA: Diagnosis not present

## 2021-04-20 DIAGNOSIS — G4733 Obstructive sleep apnea (adult) (pediatric): Secondary | ICD-10-CM | POA: Diagnosis not present

## 2021-04-20 DIAGNOSIS — M19071 Primary osteoarthritis, right ankle and foot: Secondary | ICD-10-CM | POA: Diagnosis not present

## 2021-04-20 DIAGNOSIS — M19072 Primary osteoarthritis, left ankle and foot: Secondary | ICD-10-CM | POA: Diagnosis not present

## 2021-04-20 DIAGNOSIS — F5101 Primary insomnia: Secondary | ICD-10-CM | POA: Diagnosis not present

## 2021-04-20 DIAGNOSIS — Z681 Body mass index (BMI) 19 or less, adult: Secondary | ICD-10-CM | POA: Diagnosis not present

## 2021-04-20 DIAGNOSIS — Z792 Long term (current) use of antibiotics: Secondary | ICD-10-CM | POA: Diagnosis not present

## 2021-04-20 DIAGNOSIS — G8929 Other chronic pain: Secondary | ICD-10-CM | POA: Diagnosis not present

## 2021-04-20 DIAGNOSIS — M1712 Unilateral primary osteoarthritis, left knee: Secondary | ICD-10-CM | POA: Diagnosis not present

## 2021-04-22 DIAGNOSIS — Z9181 History of falling: Secondary | ICD-10-CM | POA: Diagnosis not present

## 2021-04-22 DIAGNOSIS — F5101 Primary insomnia: Secondary | ICD-10-CM | POA: Diagnosis not present

## 2021-04-22 DIAGNOSIS — M19041 Primary osteoarthritis, right hand: Secondary | ICD-10-CM | POA: Diagnosis not present

## 2021-04-22 DIAGNOSIS — M797 Fibromyalgia: Secondary | ICD-10-CM | POA: Diagnosis not present

## 2021-04-22 DIAGNOSIS — G629 Polyneuropathy, unspecified: Secondary | ICD-10-CM | POA: Diagnosis not present

## 2021-04-22 DIAGNOSIS — M5416 Radiculopathy, lumbar region: Secondary | ICD-10-CM | POA: Diagnosis not present

## 2021-04-22 DIAGNOSIS — I10 Essential (primary) hypertension: Secondary | ICD-10-CM | POA: Diagnosis not present

## 2021-04-22 DIAGNOSIS — G8929 Other chronic pain: Secondary | ICD-10-CM | POA: Diagnosis not present

## 2021-04-22 DIAGNOSIS — G4733 Obstructive sleep apnea (adult) (pediatric): Secondary | ICD-10-CM | POA: Diagnosis not present

## 2021-04-22 DIAGNOSIS — Z681 Body mass index (BMI) 19 or less, adult: Secondary | ICD-10-CM | POA: Diagnosis not present

## 2021-04-22 DIAGNOSIS — M1712 Unilateral primary osteoarthritis, left knee: Secondary | ICD-10-CM | POA: Diagnosis not present

## 2021-04-22 DIAGNOSIS — M19072 Primary osteoarthritis, left ankle and foot: Secondary | ICD-10-CM | POA: Diagnosis not present

## 2021-04-22 DIAGNOSIS — Z96651 Presence of right artificial knee joint: Secondary | ICD-10-CM | POA: Diagnosis not present

## 2021-04-22 DIAGNOSIS — M19071 Primary osteoarthritis, right ankle and foot: Secondary | ICD-10-CM | POA: Diagnosis not present

## 2021-04-22 DIAGNOSIS — E860 Dehydration: Secondary | ICD-10-CM | POA: Diagnosis not present

## 2021-04-22 DIAGNOSIS — Z792 Long term (current) use of antibiotics: Secondary | ICD-10-CM | POA: Diagnosis not present

## 2021-04-22 DIAGNOSIS — M19042 Primary osteoarthritis, left hand: Secondary | ICD-10-CM | POA: Diagnosis not present

## 2021-04-27 DIAGNOSIS — M1712 Unilateral primary osteoarthritis, left knee: Secondary | ICD-10-CM | POA: Diagnosis not present

## 2021-04-27 DIAGNOSIS — M5416 Radiculopathy, lumbar region: Secondary | ICD-10-CM | POA: Diagnosis not present

## 2021-04-27 DIAGNOSIS — G4733 Obstructive sleep apnea (adult) (pediatric): Secondary | ICD-10-CM | POA: Diagnosis not present

## 2021-04-27 DIAGNOSIS — I10 Essential (primary) hypertension: Secondary | ICD-10-CM | POA: Diagnosis not present

## 2021-04-27 DIAGNOSIS — Z9181 History of falling: Secondary | ICD-10-CM | POA: Diagnosis not present

## 2021-04-27 DIAGNOSIS — Z681 Body mass index (BMI) 19 or less, adult: Secondary | ICD-10-CM | POA: Diagnosis not present

## 2021-04-27 DIAGNOSIS — Z96651 Presence of right artificial knee joint: Secondary | ICD-10-CM | POA: Diagnosis not present

## 2021-04-27 DIAGNOSIS — Z792 Long term (current) use of antibiotics: Secondary | ICD-10-CM | POA: Diagnosis not present

## 2021-04-27 DIAGNOSIS — G8929 Other chronic pain: Secondary | ICD-10-CM | POA: Diagnosis not present

## 2021-04-27 DIAGNOSIS — M19071 Primary osteoarthritis, right ankle and foot: Secondary | ICD-10-CM | POA: Diagnosis not present

## 2021-04-27 DIAGNOSIS — F5101 Primary insomnia: Secondary | ICD-10-CM | POA: Diagnosis not present

## 2021-04-27 DIAGNOSIS — G629 Polyneuropathy, unspecified: Secondary | ICD-10-CM | POA: Diagnosis not present

## 2021-04-27 DIAGNOSIS — M19072 Primary osteoarthritis, left ankle and foot: Secondary | ICD-10-CM | POA: Diagnosis not present

## 2021-04-27 DIAGNOSIS — M797 Fibromyalgia: Secondary | ICD-10-CM | POA: Diagnosis not present

## 2021-04-27 DIAGNOSIS — M19041 Primary osteoarthritis, right hand: Secondary | ICD-10-CM | POA: Diagnosis not present

## 2021-04-27 DIAGNOSIS — E860 Dehydration: Secondary | ICD-10-CM | POA: Diagnosis not present

## 2021-04-27 DIAGNOSIS — M19042 Primary osteoarthritis, left hand: Secondary | ICD-10-CM | POA: Diagnosis not present

## 2021-04-29 DIAGNOSIS — D519 Vitamin B12 deficiency anemia, unspecified: Secondary | ICD-10-CM | POA: Diagnosis not present

## 2021-04-29 DIAGNOSIS — M5416 Radiculopathy, lumbar region: Secondary | ICD-10-CM | POA: Diagnosis not present

## 2021-04-29 DIAGNOSIS — F5101 Primary insomnia: Secondary | ICD-10-CM | POA: Diagnosis not present

## 2021-04-29 DIAGNOSIS — G629 Polyneuropathy, unspecified: Secondary | ICD-10-CM | POA: Diagnosis not present

## 2021-04-29 DIAGNOSIS — Z792 Long term (current) use of antibiotics: Secondary | ICD-10-CM | POA: Diagnosis not present

## 2021-04-29 DIAGNOSIS — E559 Vitamin D deficiency, unspecified: Secondary | ICD-10-CM | POA: Diagnosis not present

## 2021-04-29 DIAGNOSIS — M19042 Primary osteoarthritis, left hand: Secondary | ICD-10-CM | POA: Diagnosis not present

## 2021-04-29 DIAGNOSIS — G4733 Obstructive sleep apnea (adult) (pediatric): Secondary | ICD-10-CM | POA: Diagnosis not present

## 2021-04-29 DIAGNOSIS — M19072 Primary osteoarthritis, left ankle and foot: Secondary | ICD-10-CM | POA: Diagnosis not present

## 2021-04-29 DIAGNOSIS — M19071 Primary osteoarthritis, right ankle and foot: Secondary | ICD-10-CM | POA: Diagnosis not present

## 2021-04-29 DIAGNOSIS — M1712 Unilateral primary osteoarthritis, left knee: Secondary | ICD-10-CM | POA: Diagnosis not present

## 2021-04-29 DIAGNOSIS — M797 Fibromyalgia: Secondary | ICD-10-CM | POA: Diagnosis not present

## 2021-04-29 DIAGNOSIS — E039 Hypothyroidism, unspecified: Secondary | ICD-10-CM | POA: Diagnosis not present

## 2021-04-29 DIAGNOSIS — G8929 Other chronic pain: Secondary | ICD-10-CM | POA: Diagnosis not present

## 2021-04-29 DIAGNOSIS — Z96651 Presence of right artificial knee joint: Secondary | ICD-10-CM | POA: Diagnosis not present

## 2021-04-29 DIAGNOSIS — E860 Dehydration: Secondary | ICD-10-CM | POA: Diagnosis not present

## 2021-04-29 DIAGNOSIS — D508 Other iron deficiency anemias: Secondary | ICD-10-CM | POA: Diagnosis not present

## 2021-04-29 DIAGNOSIS — I10 Essential (primary) hypertension: Secondary | ICD-10-CM | POA: Diagnosis not present

## 2021-04-29 DIAGNOSIS — M19041 Primary osteoarthritis, right hand: Secondary | ICD-10-CM | POA: Diagnosis not present

## 2021-04-29 DIAGNOSIS — Z9181 History of falling: Secondary | ICD-10-CM | POA: Diagnosis not present

## 2021-04-29 DIAGNOSIS — E08311 Diabetes mellitus due to underlying condition with unspecified diabetic retinopathy with macular edema: Secondary | ICD-10-CM | POA: Diagnosis not present

## 2021-04-29 DIAGNOSIS — Z681 Body mass index (BMI) 19 or less, adult: Secondary | ICD-10-CM | POA: Diagnosis not present

## 2021-05-04 DIAGNOSIS — Z681 Body mass index (BMI) 19 or less, adult: Secondary | ICD-10-CM | POA: Diagnosis not present

## 2021-05-04 DIAGNOSIS — M797 Fibromyalgia: Secondary | ICD-10-CM | POA: Diagnosis not present

## 2021-05-04 DIAGNOSIS — Z96651 Presence of right artificial knee joint: Secondary | ICD-10-CM | POA: Diagnosis not present

## 2021-05-04 DIAGNOSIS — M1712 Unilateral primary osteoarthritis, left knee: Secondary | ICD-10-CM | POA: Diagnosis not present

## 2021-05-04 DIAGNOSIS — G629 Polyneuropathy, unspecified: Secondary | ICD-10-CM | POA: Diagnosis not present

## 2021-05-04 DIAGNOSIS — M19072 Primary osteoarthritis, left ankle and foot: Secondary | ICD-10-CM | POA: Diagnosis not present

## 2021-05-04 DIAGNOSIS — Z9181 History of falling: Secondary | ICD-10-CM | POA: Diagnosis not present

## 2021-05-04 DIAGNOSIS — I10 Essential (primary) hypertension: Secondary | ICD-10-CM | POA: Diagnosis not present

## 2021-05-04 DIAGNOSIS — M5416 Radiculopathy, lumbar region: Secondary | ICD-10-CM | POA: Diagnosis not present

## 2021-05-04 DIAGNOSIS — Z792 Long term (current) use of antibiotics: Secondary | ICD-10-CM | POA: Diagnosis not present

## 2021-05-04 DIAGNOSIS — F5101 Primary insomnia: Secondary | ICD-10-CM | POA: Diagnosis not present

## 2021-05-04 DIAGNOSIS — G8929 Other chronic pain: Secondary | ICD-10-CM | POA: Diagnosis not present

## 2021-05-04 DIAGNOSIS — M19041 Primary osteoarthritis, right hand: Secondary | ICD-10-CM | POA: Diagnosis not present

## 2021-05-04 DIAGNOSIS — E860 Dehydration: Secondary | ICD-10-CM | POA: Diagnosis not present

## 2021-05-04 DIAGNOSIS — M19071 Primary osteoarthritis, right ankle and foot: Secondary | ICD-10-CM | POA: Diagnosis not present

## 2021-05-04 DIAGNOSIS — M19042 Primary osteoarthritis, left hand: Secondary | ICD-10-CM | POA: Diagnosis not present

## 2021-05-04 DIAGNOSIS — G4733 Obstructive sleep apnea (adult) (pediatric): Secondary | ICD-10-CM | POA: Diagnosis not present

## 2021-05-06 DIAGNOSIS — M19042 Primary osteoarthritis, left hand: Secondary | ICD-10-CM | POA: Diagnosis not present

## 2021-05-06 DIAGNOSIS — Z681 Body mass index (BMI) 19 or less, adult: Secondary | ICD-10-CM | POA: Diagnosis not present

## 2021-05-06 DIAGNOSIS — G629 Polyneuropathy, unspecified: Secondary | ICD-10-CM | POA: Diagnosis not present

## 2021-05-06 DIAGNOSIS — M19072 Primary osteoarthritis, left ankle and foot: Secondary | ICD-10-CM | POA: Diagnosis not present

## 2021-05-06 DIAGNOSIS — E860 Dehydration: Secondary | ICD-10-CM | POA: Diagnosis not present

## 2021-05-06 DIAGNOSIS — Z9181 History of falling: Secondary | ICD-10-CM | POA: Diagnosis not present

## 2021-05-06 DIAGNOSIS — M19041 Primary osteoarthritis, right hand: Secondary | ICD-10-CM | POA: Diagnosis not present

## 2021-05-06 DIAGNOSIS — G8929 Other chronic pain: Secondary | ICD-10-CM | POA: Diagnosis not present

## 2021-05-06 DIAGNOSIS — M19071 Primary osteoarthritis, right ankle and foot: Secondary | ICD-10-CM | POA: Diagnosis not present

## 2021-05-06 DIAGNOSIS — G4733 Obstructive sleep apnea (adult) (pediatric): Secondary | ICD-10-CM | POA: Diagnosis not present

## 2021-05-06 DIAGNOSIS — Z96651 Presence of right artificial knee joint: Secondary | ICD-10-CM | POA: Diagnosis not present

## 2021-05-06 DIAGNOSIS — M5416 Radiculopathy, lumbar region: Secondary | ICD-10-CM | POA: Diagnosis not present

## 2021-05-06 DIAGNOSIS — M797 Fibromyalgia: Secondary | ICD-10-CM | POA: Diagnosis not present

## 2021-05-06 DIAGNOSIS — Z792 Long term (current) use of antibiotics: Secondary | ICD-10-CM | POA: Diagnosis not present

## 2021-05-06 DIAGNOSIS — M1712 Unilateral primary osteoarthritis, left knee: Secondary | ICD-10-CM | POA: Diagnosis not present

## 2021-05-06 DIAGNOSIS — I10 Essential (primary) hypertension: Secondary | ICD-10-CM | POA: Diagnosis not present

## 2021-05-06 DIAGNOSIS — F5101 Primary insomnia: Secondary | ICD-10-CM | POA: Diagnosis not present

## 2021-05-10 DIAGNOSIS — I129 Hypertensive chronic kidney disease with stage 1 through stage 4 chronic kidney disease, or unspecified chronic kidney disease: Secondary | ICD-10-CM | POA: Diagnosis not present

## 2021-05-10 DIAGNOSIS — E7800 Pure hypercholesterolemia, unspecified: Secondary | ICD-10-CM | POA: Diagnosis not present

## 2021-05-10 DIAGNOSIS — N183 Chronic kidney disease, stage 3 unspecified: Secondary | ICD-10-CM | POA: Diagnosis not present

## 2021-05-10 DIAGNOSIS — K2100 Gastro-esophageal reflux disease with esophagitis, without bleeding: Secondary | ICD-10-CM | POA: Diagnosis not present

## 2021-05-11 DIAGNOSIS — Z681 Body mass index (BMI) 19 or less, adult: Secondary | ICD-10-CM | POA: Diagnosis not present

## 2021-05-11 DIAGNOSIS — M19042 Primary osteoarthritis, left hand: Secondary | ICD-10-CM | POA: Diagnosis not present

## 2021-05-11 DIAGNOSIS — M19071 Primary osteoarthritis, right ankle and foot: Secondary | ICD-10-CM | POA: Diagnosis not present

## 2021-05-11 DIAGNOSIS — G629 Polyneuropathy, unspecified: Secondary | ICD-10-CM | POA: Diagnosis not present

## 2021-05-11 DIAGNOSIS — G8929 Other chronic pain: Secondary | ICD-10-CM | POA: Diagnosis not present

## 2021-05-11 DIAGNOSIS — M1712 Unilateral primary osteoarthritis, left knee: Secondary | ICD-10-CM | POA: Diagnosis not present

## 2021-05-11 DIAGNOSIS — Z9181 History of falling: Secondary | ICD-10-CM | POA: Diagnosis not present

## 2021-05-11 DIAGNOSIS — E860 Dehydration: Secondary | ICD-10-CM | POA: Diagnosis not present

## 2021-05-11 DIAGNOSIS — F5101 Primary insomnia: Secondary | ICD-10-CM | POA: Diagnosis not present

## 2021-05-11 DIAGNOSIS — M797 Fibromyalgia: Secondary | ICD-10-CM | POA: Diagnosis not present

## 2021-05-11 DIAGNOSIS — M5416 Radiculopathy, lumbar region: Secondary | ICD-10-CM | POA: Diagnosis not present

## 2021-05-11 DIAGNOSIS — Z96651 Presence of right artificial knee joint: Secondary | ICD-10-CM | POA: Diagnosis not present

## 2021-05-11 DIAGNOSIS — Z792 Long term (current) use of antibiotics: Secondary | ICD-10-CM | POA: Diagnosis not present

## 2021-05-11 DIAGNOSIS — G4733 Obstructive sleep apnea (adult) (pediatric): Secondary | ICD-10-CM | POA: Diagnosis not present

## 2021-05-11 DIAGNOSIS — M19072 Primary osteoarthritis, left ankle and foot: Secondary | ICD-10-CM | POA: Diagnosis not present

## 2021-05-11 DIAGNOSIS — I10 Essential (primary) hypertension: Secondary | ICD-10-CM | POA: Diagnosis not present

## 2021-05-11 DIAGNOSIS — M19041 Primary osteoarthritis, right hand: Secondary | ICD-10-CM | POA: Diagnosis not present

## 2021-05-13 DIAGNOSIS — M5416 Radiculopathy, lumbar region: Secondary | ICD-10-CM | POA: Diagnosis not present

## 2021-05-13 DIAGNOSIS — Z681 Body mass index (BMI) 19 or less, adult: Secondary | ICD-10-CM | POA: Diagnosis not present

## 2021-05-13 DIAGNOSIS — Z9181 History of falling: Secondary | ICD-10-CM | POA: Diagnosis not present

## 2021-05-13 DIAGNOSIS — F5101 Primary insomnia: Secondary | ICD-10-CM | POA: Diagnosis not present

## 2021-05-13 DIAGNOSIS — Z792 Long term (current) use of antibiotics: Secondary | ICD-10-CM | POA: Diagnosis not present

## 2021-05-13 DIAGNOSIS — G4733 Obstructive sleep apnea (adult) (pediatric): Secondary | ICD-10-CM | POA: Diagnosis not present

## 2021-05-13 DIAGNOSIS — M797 Fibromyalgia: Secondary | ICD-10-CM | POA: Diagnosis not present

## 2021-05-13 DIAGNOSIS — G8929 Other chronic pain: Secondary | ICD-10-CM | POA: Diagnosis not present

## 2021-05-13 DIAGNOSIS — M19041 Primary osteoarthritis, right hand: Secondary | ICD-10-CM | POA: Diagnosis not present

## 2021-05-13 DIAGNOSIS — E860 Dehydration: Secondary | ICD-10-CM | POA: Diagnosis not present

## 2021-05-13 DIAGNOSIS — M19072 Primary osteoarthritis, left ankle and foot: Secondary | ICD-10-CM | POA: Diagnosis not present

## 2021-05-13 DIAGNOSIS — M19071 Primary osteoarthritis, right ankle and foot: Secondary | ICD-10-CM | POA: Diagnosis not present

## 2021-05-13 DIAGNOSIS — M1712 Unilateral primary osteoarthritis, left knee: Secondary | ICD-10-CM | POA: Diagnosis not present

## 2021-05-13 DIAGNOSIS — I10 Essential (primary) hypertension: Secondary | ICD-10-CM | POA: Diagnosis not present

## 2021-05-13 DIAGNOSIS — G629 Polyneuropathy, unspecified: Secondary | ICD-10-CM | POA: Diagnosis not present

## 2021-05-13 DIAGNOSIS — M19042 Primary osteoarthritis, left hand: Secondary | ICD-10-CM | POA: Diagnosis not present

## 2021-05-13 DIAGNOSIS — Z96651 Presence of right artificial knee joint: Secondary | ICD-10-CM | POA: Diagnosis not present

## 2021-05-18 DIAGNOSIS — Z792 Long term (current) use of antibiotics: Secondary | ICD-10-CM | POA: Diagnosis not present

## 2021-05-18 DIAGNOSIS — M5416 Radiculopathy, lumbar region: Secondary | ICD-10-CM | POA: Diagnosis not present

## 2021-05-18 DIAGNOSIS — I10 Essential (primary) hypertension: Secondary | ICD-10-CM | POA: Diagnosis not present

## 2021-05-18 DIAGNOSIS — M19042 Primary osteoarthritis, left hand: Secondary | ICD-10-CM | POA: Diagnosis not present

## 2021-05-18 DIAGNOSIS — Z681 Body mass index (BMI) 19 or less, adult: Secondary | ICD-10-CM | POA: Diagnosis not present

## 2021-05-18 DIAGNOSIS — G8929 Other chronic pain: Secondary | ICD-10-CM | POA: Diagnosis not present

## 2021-05-18 DIAGNOSIS — M1712 Unilateral primary osteoarthritis, left knee: Secondary | ICD-10-CM | POA: Diagnosis not present

## 2021-05-18 DIAGNOSIS — E860 Dehydration: Secondary | ICD-10-CM | POA: Diagnosis not present

## 2021-05-18 DIAGNOSIS — M797 Fibromyalgia: Secondary | ICD-10-CM | POA: Diagnosis not present

## 2021-05-18 DIAGNOSIS — G629 Polyneuropathy, unspecified: Secondary | ICD-10-CM | POA: Diagnosis not present

## 2021-05-18 DIAGNOSIS — Z9181 History of falling: Secondary | ICD-10-CM | POA: Diagnosis not present

## 2021-05-18 DIAGNOSIS — M19071 Primary osteoarthritis, right ankle and foot: Secondary | ICD-10-CM | POA: Diagnosis not present

## 2021-05-18 DIAGNOSIS — F5101 Primary insomnia: Secondary | ICD-10-CM | POA: Diagnosis not present

## 2021-05-18 DIAGNOSIS — Z96651 Presence of right artificial knee joint: Secondary | ICD-10-CM | POA: Diagnosis not present

## 2021-05-18 DIAGNOSIS — G4733 Obstructive sleep apnea (adult) (pediatric): Secondary | ICD-10-CM | POA: Diagnosis not present

## 2021-05-18 DIAGNOSIS — M19041 Primary osteoarthritis, right hand: Secondary | ICD-10-CM | POA: Diagnosis not present

## 2021-05-18 DIAGNOSIS — M19072 Primary osteoarthritis, left ankle and foot: Secondary | ICD-10-CM | POA: Diagnosis not present

## 2021-05-21 ENCOUNTER — Encounter (HOSPITAL_BASED_OUTPATIENT_CLINIC_OR_DEPARTMENT_OTHER): Payer: Self-pay | Admitting: Obstetrics and Gynecology

## 2021-05-21 ENCOUNTER — Emergency Department (HOSPITAL_BASED_OUTPATIENT_CLINIC_OR_DEPARTMENT_OTHER): Payer: Medicare Other

## 2021-05-21 ENCOUNTER — Other Ambulatory Visit: Payer: Self-pay

## 2021-05-21 ENCOUNTER — Inpatient Hospital Stay (HOSPITAL_BASED_OUTPATIENT_CLINIC_OR_DEPARTMENT_OTHER)
Admission: EM | Admit: 2021-05-21 | Discharge: 2021-05-27 | DRG: 312 | Disposition: A | Discharge status: Other Acute Inpatient Hospital | Payer: Medicare Other | Attending: Internal Medicine | Admitting: Internal Medicine

## 2021-05-21 DIAGNOSIS — G2581 Restless legs syndrome: Secondary | ICD-10-CM | POA: Diagnosis not present

## 2021-05-21 DIAGNOSIS — I10 Essential (primary) hypertension: Secondary | ICD-10-CM | POA: Diagnosis present

## 2021-05-21 DIAGNOSIS — I951 Orthostatic hypotension: Principal | ICD-10-CM

## 2021-05-21 DIAGNOSIS — Z880 Allergy status to penicillin: Secondary | ICD-10-CM | POA: Diagnosis not present

## 2021-05-21 DIAGNOSIS — K2100 Gastro-esophageal reflux disease with esophagitis, without bleeding: Secondary | ICD-10-CM | POA: Diagnosis present

## 2021-05-21 DIAGNOSIS — R413 Other amnesia: Secondary | ICD-10-CM | POA: Diagnosis present

## 2021-05-21 DIAGNOSIS — R55 Syncope and collapse: Secondary | ICD-10-CM | POA: Diagnosis present

## 2021-05-21 DIAGNOSIS — K589 Irritable bowel syndrome without diarrhea: Secondary | ICD-10-CM | POA: Diagnosis not present

## 2021-05-21 DIAGNOSIS — Z20822 Contact with and (suspected) exposure to covid-19: Secondary | ICD-10-CM | POA: Diagnosis present

## 2021-05-21 DIAGNOSIS — Z043 Encounter for examination and observation following other accident: Secondary | ICD-10-CM | POA: Diagnosis not present

## 2021-05-21 DIAGNOSIS — E441 Mild protein-calorie malnutrition: Secondary | ICD-10-CM | POA: Diagnosis not present

## 2021-05-21 DIAGNOSIS — R296 Repeated falls: Secondary | ICD-10-CM | POA: Diagnosis present

## 2021-05-21 DIAGNOSIS — M7918 Myalgia, other site: Principal | ICD-10-CM

## 2021-05-21 DIAGNOSIS — M797 Fibromyalgia: Secondary | ICD-10-CM | POA: Diagnosis present

## 2021-05-21 DIAGNOSIS — Z818 Family history of other mental and behavioral disorders: Secondary | ICD-10-CM

## 2021-05-21 DIAGNOSIS — Z79899 Other long term (current) drug therapy: Secondary | ICD-10-CM

## 2021-05-21 DIAGNOSIS — Z882 Allergy status to sulfonamides status: Secondary | ICD-10-CM

## 2021-05-21 DIAGNOSIS — R079 Chest pain, unspecified: Secondary | ICD-10-CM

## 2021-05-21 DIAGNOSIS — R0602 Shortness of breath: Secondary | ICD-10-CM | POA: Diagnosis not present

## 2021-05-21 DIAGNOSIS — Z96651 Presence of right artificial knee joint: Secondary | ICD-10-CM | POA: Diagnosis present

## 2021-05-21 DIAGNOSIS — F419 Anxiety disorder, unspecified: Secondary | ICD-10-CM | POA: Diagnosis not present

## 2021-05-21 DIAGNOSIS — R45851 Suicidal ideations: Secondary | ICD-10-CM

## 2021-05-21 DIAGNOSIS — I700 Atherosclerosis of aorta: Secondary | ICD-10-CM | POA: Diagnosis present

## 2021-05-21 DIAGNOSIS — Z681 Body mass index (BMI) 19 or less, adult: Secondary | ICD-10-CM | POA: Diagnosis not present

## 2021-05-21 DIAGNOSIS — F322 Major depressive disorder, single episode, severe without psychotic features: Secondary | ICD-10-CM | POA: Diagnosis present

## 2021-05-21 DIAGNOSIS — F0394 Unspecified dementia, unspecified severity, with anxiety: Secondary | ICD-10-CM | POA: Diagnosis not present

## 2021-05-21 DIAGNOSIS — F0393 Unspecified dementia, unspecified severity, with mood disturbance: Secondary | ICD-10-CM | POA: Diagnosis not present

## 2021-05-21 DIAGNOSIS — N1831 Chronic kidney disease, stage 3a: Secondary | ICD-10-CM | POA: Diagnosis present

## 2021-05-21 DIAGNOSIS — M1712 Unilateral primary osteoarthritis, left knee: Secondary | ICD-10-CM | POA: Diagnosis not present

## 2021-05-21 DIAGNOSIS — N183 Chronic kidney disease, stage 3 unspecified: Secondary | ICD-10-CM | POA: Diagnosis present

## 2021-05-21 DIAGNOSIS — I129 Hypertensive chronic kidney disease with stage 1 through stage 4 chronic kidney disease, or unspecified chronic kidney disease: Secondary | ICD-10-CM | POA: Diagnosis present

## 2021-05-21 DIAGNOSIS — G629 Polyneuropathy, unspecified: Secondary | ICD-10-CM | POA: Diagnosis not present

## 2021-05-21 DIAGNOSIS — G4733 Obstructive sleep apnea (adult) (pediatric): Secondary | ICD-10-CM | POA: Diagnosis present

## 2021-05-21 DIAGNOSIS — M545 Low back pain, unspecified: Secondary | ICD-10-CM | POA: Diagnosis not present

## 2021-05-21 DIAGNOSIS — Z881 Allergy status to other antibiotic agents status: Secondary | ICD-10-CM

## 2021-05-21 DIAGNOSIS — M549 Dorsalgia, unspecified: Secondary | ICD-10-CM | POA: Diagnosis not present

## 2021-05-21 DIAGNOSIS — M542 Cervicalgia: Secondary | ICD-10-CM | POA: Diagnosis not present

## 2021-05-21 DIAGNOSIS — E7800 Pure hypercholesterolemia, unspecified: Secondary | ICD-10-CM | POA: Diagnosis present

## 2021-05-21 DIAGNOSIS — N3281 Overactive bladder: Secondary | ICD-10-CM | POA: Diagnosis not present

## 2021-05-21 DIAGNOSIS — J9811 Atelectasis: Secondary | ICD-10-CM | POA: Diagnosis not present

## 2021-05-21 DIAGNOSIS — Z743 Need for continuous supervision: Secondary | ICD-10-CM | POA: Diagnosis not present

## 2021-05-21 LAB — RESP PANEL BY RT-PCR (FLU A&B, COVID) ARPGX2
Influenza A by PCR: NEGATIVE
Influenza B by PCR: NEGATIVE
SARS Coronavirus 2 by RT PCR: NEGATIVE

## 2021-05-21 LAB — CBC WITH DIFFERENTIAL/PLATELET
Abs Immature Granulocytes: 0.04 10*3/uL (ref 0.00–0.07)
Basophils Absolute: 0.1 10*3/uL (ref 0.0–0.1)
Basophils Relative: 2 %
Eosinophils Absolute: 0.1 10*3/uL (ref 0.0–0.5)
Eosinophils Relative: 2 %
HCT: 38.8 % (ref 36.0–46.0)
Hemoglobin: 13.3 g/dL (ref 12.0–15.0)
Immature Granulocytes: 1 %
Lymphocytes Relative: 27 %
Lymphs Abs: 1.8 10*3/uL (ref 0.7–4.0)
MCH: 31.4 pg (ref 26.0–34.0)
MCHC: 34.3 g/dL (ref 30.0–36.0)
MCV: 91.5 fL (ref 80.0–100.0)
Monocytes Absolute: 0.6 10*3/uL (ref 0.1–1.0)
Monocytes Relative: 9 %
Neutro Abs: 3.9 10*3/uL (ref 1.7–7.7)
Neutrophils Relative %: 59 %
Platelets: 254 10*3/uL (ref 150–400)
RBC: 4.24 MIL/uL (ref 3.87–5.11)
RDW: 13.6 % (ref 11.5–15.5)
WBC: 6.5 10*3/uL (ref 4.0–10.5)
nRBC: 0 % (ref 0.0–0.2)

## 2021-05-21 LAB — HEPATIC FUNCTION PANEL
ALT: 18 U/L (ref 0–44)
AST: 19 U/L (ref 15–41)
Albumin: 4.6 g/dL (ref 3.5–5.0)
Alkaline Phosphatase: 92 U/L (ref 38–126)
Bilirubin, Direct: 0.2 mg/dL (ref 0.0–0.2)
Indirect Bilirubin: 0.9 mg/dL (ref 0.3–0.9)
Total Bilirubin: 1.1 mg/dL (ref 0.3–1.2)
Total Protein: 7.8 g/dL (ref 6.5–8.1)

## 2021-05-21 LAB — URINALYSIS, ROUTINE W REFLEX MICROSCOPIC
Bilirubin Urine: NEGATIVE
Glucose, UA: NEGATIVE mg/dL
Hgb urine dipstick: NEGATIVE
Ketones, ur: NEGATIVE mg/dL
Nitrite: NEGATIVE
Protein, ur: NEGATIVE mg/dL
Specific Gravity, Urine: 1.005 (ref 1.005–1.030)
pH: 8 (ref 5.0–8.0)

## 2021-05-21 LAB — BASIC METABOLIC PANEL
Anion gap: 12 (ref 5–15)
BUN: 19 mg/dL (ref 8–23)
CO2: 22 mmol/L (ref 22–32)
Calcium: 10.7 mg/dL — ABNORMAL HIGH (ref 8.9–10.3)
Chloride: 103 mmol/L (ref 98–111)
Creatinine, Ser: 1.04 mg/dL — ABNORMAL HIGH (ref 0.44–1.00)
GFR, Estimated: 54 mL/min — ABNORMAL LOW (ref >60)
Glucose, Bld: 82 mg/dL (ref 70–99)
Potassium: 5.1 mmol/L (ref 3.5–5.1)
Sodium: 137 mmol/L (ref 135–145)

## 2021-05-21 LAB — SALICYLATE LEVEL: Salicylate Lvl: <7 mg/dL — ABNORMAL LOW (ref 7.0–30.0)

## 2021-05-21 LAB — ACETAMINOPHEN LEVEL: Acetaminophen (Tylenol), Serum: 12 ug/mL (ref 10–30)

## 2021-05-21 MED ORDER — DOCUSATE SODIUM 100 MG PO CAPS
100.0000 mg | ORAL_CAPSULE | Freq: Two times a day (BID) | ORAL | Status: DC
  Administered 2021-05-21 – 2021-05-27 (×11): 100 mg via ORAL
  Filled 2021-05-21 (×11): qty 1

## 2021-05-21 MED ORDER — ACYCLOVIR 400 MG PO TABS
400.0000 mg | ORAL_TABLET | Freq: Two times a day (BID) | ORAL | Status: DC
  Administered 2021-05-22 – 2021-05-27 (×11): 400 mg via ORAL
  Filled 2021-05-21 (×13): qty 1

## 2021-05-21 MED ORDER — DONEPEZIL HCL 5 MG PO TABS
5.0000 mg | ORAL_TABLET | Freq: Every day | ORAL | Status: DC
Start: 2021-05-22 — End: 2021-05-28
  Administered 2021-05-22 – 2021-05-26 (×5): 5 mg via ORAL
  Filled 2021-05-21 (×6): qty 1

## 2021-05-21 MED ORDER — MELATONIN 5 MG PO TABS
10.0000 mg | ORAL_TABLET | Freq: Every day | ORAL | Status: DC
  Administered 2021-05-22 – 2021-05-26 (×6): 10 mg via ORAL
  Filled 2021-05-21 (×7): qty 2

## 2021-05-21 MED ORDER — DILTIAZEM HCL ER COATED BEADS 120 MG PO CP24
120.0000 mg | ORAL_CAPSULE | Freq: Every day | ORAL | Status: DC
Start: 2021-05-22 — End: 2021-05-23
  Administered 2021-05-22: 120 mg via ORAL
  Filled 2021-05-21 (×2): qty 1

## 2021-05-21 MED ORDER — ACETAMINOPHEN 325 MG PO TABS
325.0000 mg | ORAL_TABLET | ORAL | Status: DC
Start: 2021-05-21 — End: 2021-05-21
  Administered 2021-05-21: 325 mg via ORAL
  Filled 2021-05-21: qty 1

## 2021-05-21 MED ORDER — ACETAMINOPHEN 325 MG PO TABS
325.0000 mg | ORAL_TABLET | Freq: Four times a day (QID) | ORAL | Status: DC | PRN
  Administered 2021-05-22 – 2021-05-23 (×3): 325 mg via ORAL
  Filled 2021-05-21 (×3): qty 1

## 2021-05-21 MED ORDER — ACETAMINOPHEN 325 MG PO TABS
325.0000 mg | ORAL_TABLET | Freq: Two times a day (BID) | ORAL | Status: DC
  Administered 2021-05-22 (×2): 325 mg via ORAL
  Filled 2021-05-21 (×3): qty 1

## 2021-05-21 MED ORDER — MORPHINE SULFATE (PF) 2 MG/ML IV SOLN: 2.0000 mg | Freq: Once | INTRAVENOUS | Status: DC

## 2021-05-21 MED ORDER — GABAPENTIN 300 MG PO CAPS
300.0000 mg | ORAL_CAPSULE | Freq: Every day | ORAL | Status: DC
Start: 2021-05-21 — End: 2021-05-22
  Administered 2021-05-21: 300 mg via ORAL
  Filled 2021-05-21: qty 1

## 2021-05-21 MED ORDER — ACETAMINOPHEN 325 MG PO TABS
650.0000 mg | ORAL_TABLET | Freq: Once | ORAL | Status: AC
  Administered 2021-05-21: 650 mg via ORAL
  Filled 2021-05-21: qty 2

## 2021-05-21 MED ORDER — MORPHINE SULFATE (PF) 4 MG/ML IV SOLN
4.0000 mg | Freq: Once | INTRAVENOUS | Status: AC
  Administered 2021-05-21: 4 mg via INTRAMUSCULAR
  Filled 2021-05-21: qty 1

## 2021-05-21 NOTE — ED Provider Notes (Signed)
?[facility] EM[facility]GENC[facility] DEPT ?Provider Note ? ? ?CSN: [id number] ?Arrival date & time: 05/21/21  6073 ? ?  ? ?History ? ?Chief Complaint  ?Patient presents with  ? Back Pain  ? Fall  ? ? ?Lynn Price is a 81 y.o. female. ? ?Patient presents chief complaint of lower back pain/buttock pain.  She states has had for several weeks due to falls that she had.  Denies any recent fall but states that the pain has not gone away.  Describes as aching pain in her bilateral buttock region.  No fever no cough no vomiting or diarrhea no new numbness or weakness per patient. ? ? ?  ? ?Home Medications ?Prior to Admission medications   ?Medication Sig Start Date End Date Taking? Authorizing Provider  ?acetaminophen (TYLENOL) 325 MG tablet Take 325 mg by mouth See admin instructions. 325 mg twice a day and can take every 6 hours as needed for pain/headache    [provider]  ?acyclovir (ZOVIRAX) 400 MG tablet Take 400 mg by mouth 2 (two) times daily.     [provider]  ?diltiazem (CARDIZEM CD) 120 MG 24 hr capsule Take 120 mg by mouth daily.    [provider]  ?docusate sodium (COLACE) 100 MG capsule Take 1 capsule (100 mg total) by mouth every 12 (twelve) hours. 04/04/21   Mesner, Barbara Cower, MD  ?donepezil (ARICEPT) 5 MG tablet TAKE 1 TABLET BY MOUTH AT  BEDTIME ?Patient taking differently: Take 5 mg by mouth at bedtime. 09/24/20   Glean Salvo, NP  ?gabapentin (NEURONTIN) 300 MG capsule Take 1 capsule by mouth at bedtime only. ?Patient taking differently: Take 300 mg by mouth at bedtime. 03/07/19   Pollyann Savoy, MD  ?LORazepam (ATIVAN) 1 MG tablet Take 1 mg by mouth 2 (two) times daily as needed for anxiety.    [provider]  ?Omega-3 Fatty Acids (FISH OIL) 1200 MG CAPS Take 1,200 mg by mouth daily.     [provider]  ?ondansetron (ZOFRAN-ODT) 4 MG disintegrating tablet 4mg  ODT q4 hours prn nausea/vomit 04/04/21   Mesner, 06/02/21, MD  ?polyethylene glycol (MIRALAX /  GLYCOLAX) 17 g packet Take 17 g by mouth daily. 04/04/21   Mesner, 06/02/21, MD  ?sodium phosphate (FLEET) 7-19 GM/118ML ENEM Place 133 mLs (1 enema total) rectally daily as needed for up to 5 doses for severe constipation. 04/04/21   Mesner, 06/02/21, MD  ?   ? ?Allergies    ?Erythromycin, Penicillins, Sulfa antibiotics, and Tetracyclines & related   ? ?Review of Systems   ?Review of Systems  ?Constitutional:  Negative for fever.  ?HENT:  Negative for ear pain.   ?Eyes:  Negative for pain.  ?Respiratory:  Negative for cough.   ?Cardiovascular:  Negative for chest pain.  ?Gastrointestinal:  Negative for abdominal pain.  ?Genitourinary:  Negative for flank pain.  ?Musculoskeletal:  Negative for back pain.  ?Skin:  Negative for rash.  ?Neurological:  Negative for headaches.  ? ?Physical Exam ?Updated Vital Signs ?BP (!) 110/96   Pulse 77   Temp 98.3 ?F (36.8 ?C) (Oral)   Resp 18   Ht 5\' 7"  (1.702 m)   Wt 51.7 kg   SpO2 100%   BMI 17.84 kg/m?  ?Physical Exam ?Constitutional:   ?   General: She is not in acute distress. ?   Appearance: Normal appearance.  ?HENT:  ?   Head: Normocephalic.  ?   Nose: Nose normal.  ?Eyes:  ?  Extraocular Movements: Extraocular movements intact.  ?Cardiovascular:  ?   Rate and Rhythm: Normal rate.  ?Pulmonary:  ?   Effort: Pulmonary effort is normal.  ?Musculoskeletal:     ?   General: Normal range of motion.  ?   Cervical back: Normal range of motion.  ?   Comments: No sacral decubitus ulcer noted.  No abrasion or lesion noted on the lower back/buttock region.  No significant tenderness noted on palpation.  No C or T or L-spine midline step-offs noted.  ?Neurological:  ?   General: No focal deficit present.  ?   Mental Status: She is alert. Mental status is at baseline.  ? ? ?ED Results / Procedures / Treatments   ?Labs ?(all labs ordered are listed, but only abnormal results are displayed) ?Labs Reviewed  ?BASIC METABOLIC PANEL - Abnormal; Notable for the following components:  ?    Result  Value  ? Creatinine, Ser 1.04 (*)   ? Calcium 10.7 (*)   ? GFR, Estimated 54 (*)   ? All other components within normal limits  ?URINALYSIS, ROUTINE W REFLEX MICROSCOPIC - Abnormal; Notable for the following components:  ? Color, Urine COLORLESS (*)   ? Leukocytes,Ua TRACE (*)   ? All other components within normal limits  ?SALICYLATE LEVEL - Abnormal; Notable for the following components:  ? Salicylate Lvl <7.0 (*)   ? All other components within normal limits  ?RESP PANEL BY RT-PCR (FLU A&B, COVID) ARPGX2  ?CBC WITH DIFFERENTIAL/PLATELET  ?HEPATIC FUNCTION PANEL  ?ACETAMINOPHEN LEVEL  ? ? ?EKG ?None ? ?Radiology ?DG Pelvis 1-2 Views ? ?Result Date: 05/21/2021 ?CLINICAL DATA:  pain; bilateral buttock pain EXAM: PELVIS - 1-2 VIEW COMPARISON:  CT April 2021 FINDINGS: Normal alignment. No acute fracture. The sacrum is somewhat obscured by overlying bowel gas. Intra-abdominal and pelvic soft tissue calcifications similar to previous CT. There is calcifications of the tendon insertions along the inferior pubic rami bilaterally. IMPRESSION: 1. No malalignment or acute fracture. 2. There is calcific tendinopathy of the hamstring muscle insertions along the inferior pubic rami bilaterally. Electronically Signed   By: Olive BassYasser  El-Abd M.D.   On: 05/21/2021 10:45  ? ?DG Chest Port 1 View ? ?Result Date: 05/21/2021 ?CLINICAL DATA:  Fall EXAM: PORTABLE CHEST 1 VIEW COMPARISON:  07/18/2019 FINDINGS: The heart size and mediastinal contours are within normal limits. Atherosclerotic calcification of the aortic knob. Hyperinflated lungs. No focal airspace consolidation, pleural effusion, or pneumothorax. The visualized skeletal structures are unremarkable. IMPRESSION: No active disease. Electronically Signed   By: Duanne GuessNicholas  Plundo D.O.   On: 05/21/2021 10:29   ? ?Procedures ?Procedures  ? ? ?Medications Ordered in ED ?Medications  ?acetaminophen (TYLENOL) tablet 650 mg (650 mg Oral Given 05/21/21 1003)  ?morphine (PF) 4 MG/ML injection  4 mg (4 mg Intramuscular Given 05/21/21 1124)  ? ? ?ED Course/ Medical Decision Making/ A&P ?  ?                        ?Medical Decision Making ?Amount and/or Complexity of Data Reviewed ?Labs: ordered. ?Radiology: ordered. ? ?Risk ?OTC drugs. ?Prescription drug management. ? ? ?Review of chart medical records show prior visits for psychiatric illness. ? ?Work-up is unremarkable no evidence of urinary tract infection white count chemistry was normal.  However the patient spoke with the nurse and stated that she wants to end her life.  She feels depressed due to continue medical problems and continued pain.  Denies  any specific plan. ? ?Patient placed on a psychiatric hold and CTS consultation requested. ? ? ? ? ? ? ? ? ? ?Final Clinical Impression(s) / ED Diagnoses ?Final diagnoses:  ?Buttock pain  ?Suicidal thoughts  ? ? ?Rx / DC Orders ?ED Discharge Orders   ? ? None  ? ?  ? ? ?  ?Cheryll Cockayne, MD ?05/21/21 1427 ? ?

## 2021-05-21 NOTE — ED Notes (Addendum)
Pt ambulatory with [facility]er at [facility] when [facility] arrived. Pt alert and oriented x 4. Pt follows commands and conversation well. Pt was aware of all medications listed as allergies. Pt complains of  bilateral pain to buttocks and pain to lower back. ? ?Pt thinks her last fall was in first week of March.  ?

## 2021-05-21 NOTE — ED Notes (Signed)
Pt provided water, ginger ale, chicken noodle soup and crackers, per request. MD China agreed. ?

## 2021-05-21 NOTE — ED Notes (Signed)
Pt provided graham crackers and ginger ale  

## 2021-05-21 NOTE — ED Notes (Signed)
RN and NT here to place Lynn Price on the bedpan.  She is in the bed and is a little less agitated at this time with some pain relief.   ?

## 2021-05-21 NOTE — ED Notes (Signed)
Pt provided with chicken noodle soup, crackers, and ginger ale per request.  ?

## 2021-05-21 NOTE — BH Assessment (Signed)
Comprehensive Clinical Assessment (CCA) Note ? ?05/22/2021 ?Milford Cage Miyasato ?[id number] ? ?Discharge Disposition: ?Dennie Maizes, NP, reviewed pt's chart and information and determined pt meets geri-psych hospitalization. Pt's referral information will be faxed out to multiple hospitals for potential placement. This information was relayed to pt's team at 2240. ? ?The patient demonstrates the following risk factors for suicide: Chronic risk factors for suicide include: psychiatric disorder of MDD, Recurrent, Moderate, chronic pain, and completed suicide in a family member. Acute risk factors for suicide include: family or marital conflict, social withdrawal/isolation, and loss (financial, interpersonal, professional). Protective factors for this patient include: positive social support and coping skills. Considering these factors, the overall suicide risk at this point appears to be low. Patient is not appropriate for outpatient follow up. ? ?Therefore, a tele-sitter is recommended for suicide precautions. ? ?[place] ED from 05/21/2021 in [facility] DEPT ED from 04/05/2021 in [facility] DEPT ED from 04/03/2021 in [place]  ?C-SSRS RISK CATEGORY Low Risk High Risk No Risk  ? ?  ?Chief Complaint:  ?Chief Complaint  ?Patient presents with  ? Back Pain  ? Fall  ? Suicidal  ? ?Visit Diagnosis: MDD, Recurrent, Moderate ? ?CCA Screening, Triage and Referral (STR) ?Courtnee Rambeau is an 81 year old patient who was brought to the [facility] ED due to complaints of pain; she was referred for a [facility] Assessment after making comments that hospital staff should allow her to die. When asked why she was brought to the hospital, pt states, "I wasn't feeling good. I was hurting in my buttocks. Also my upper back was hurting. I had tingling - I guess it's neuropathy. It's bothering me real bad. Around noon I was hurting in my chest. I was  taking Lorazepam and I've been on it for years and they changed it to Ativan - they changed it in the last 2-3 weeks." Pt shares she does not believe the Ativan is working as well as the Lorazepam was. When asked how she likes living in her assisted living community she states, "It's not like living at home."  ? ?When pt was asked if she's been experiencing SI she denies this, stating that she was told she has been but that she doesn't remember. Pt states, "I don't really understand why I would try to do that." Pt denies SI, stating, "not that I can remember. I don't want to remember - I've never wanted to do that." She denies she's ever attempted to kill herself. She denies HI, AVH, and NSSIB. ? ?Pt provided verbal consent for clinician to make contact with her daughter-in-law, Yantis. Clinician made contact with Christi at 2140; she stated, "This all came on the eve of [place] trying to get her to move to the assisted living. She lost her first son when he was 40. She lost her husband about 1 1/2 years ago, and she shows signs of dementia. She's fallen several times. She's been there probably about 2 months, a month. It was right in January when we moved her. In the process of moving her, I was coming in to move her on Saturday and on Friday she fell. She fell face-first and she was in the ER. She came home but then Saturday night she was vomiting so we went back to the ER; she was extremely dehydrated. I wonder how much she's actually remembering. Just recently she says she's feeling very, very anxious. All of her sisters have had mild  depression, one did commit suicide. I think she's depressed because most of her family has moved away. Now that her husband's gone and I live in [place]. She worries constantly over paperwork.  ? ?"About 4 years ago she kept taking about the man who kept walking on her back deck at night. She says, 'people are copming into the apartment and moving my papers around and circle things  on my to-do list.' She doesn't remember things quite well. We did have her come over at Christmas and we were playing '[place]' and she could remember things from the 40's. I think she's extrememly lonely - she spent her whole life raising those boys. I think she's terrified that her only living son is going to die before her. ? ?"She mentioned that she had taken Lorazepam and they changed her to Ativan. She said she had had Lorazepam for a long time. She's very paranoid; she'll put something away and then accuse someone of taking it from her or hiding it from her. She'll forgot who people are, but she's never not known me or [my son] Ben. She's very paranoid. She sees children running around in her apartment. When she first got there she was very depressed; she would sit on her bed, holding her will, and saying, 'I don't want to live like this.' I know her sister committed suicide and I know she had a hard time with that doing therapy for years [afterwards]. ? ?Pt is oriented x5; she was able to identify her name, that it is currently March 2023, that Barbette Or is the president, and that she lives in an assisted living facility (she had the street number wrong but had the street and city correct). Pt's memory is, overall, intact, with some memory difficulties regarding recent things, such as the hallucinations she's been experiencing or making statements about no longer wanting to be alive. Pt's insight, judgement, and impulse control is impaired at this time. ? ?Patient Reported Information ?How did you hear about [place]? Other (Comment) ? ?What Is the Reason for Your Visit/Call Today? Pt states, "I wasn't feeling good. I was hurting in my buttocks. Also my upper back was hurting. I had tingling - I guess it's neuropathy. It's bothering me real bad. Around noon I was hurting in my chest. I was taking Lorazepam and I've been on it for years and they changed it to Ativan - they changed it in the last 2-3 weeks." Pt  shares she does not believe the Ativan is working as well as the Lorazepam was. When asked how she likes living in her assisted living community she states, "It's not like living at home." When asked if she's been experiencing SI she denies this, stating that she was told she has been but that she doesn't remember. Pt states, "I don't really understand why I would try to do that." Pt denies SI, stating, "not that I can remember. I don't want to remember - I've never wanted to do that." She denies she's ever attempted to kill herself. She denies HI, AVH, and NSSIB. ? ?How Long Has This Been Causing You Problems? 1-6 months ? ?What Do You Feel Would Help You the Most Today? Treatment for Depression or other mood problem ? ? ?Have You Recently Had Any Thoughts About Hurting Yourself? -- (Pt denies, though pt's daughter-in-law reports she was told pt has been making suicidal statements) ? ?Are You Planning to Commit Suicide/Harm Yourself At This time? -- (Pt denies, though  pt's daughter-in-law reports she was told pt has been making suicidal statements) ? ? ?Have you Recently Had Thoughts About [place]? No ? ?Are You Planning to Harm Someone at This Time? No ? ?Explanation: No data recorded ? ?Have You Used Any Alcohol or Drugs in the Past 24 Hours? No ? ?How Long Ago Did You Use Drugs or Alcohol? No data recorded ?What Did You Use and How Much? No data recorded ? ?Do You Currently Have a Therapist/Psychiatrist? No ? ?Name of Therapist/Psychiatrist: No data recorded ? ?Have You Been Recently Discharged From Any Office Practice or Programs? No ? ?Explanation of Discharge From Practice/Program: No data recorded ? ?  ?CCA Screening Triage Referral Assessment ?Type of Contact: Tele-Assessment ? ?Telemedicine Service Delivery: Telemedicine service delivery: This service was provided via telemedicine using a 2-way, interactive audio and video technology ? ?Is this Initial or Reassessment? Initial  Assessment ? ?Date Telepsych consult ordered in [facility]:  05/21/21 ? ?Time Telepsych consult ordered in [facility]:  1136 ? ?Location of Assessment: [facility] ED ? ?Provider Location: [facility] Assessment Services ? ? ?Collateral Involvement

## 2021-05-21 NOTE — ED Notes (Signed)
Sitter advised that Pt woke up complaining of chest pain. NT was advised to obtain EKG. When This RN assessed Pt. She pointed to her mid sternal epigastric area and stated that she thought she was having pain because she has not had anything to  eat. EKG given to MD and advised of same. Vitals baseline.  ? ?Pt appears to have difficulty interpreting body functions appropriately.  ? ? ?

## 2021-05-21 NOTE — ED Triage Notes (Signed)
Patient reports to the ER from [facility]. Patient reportedly has had multiple falls and is reporting back pain. Patient is alert only to self at baseline. Patient has not reportedly had a fall today but reports left lower and upper back pain.  ?

## 2021-05-21 NOTE — ED Notes (Signed)
RN and MD made aware of patient statements of wanting to die and of her pain level.  She is in the bed and extra warm blankets were given to her. ?

## 2021-05-21 NOTE — ED Notes (Signed)
Lynn Price continues to make statements about wanting to die.  She says that she has tried to take her life before, but does not remember how.  She is complaining of back and pelvic pain.  We are waiting on results and MD evaluation.   ?

## 2021-05-21 NOTE — ED Notes (Signed)
Pt provided macaroni and cheese, per request. ?

## 2021-05-21 NOTE — ED Notes (Signed)
RT sitting with patient for safety concerns. Pt. Trying to climb out of bed.  She started stating that she wants to die and that she does not want [place] to do anything to prolong her life.  She does not want to return to the facility where she lives because she is "not happy and they are short staffed".  She has a son that has died and a son in [place].  She says that she is no help to them and that there is no reason for her to live.  She is begging God to take her and for me to give her something to facilitate her death.  She states, "My family will understand, they know I don't want to live like this."  ?

## 2021-05-21 NOTE — ED Notes (Signed)
TTS is not sure when they will be able to evaluate patient ?

## 2021-05-21 NOTE — ED Notes (Signed)
Pt stated that her grandson and his wife are having a baby in October. The Pt stated that she wanted to live to see her great grand baby. Pt talked positive about her family and how she grew up in [place] ?

## 2021-05-21 NOTE — ED Notes (Signed)
Pt woke up complaining of chest pain, states that it may be because she has not eaten anything. RN Joe notified and EKG performed.  ?

## 2021-05-21 NOTE — ED Notes (Addendum)
Pt stated that she is no longer having the chest pain after eating the soup provided. Pt stated "I think I was just hungry". ?

## 2021-05-21 NOTE — ED Notes (Signed)
Pt is alert to self, time, place and location. However, pt does repeat questions such as are you my doctor or does not remmber seeing doctor.  ?

## 2021-05-22 DIAGNOSIS — F322 Major depressive disorder, single episode, severe without psychotic features: Secondary | ICD-10-CM | POA: Diagnosis present

## 2021-05-22 MED ORDER — GABAPENTIN 100 MG PO CAPS
100.0000 mg | ORAL_CAPSULE | Freq: Two times a day (BID) | ORAL | Status: DC
  Administered 2021-05-22 – 2021-05-27 (×11): 100 mg via ORAL
  Filled 2021-05-22 (×10): qty 1

## 2021-05-22 MED ORDER — ONDANSETRON 4 MG PO TBDP
4.0000 mg | ORAL_TABLET | Freq: Once | ORAL | Status: AC
  Administered 2021-05-22: 4 mg via ORAL
  Filled 2021-05-22: qty 1

## 2021-05-22 MED ORDER — DULOXETINE HCL 20 MG PO CPEP
20.0000 mg | ORAL_CAPSULE | Freq: Every day | ORAL | Status: DC
  Administered 2021-05-22 – 2021-05-24 (×3): 20 mg via ORAL
  Filled 2021-05-22 (×3): qty 1

## 2021-05-22 MED ORDER — GABAPENTIN 100 MG PO CAPS
100.0000 mg | ORAL_CAPSULE | Freq: Two times a day (BID) | ORAL | Status: DC
  Filled 2021-05-22: qty 1

## 2021-05-22 MED ORDER — LORAZEPAM 0.5 MG PO TABS
0.2500 mg | ORAL_TABLET | Freq: Two times a day (BID) | ORAL | Status: DC | PRN
  Administered 2021-05-22 – 2021-05-24 (×4): 0.25 mg via ORAL
  Filled 2021-05-22 (×4): qty 1

## 2021-05-22 NOTE — Progress Notes (Signed)
Inpatient Behavioral Health Rolena Infante ? ?Pt meets inpatient criteria per Bobbitt, NP. There are no appropriate beds at [facility] or [facility].  Referral was sent to the following facilities;  ? ?Destination ?Service Provider Address Phone Fax  ?[facility]  [address], [place] [address] [phone]  ?[facility]  [address] [place] [place] [address] [phone]  ?[facility]  [address] [place] [address] [phone]  ?[facility]  [address] [place] [address] [phone]  ?[facility]  [address] [place] [address] [phone]  ?[facility]  [address], [place] [address] [phone]  ?[facility]  [address], [place] [address] [phone]  ?[facility]  [address] [place] [address] [phone]  ?[facility]  [address] [place] [place] [address] [phone]  ?[facility]  [address], [place] [address] [phone]  ?[facility]  [address] [place] [address] [phone]  ? ? ? ?Situation ongoing,  CSW will follow up. ? ? ?Maryjean Ka, MSW, LCSWA ?05/22/2021  @ 1:05 AM ? ?

## 2021-05-22 NOTE — Progress Notes (Signed)
CSW faxed referral to [facility] for further review. ? ?Crissie Reese, MSW, LCSW-A, LCAS-A ?Phone: [phone] ?Disposition/TOC ? ?

## 2021-05-22 NOTE — ED Notes (Signed)
Per sitter pt was washing her hand after she went to the bathroom. Pt fell backwards toward the sitter. RN help the sitter to carry patient in the bed. VS checked. EDP informed and ordered zofran and EKG. Will continue to monitor. ?

## 2021-05-22 NOTE — ED Provider Notes (Signed)
?  Physical Exam  ?BP 123/78 (BP Location: Right Arm)   Pulse 70   Temp (!) 97.4 ?F (36.3 ?C) (Oral)   Resp 16   Ht 5\' 7"  (1.702 m)   Wt 51.7 kg   SpO2 99%   BMI 17.84 kg/m?  ? ?Physical Exam ? ?Procedures  ?Procedures ? ?ED Course / MDM  ?  ?Medical Decision Making ?Amount and/or Complexity of Data Reviewed ?Labs: ordered. ?Radiology: ordered. ? ?Risk ?OTC drugs. ?Prescription drug management. ? ?Received care of patient at 7 AM.  Please see previous notes for history, physical and care. ?81 year old female who presented with pelvic and back pain as well as suicidal ideation. ?She is currently awaiting placement for inpatient care for her depression and suicidal ideation. ?She denies any acute concerns this morning.  Was able to sleep okay. ? ? ? ? ?  ?Gareth Morgan, MD ?05/22/21 [phone] ? ?

## 2021-05-22 NOTE — Progress Notes (Signed)
Per Junious Dresser, patient meets criteria for inpatient treatment. There are no available or appropriate beds at [facility] today. CSW faxed referrals to the following facilities for review: ? ?[facility]  Pending - Request Sent N/A [address] [place] [address] [phone] --  ?[facility]  Pending - Request Sent N/A [address] [place] [address] [phone] --  ?[facility]  Pending - Request Sent N/A [address] [place] [address] [phone] --  ?[facility]  Pending - Request Sent N/A [address], [place] [address] [phone] --  ?[facility]  Pending - Request Sent N/A [address] [place] [address] [phone] --  ?[facility]  Pending - Request Sent N/A [address] [place] [address] [phone] --  ?[facility]  Pending - Request Sent N/A [address] [place] [address] [phone] --  ?[facility]  Pending - Request Sent N/A [address] [place] [address] [phone] --  ?[facility]  Pending - Request Sent N/A [address] [place] [address] [place] [address] [phone] --  ?[facility]  Pending - Request Sent N/A [address] [place], [place] [address] [phone] --  ?[facility]  Pending - Request Sent N/A [address] [place] [address] [phone] --  ?[facility]  Pending - Request Sent N/A [address] [place], [place] [address] [phone] --  ? ?TTS will continue to seek bed placement. ? ?Crissie Reese, MSW, LCSW-A, LCAS-A ?Phone: [phone] ?Disposition/TOC ? ?

## 2021-05-22 NOTE — ED Notes (Signed)
Pt bed alarm on, bilateral fall risk arm bands applied, pt wearing yellow no slip socks, and two fall risk magnets placed on door frame. ?

## 2021-05-22 NOTE — ED Notes (Addendum)
Lynn Price called. She said, "I have been her caregiver for 14 years, I've never brought a walker or a brush or a toothbrush to the hospital. If I bring her walker, she's going to get up and fall. She's up all day long at her assisted living. She drives the nurses' crazy going to their nurses' station. She goes back and forth, up and down the halls with that walker. She has fallen at least 5 times at assisted living, that I know of, and she has been there only 1 month. She is very demanding and wants what she wants when she wants it. It's her dementia. She drives her son crazy calling him all the time. She has very bad anxiety and dementia. Her dementia is getting worse. She takes lorazepam for anxiety when she asks for it at assisted living. Lynn Price at [facility] is the main nurse. She knows how much she takes.? ?

## 2021-05-22 NOTE — ED Notes (Addendum)
Pt said she needs her walker, brush, and several things from home. She asked that I call Gavin Pound (her caretaker), so she can bring these items to the hospital. Pt does not know Deborah's number, so she requested to call her daughter-in-law, Aline Brochure [phone]. Pt authorized me to discuss her medical and psychiatric care with Gavin Pound and Beltrami. [facility] and she said, ?Depression runs in her family. I don't know about her mental state right now. She's had a bad 2 to 3 years. My husband, her son, passed away at 89. Her husband passed away last year. Her other living son in [place] has cirrhosis of the liver, he had a transplant, but it didn't work. Now, he needs a liver and kidney transplant. I know it weighs heavy on her mind that she doesn't want to lose him. I think she wants to die before he does.?  ? ??I know she is very depressed. As far as killing herself, I don't think she would. She had a sister who killed herself. Shot herself in the bathtub. She went through years of counseling after that. She is so close to her family. I don't think she would kill herself, but I don't think she has the will to live. She's lonely. We come to visit her as much as we can but it's hard. It's devastating. She's my second mom. I don't know how to help her. I know she needs psychiatric help. She needs somebody to talk to. She's worried to death she's going to lose her baby boy. She told me I can't lose him first. If you could get her some help, I would be so appreciative.? ?Christi said pt has dementia and experiences sundowning. Pt is prone to dehydration. Four years ago, pt routinely saw a man pacing back and forth on her deck. She also covered the vents on the floor in her bathroom because he was under the house looking through the vents. No one was on her deck or under her house. A month ago, pt thought children were in her apartment and the halls. There were no children in apt. Christi requested to speak to pt, so  we took pt a phone to speak with her. ?

## 2021-05-22 NOTE — ED Notes (Signed)
Cristal Ford at [phone] and left a HIPPA compliant voicemail.  ?

## 2021-05-22 NOTE — Progress Notes (Signed)
CSW spoke Stacy/Phone#: [phone] with the [facility] for [facility]. Additional information was requested for further review. CSW faxed BH assessment to the [facility]. ? ?Crissie Reese, MSW, LCSW-A, LCAS-A ?Phone: [phone] ?Disposition/TOC ?  ?

## 2021-05-22 NOTE — Progress Notes (Signed)
Heather with [facility] advised that the patient has been declined. ? ?Lynn Price, MSW, LCSW-A, LCAS-A ?Phone: [phone] ?Disposition/TOC ? ?

## 2021-05-22 NOTE — ED Provider Notes (Signed)
Patient had lightheadedness after getting up after using the bathroom.  She did not pass out.  She got nauseous and sat down.  Overall she is asymptomatic now.  EKG done shows sinus rhythm.  No ischemic changes.  Vital signs are normal.  Given Zofran.  Overall suspect vasovagal event.  Lab work yesterday unremarkable.  Normal neurological exam.  No concern for stroke or other emergent process. ? ?This chart was dictated using voice recognition software.  Despite best efforts to proofread,  errors can occur which can change the documentation meaning.  ?  Lennice Sites, DO ?05/22/21 2127 ? ?

## 2021-05-22 NOTE — ED Notes (Signed)
Pt cooperative from 7:11 AM to present. Pt tearful and express bilateral foot pain throughout shift. Pt said she ?does not want to go on living like this.? Pt 1 assist to restroom.  ?

## 2021-05-22 NOTE — ED Notes (Addendum)
Pt. Agitated on arrival to ED stating that she did not want to be here. She was assisted to the bathroom and helped into the bed. Cooperative with meds. She was able down and sleep the rest of the night. ?

## 2021-05-22 NOTE — Consult Note (Addendum)
[facility] Psych ED Progress Note ? ?05/22/2021 11:38 AM ?Lynn Price  ?MRN:  [id number] ? ? ?Method of visit?: Face to Face  ? ?Subjective:  "  I guess I am depressed, anxious, sad" ?81 years old female seen this morning waiting for hospitalization in a Gero/Psych unit for treatment of depression and anxiety.  Patient spoke in a soft slow tone this morning.  She reported feeling depressed, sad and hopeless.  She reported her trigger to be the loss of husband, inability to do things she used to do for herself and depending on other people to take care of her.  Patient is a resident of [facility] facility in [place].  She used to live in the same with her husband until he passed on and then she was moved to a different section in the community.  Patient feels anxious mainly due to her declining health condition.  She reported that she felt one time that life is not worth living but added she does not want to die because she is expecting a grandson in October.  Patient vent on to state that she want to live and se, touch and feel her new grandson when the time comes.  She reported broken 3-5 hours sleep at night.  She reported fairly good appetite.  She admitted she is still grieving her husband's passing and agreed today to engage in counseling.  She denied SI/HI/AVH and no mention of Paranoia.  Cymbalta 20 mg started for pain and depression. ? ?Provider called and spoke to her daughter in [place] who reported that patient has been depressed but no antidepressant she is aware she is taking.  She agreed to the idea of starting low dose Cymbalta.  We will also change her Gabapentin to twice a day dosing to ease her Neuropathic pain. ? ?Principal Problem: Depression, major, single episode, severe (HCC) ?Diagnosis:  Principal Problem: ?  Depression, major, single episode, severe (HCC) ? ?Total Time spent with patient: 30 minutes ? ?Past Psychiatric History: Unremarkable except for hx of  Anxiety and Memory  loss  but patient is alert and oriented 5.  She denied hx of inpatient hospitalization for mental illness but have been taking Ativan for years. ? ?Past Medical History:  ?Past Medical History:  ?Diagnosis Date  ? Anxiety   ? Fibromyalgia   ? GERD (gastroesophageal reflux disease)   ? High blood pressure   ? HSV (herpes simplex virus) anogenital infection   ? IBS (irritable bowel syndrome)   ? Memory loss   ? Neuropathy   ? Obesity (BMI 30-39.9) 04/01/2014  ? Osteoarthritis   ? Overactive bladder   ? Restless leg syndrome   ? Sleep apnea   ?  ?Past Surgical History:  ?Procedure Laterality Date  ? APPENDECTOMY    ? CATARACT EXTRACTION Left   ? CESAREAN SECTION    ? CHOLECYSTECTOMY    ? colonscopy    ? MULTIPLE TOOTH EXTRACTIONS    ? ROTATOR CUFF REPAIR Left   ? TOTAL KNEE ARTHROPLASTY    ? ?Family History:  ?Family History  ?Problem Relation Age of Onset  ? Depression Mother   ? Depression Father   ? Mental illness Sister   ? Mental illness Sister   ? ?Family Psychiatric  History: Per Nursing document, patient's sister committed suicide by shooting self in the bath tub. ?Social History:  ?Social History  ? ?Substance and Sexual Activity  ?Alcohol Use Not Currently  ? Comment: wine occas  ?   ?  Social History  ? ?Substance and Sexual Activity  ?Drug Use No  ?  ?Social History  ? ?Socioeconomic History  ? Marital status: Widowed  ?  Spouse name: Dimas Aguas  ? Number of children: 2  ? Years of education: college  ? Highest education level: Associate degree: academic program  ?Occupational History  ?  Comment: retired  ?Tobacco Use  ? Smoking status: Never  ? Smokeless tobacco: Never  ?Vaping Use  ? Vaping Use: Never used  ?Substance and Sexual Activity  ? Alcohol use: Not Currently  ?  Comment: wine occas  ? Drug use: No  ? Sexual activity: Not on file  ?Other Topics Concern  ? Not on file  ?Social History Narrative  ? Patient passed away November 20, 2019, and she is retired. Patient has 2 years of college. Patient has 2 children.  ?  12/09/20 lives at [facility], independent living    ? ?Social Determinants of Health  ? ?Financial Resource Strain: Not on file  ?Food Insecurity: Not on file  ?Transportation Needs: Not on file  ?Physical Activity: Not on file  ?Stress: Not on file  ?Social Connections: Not on file  ? ? ?Sleep: Fair ? ?Appetite:  Fair ? ?Current Medications: ?Current Facility-Administered Medications  ?Medication Dose Route Frequency Provider Last Rate Last Admin  ? acetaminophen (TYLENOL) tablet 325 mg  325 mg Oral BID Benjiman Core, MD   325 mg at 05/22/21 0932  ? acetaminophen (TYLENOL) tablet 325 mg  325 mg Oral Q6H PRN Benjiman Core, MD   325 mg at 05/22/21 0443  ? acyclovir (ZOVIRAX) tablet 400 mg  400 mg Oral BID Benjiman Core, MD   400 mg at 05/22/21 1126  ? diltiazem (CARDIZEM CD) 24 hr capsule 120 mg  120 mg Oral Daily Benjiman Core, MD   120 mg at 05/22/21 0347  ? docusate sodium (COLACE) capsule 100 mg  100 mg Oral Q12H Benjiman Core, MD   100 mg at 05/22/21 4259  ? donepezil (ARICEPT) tablet 5 mg  5 mg Oral QHS Benjiman Core, MD      ? DULoxetine (CYMBALTA) DR capsule 20 mg  20 mg Oral Daily Markeis Allman C, NP      ? gabapentin (NEURONTIN) capsule 300 mg  300 mg Oral Renee Harder, MD   300 mg at 05/21/21 2238  ? melatonin tablet 10 mg  10 mg Oral Renee Harder, MD   10 mg at 05/22/21 0040  ? ?Current Outpatient Medications  ?Medication Sig Dispense Refill  ? acetaminophen (TYLENOL) 325 MG tablet Take 325 mg by mouth See admin instructions. 325 mg twice a day and can take every 6 hours as needed for pain/headache    ? acyclovir (ZOVIRAX) 400 MG tablet Take 400 mg by mouth 2 (two) times daily.     ? diltiazem (CARDIZEM CD) 120 MG 24 hr capsule Take 120 mg by mouth daily.    ? docusate sodium (COLACE) 100 MG capsule Take 1 capsule (100 mg total) by mouth every 12 (twelve) hours. 60 capsule 0  ? donepezil (ARICEPT) 5 MG tablet TAKE 1 TABLET BY MOUTH AT  BEDTIME (Patient taking  differently: Take 5 mg by mouth at bedtime.) 90 tablet 3  ? gabapentin (NEURONTIN) 300 MG capsule Take 1 capsule by mouth at bedtime only. (Patient taking differently: Take 300 mg by mouth at bedtime.) 30 capsule 0  ? LORazepam (ATIVAN) 1 MG tablet Take 0.5 mg by mouth 2 (two) times daily as needed  for anxiety. Take 1/2 tablet = 0.25mg  by mouth 2 times daily as needed for anxiety    ? Melatonin 10 MG CAPS Take 10 mg by mouth at bedtime.    ? Omega-3 Fatty Acids (FISH OIL) 1200 MG CAPS Take 1,200 mg by mouth daily.     ? polyethylene glycol (MIRALAX / GLYCOLAX) 17 g packet Take 17 g by mouth daily. 14 each 0  ? ondansetron (ZOFRAN-ODT) 4 MG disintegrating tablet 4mg  ODT q4 hours prn nausea/vomit 30 tablet 0  ? sodium phosphate (FLEET) 7-19 GM/118ML ENEM Place 133 mLs (1 enema total) rectally daily as needed for up to 5 doses for severe constipation. 665 mL 0  ? ? ?Lab Results:  ?Results for orders placed or performed during the hospital encounter of 05/21/21 (from the past 48 hour(s))  ?CBC with Differential     Status: None  ? Collection Time: 05/21/21 10:00 AM  ?Result Value Ref Range  ? WBC 6.5 4.0 - 10.5 K/uL  ? RBC 4.24 3.87 - 5.11 MIL/uL  ? Hemoglobin 13.3 12.0 - 15.0 g/dL  ? HCT 38.8 36.0 - 46.0 %  ? MCV 91.5 80.0 - 100.0 fL  ? MCH 31.4 26.0 - 34.0 pg  ? MCHC 34.3 30.0 - 36.0 g/dL  ? RDW 13.6 11.5 - 15.5 %  ? Platelets 254 150 - 400 K/uL  ? nRBC 0.0 0.0 - 0.2 %  ? Neutrophils Relative % 59 %  ? Neutro Abs 3.9 1.7 - 7.7 K/uL  ? Lymphocytes Relative 27 %  ? Lymphs Abs 1.8 0.7 - 4.0 K/uL  ? Monocytes Relative 9 %  ? Monocytes Absolute 0.6 0.1 - 1.0 K/uL  ? Eosinophils Relative 2 %  ? Eosinophils Absolute 0.1 0.0 - 0.5 K/uL  ? Basophils Relative 2 %  ? Basophils Absolute 0.1 0.0 - 0.1 K/uL  ? Immature Granulocytes 1 %  ? Abs Immature Granulocytes 0.04 0.00 - 0.07 K/uL  ?  Comment: Performed at [facility], [address], [place], [place] [address]  ?Basic metabolic panel     Status: Abnormal   ? Collection Time: 05/21/21 10:00 AM  ?Result Value Ref Range  ? Sodium 137 135 - 145 mmol/L  ? Potassium 5.1 3.5 - 5.1 mmol/L  ? Chloride 103 98 - 111 mmol/L  ? CO2 22 22 - 32 mmol/L  ? Glucose, Bld

## 2021-05-23 ENCOUNTER — Encounter (HOSPITAL_COMMUNITY): Payer: Medicare Other

## 2021-05-23 ENCOUNTER — Emergency Department (HOSPITAL_COMMUNITY): Payer: Medicare Other

## 2021-05-23 ENCOUNTER — Observation Stay (HOSPITAL_COMMUNITY): Payer: Medicare Other

## 2021-05-23 ENCOUNTER — Encounter (HOSPITAL_COMMUNITY): Payer: Self-pay | Admitting: Internal Medicine

## 2021-05-23 DIAGNOSIS — F4321 Adjustment disorder with depressed mood: Secondary | ICD-10-CM | POA: Insufficient documentation

## 2021-05-23 DIAGNOSIS — K5730 Diverticulosis of large intestine without perforation or abscess without bleeding: Secondary | ICD-10-CM | POA: Insufficient documentation

## 2021-05-23 DIAGNOSIS — E441 Mild protein-calorie malnutrition: Secondary | ICD-10-CM | POA: Diagnosis present

## 2021-05-23 DIAGNOSIS — R55 Syncope and collapse: Secondary | ICD-10-CM | POA: Diagnosis present

## 2021-05-23 DIAGNOSIS — K2100 Gastro-esophageal reflux disease with esophagitis, without bleeding: Secondary | ICD-10-CM | POA: Diagnosis present

## 2021-05-23 DIAGNOSIS — F322 Major depressive disorder, single episode, severe without psychotic features: Secondary | ICD-10-CM

## 2021-05-23 DIAGNOSIS — I700 Atherosclerosis of aorta: Secondary | ICD-10-CM | POA: Diagnosis present

## 2021-05-23 DIAGNOSIS — G629 Polyneuropathy, unspecified: Secondary | ICD-10-CM

## 2021-05-23 DIAGNOSIS — N183 Chronic kidney disease, stage 3 unspecified: Secondary | ICD-10-CM | POA: Diagnosis present

## 2021-05-23 DIAGNOSIS — F419 Anxiety disorder, unspecified: Secondary | ICD-10-CM | POA: Insufficient documentation

## 2021-05-23 DIAGNOSIS — E7800 Pure hypercholesterolemia, unspecified: Secondary | ICD-10-CM | POA: Diagnosis present

## 2021-05-23 LAB — TROPONIN I (HIGH SENSITIVITY)
Troponin I (High Sensitivity): 4 ng/L (ref <18)
Troponin I (High Sensitivity): 3 ng/L (ref <18)

## 2021-05-23 LAB — CBC WITH DIFFERENTIAL/PLATELET
Abs Immature Granulocytes: 0.04 10*3/uL (ref 0.00–0.07)
Basophils Absolute: 0.1 10*3/uL (ref 0.0–0.1)
Basophils Relative: 1 %
Eosinophils Absolute: 0.1 10*3/uL (ref 0.0–0.5)
Eosinophils Relative: 2 %
HCT: 34.5 % — ABNORMAL LOW (ref 36.0–46.0)
Hemoglobin: 11.4 g/dL — ABNORMAL LOW (ref 12.0–15.0)
Immature Granulocytes: 1 %
Lymphocytes Relative: 22 %
Lymphs Abs: 1.9 10*3/uL (ref 0.7–4.0)
MCH: 31 pg (ref 26.0–34.0)
MCHC: 33 g/dL (ref 30.0–36.0)
MCV: 93.8 fL (ref 80.0–100.0)
Monocytes Absolute: 0.8 10*3/uL (ref 0.1–1.0)
Monocytes Relative: 10 %
Neutro Abs: 5.3 10*3/uL (ref 1.7–7.7)
Neutrophils Relative %: 64 %
Platelets: 242 10*3/uL (ref 150–400)
RBC: 3.68 MIL/uL — ABNORMAL LOW (ref 3.87–5.11)
RDW: 13.7 % (ref 11.5–15.5)
WBC: 8.3 10*3/uL (ref 4.0–10.5)
nRBC: 0 % (ref 0.0–0.2)

## 2021-05-23 LAB — COMPREHENSIVE METABOLIC PANEL
ALT: 16 U/L (ref 0–44)
AST: 21 U/L (ref 15–41)
Albumin: 3.4 g/dL — ABNORMAL LOW (ref 3.5–5.0)
Alkaline Phosphatase: 94 U/L (ref 38–126)
Anion gap: 8 (ref 5–15)
BUN: 16 mg/dL (ref 8–23)
CO2: 23 mmol/L (ref 22–32)
Calcium: 9.1 mg/dL (ref 8.9–10.3)
Chloride: 105 mmol/L (ref 98–111)
Creatinine, Ser: 0.97 mg/dL (ref 0.44–1.00)
GFR, Estimated: 58 mL/min — ABNORMAL LOW (ref >60)
Glucose, Bld: 113 mg/dL — ABNORMAL HIGH (ref 70–99)
Potassium: 3.7 mmol/L (ref 3.5–5.1)
Sodium: 136 mmol/L (ref 135–145)
Total Bilirubin: 0.5 mg/dL (ref 0.3–1.2)
Total Protein: 6.5 g/dL (ref 6.5–8.1)

## 2021-05-23 LAB — ECHOCARDIOGRAM COMPLETE
AR max vel: 1.51 cm2
AV Area VTI: 1.59 cm2
AV Area mean vel: 1.53 cm2
AV Mean grad: 3 mmHg
AV Peak grad: 5.6 mmHg
Ao pk vel: 1.18 m/s
Area-P 1/2: 2.73 cm2
Height: 67 in
S' Lateral: 1.4 cm
Single Plane A4C EF: 80.7 %
Weight: 1822.4 oz

## 2021-05-23 LAB — CBC
HCT: 34.8 % — ABNORMAL LOW (ref 36.0–46.0)
Hemoglobin: 11.6 g/dL — ABNORMAL LOW (ref 12.0–15.0)
MCH: 31.4 pg (ref 26.0–34.0)
MCHC: 33.3 g/dL (ref 30.0–36.0)
MCV: 94.1 fL (ref 80.0–100.0)
Platelets: 237 10*3/uL (ref 150–400)
RBC: 3.7 MIL/uL — ABNORMAL LOW (ref 3.87–5.11)
RDW: 13.5 % (ref 11.5–15.5)
WBC: 8.6 10*3/uL (ref 4.0–10.5)
nRBC: 0 % (ref 0.0–0.2)

## 2021-05-23 LAB — CREATININE, SERUM
Creatinine, Ser: 0.93 mg/dL (ref 0.44–1.00)
GFR, Estimated: >60 mL/min (ref >60)

## 2021-05-23 LAB — CBG MONITORING, ED: Glucose-Capillary: 116 mg/dL — ABNORMAL HIGH (ref 70–99)

## 2021-05-23 MED ORDER — SALINE SPRAY 0.65 % NA SOLN
1.0000 | NASAL | Status: DC | PRN
Start: 2021-05-23 — End: 2021-05-28
  Administered 2021-05-23: 1 via NASAL
  Filled 2021-05-23: qty 44

## 2021-05-23 MED ORDER — ONDANSETRON HCL 4 MG/2ML IJ SOLN
INTRAMUSCULAR | Status: AC
  Administered 2021-05-23: 4 mg
  Filled 2021-05-23: qty 2

## 2021-05-23 MED ORDER — IOHEXOL 350 MG/ML SOLN
75.0000 mL | Freq: Once | INTRAVENOUS | Status: AC | PRN
  Administered 2021-05-23: 75 mL via INTRAVENOUS

## 2021-05-23 MED ORDER — ONDANSETRON HCL 4 MG/2ML IJ SOLN
4.0000 mg | Freq: Four times a day (QID) | INTRAMUSCULAR | Status: DC | PRN
Start: 2021-05-23 — End: 2021-05-28

## 2021-05-23 MED ORDER — ONDANSETRON HCL 4 MG PO TABS: 4.0000 mg | ORAL_TABLET | Freq: Four times a day (QID) | ORAL | Status: DC | PRN

## 2021-05-23 MED ORDER — ACETAMINOPHEN 650 MG RE SUPP: 650.0000 mg | Freq: Four times a day (QID) | RECTAL | Status: DC | PRN

## 2021-05-23 MED ORDER — ACETAMINOPHEN 650 MG RE SUPP: 325.0000 mg | Freq: Four times a day (QID) | RECTAL | Status: DC | PRN

## 2021-05-23 MED ORDER — ACETAMINOPHEN 325 MG PO TABS
325.0000 mg | ORAL_TABLET | Freq: Four times a day (QID) | ORAL | Status: DC | PRN
  Administered 2021-05-23 – 2021-05-26 (×5): 325 mg via ORAL
  Filled 2021-05-23 (×5): qty 1

## 2021-05-23 MED ORDER — ACETAMINOPHEN 325 MG PO TABS: 650.0000 mg | ORAL_TABLET | Freq: Four times a day (QID) | ORAL | Status: DC | PRN

## 2021-05-23 MED ORDER — SODIUM CHLORIDE 0.9% FLUSH
3.0000 mL | Freq: Two times a day (BID) | INTRAVENOUS | Status: DC
  Administered 2021-05-23 – 2021-05-26 (×6): 3 mL via INTRAVENOUS

## 2021-05-23 MED ORDER — ENOXAPARIN SODIUM 30 MG/0.3ML IJ SOSY
30.0000 mg | PREFILLED_SYRINGE | INTRAMUSCULAR | Status: DC
  Administered 2021-05-23: 30 mg via SUBCUTANEOUS
  Filled 2021-05-23: qty 0.3

## 2021-05-23 MED ORDER — ENOXAPARIN SODIUM 40 MG/0.4ML IJ SOSY: 40.0000 mg | PREFILLED_SYRINGE | INTRAMUSCULAR | Status: DC

## 2021-05-23 MED ORDER — SODIUM CHLORIDE 0.9 % IV BOLUS
500.0000 mL | Freq: Once | INTRAVENOUS | Status: AC
  Administered 2021-05-23: 500 mL via INTRAVENOUS

## 2021-05-23 NOTE — Plan of Care (Signed)
  Problem: Education: Goal: Knowledge of General Education information will improve Description Including pain rating scale, medication(s)/side effects and non-pharmacologic comfort measures Outcome: Progressing   

## 2021-05-23 NOTE — ED Notes (Signed)
Pt called out for restroom assistance. This RN and Marcelino Duster NT provided stand by assist. Pt ambulated slowly to restroom with a steady gait. When pt reached the toilet, her knees began to buckle and she was assisted to sit on the toilet. Pt's eyes turned glassy and arms drooped. She began drooling and shut her jaws a few times, also became diaphoretic. Not full LOC noted, pt was able to respond slowly to voice throughout. Stretcher brought to restroom and EDP called to bedside. Pt lifted to stretcher, VS and CBG obtained. Pt endorsed nausea, verbal order for 4mg  zofran received. EDP at bedside for evaluation. Orders received. ?

## 2021-05-23 NOTE — ED Provider Notes (Signed)
?  Physical Exam  ?BP (!) 131/96   Pulse 64   Temp 97.8 ?F (36.6 ?C) (Oral)   Resp (!) 21   Ht 5\' 7"  (1.702 m)   Wt 51.7 kg   SpO2 100%   BMI 17.84 kg/m?  ? ?Physical Exam ? ?Procedures  ?Procedures ? ?ED Course / MDM  ?  ?Medical Decision Making ?Amount and/or Complexity of Data Reviewed ?Labs: ordered. ?Radiology: ordered. ? ?Risk ?OTC drugs. ?Prescription drug management. ?Decision regarding hospitalization. ? ? ?Received care of patient from previous providers. Briefly this is an 81 yo female who initially presented with abdominal and back pain then reported SI and has been awaiting psychiatry placement who has had a few near-syncopal episodes. ? ?Had an episode on Friday, another episode last night. Both in setting of getting up to use the bathroom, was hydrated and had labs repeated last night which were WNL. ? ?Today, she went to the bathroom and prior to sitting down became minimally responsive with near-syncope, also reported chest pain and dyspnea.  ? ?CT PE study completed without PE.  Troponin negative. ? ?Given this is 3rd event in a few days, associated symptoms with this event, feel admission with further evaluation for near-syncope is indicated prior to psychiatric placement.  ? ? ? ? ?  ?Friday, MD ?05/24/21 [phone] ? ?

## 2021-05-23 NOTE — Consult Note (Signed)
[facility] Psych ED Progress Note ? ?05/23/2021 11:31 AM ?Lynn Price  ?MRN:  [id number] ? ? ?Method of visit?: Face to Face  ? ?Subjective:  "  I want to go home,I am not happy staying here" ?81 years old female seen this morning waiting for hospitalization in a Gero/Psych unit for treatment of depression and anxiety. Patient was seen in the main ER this morning awake, alert and oriented x4.  She recognized and remembered speaking with provider yesterday.  She admitted feeling sad, depressed and anxious and want to go home.  She had an episode of syncope last night as she walked to the bathroom.  She did not fall but has been worked up by ER Physician.  Plan is to admit her in the Medical floor and she already has a room to be transferred to.  She will be seen daily in the Medical unit by Psychiatry.  She denied SI/HI/AVH and no mention of Paranoia.  Cymbalta low dose of 20 mg daily started yesterday and Ativan as needed for severe anxiety in place. ?Principal Problem: Near syncope ?Diagnosis:  Principal Problem: ?  Near syncope ?Active Problems: ?  Obstructive sleep apnea ?  Essential hypertension, benign ?  Depression, major, single episode, severe ([place]) ?  Gastroesophageal reflux disease with esophagitis without hemorrhage ?  Pure hypercholesterolemia ?  Peripheral neuropathy ?  Chronic kidney disease, stage 3 unspecified ([place]) ?  Mild protein malnutrition ([place]) ? ?Total Time spent with patient: 20 minutes ? ?Past Psychiatric History: Unremarkable except for hx of  Anxiety and Memory loss  but patient is alert and oriented 5.  She denied hx of inpatient hospitalization for mental illness but have been taking Ativan for years. ? ?Past Medical History:  ?Past Medical History:  ?Diagnosis Date  ? Anxiety   ? Fibromyalgia   ? GERD (gastroesophageal reflux disease)   ? High blood pressure   ? HSV (herpes simplex virus) anogenital infection   ? IBS (irritable bowel syndrome)   ? Memory loss   ? Neuropathy   ? Obesity (BMI  30-39.9) 04/01/2014  ? Osteoarthritis   ? Overactive bladder   ? Restless leg syndrome   ? Sleep apnea   ?  ?Past Surgical History:  ?Procedure Laterality Date  ? APPENDECTOMY    ? CATARACT EXTRACTION Left   ? CESAREAN SECTION    ? CHOLECYSTECTOMY    ? colonscopy    ? MULTIPLE TOOTH EXTRACTIONS    ? ROTATOR CUFF REPAIR Left   ? TOTAL KNEE ARTHROPLASTY    ? ?Family History:  ?Family History  ?Problem Relation Age of Onset  ? Depression Mother   ? Depression Father   ? Mental illness Sister   ? Mental illness Sister   ? ?Family Psychiatric  History: Per Nursing document, patient's sister committed suicide by shooting self in the bath tub. ?Social History:  ?Social History  ? ?Substance and Sexual Activity  ?Alcohol Use Not Currently  ? Comment: wine occas  ?   ?Social History  ? ?Substance and Sexual Activity  ?Drug Use No  ?  ?Social History  ? ?Socioeconomic History  ? Marital status: Widowed  ?  Spouse name: Nadara Mustard  ? Number of children: 2  ? Years of education: college  ? Highest education level: Associate degree: academic program  ?Occupational History  ?  Comment: retired  ?Tobacco Use  ? Smoking status: Never  ? Smokeless tobacco: Never  ?Vaping Use  ? Vaping Use: Never used  ?  Substance and Sexual Activity  ? Alcohol use: Not Currently  ?  Comment: wine occas  ? Drug use: No  ? Sexual activity: Not on file  ?Other Topics Concern  ? Not on file  ?Social History Narrative  ? Patient passed away Nov 22, 2019, and she is retired. Patient has 2 years of college. Patient has 2 children.  ? 12/09/20 lives at [facility], independent living    ? ?Social Determinants of Health  ? ?Financial Resource Strain: Not on file  ?Food Insecurity: Not on file  ?Transportation Needs: Not on file  ?Physical Activity: Not on file  ?Stress: Not on file  ?Social Connections: Not on file  ? ? ?Sleep: Fair ? ?Appetite:  Fair ? ?Current Medications: ?Current Facility-Administered Medications  ?Medication Dose Route Frequency Provider Last Rate  Last Admin  ? acetaminophen (TYLENOL) tablet 325 mg  325 mg Oral Q6H PRN Reubin Milan, MD      ? Or  ? acetaminophen (TYLENOL) suppository 325 mg  325 mg Rectal Q6H PRN Reubin Milan, MD      ? acyclovir (ZOVIRAX) tablet 400 mg  400 mg Oral BID Davonna Belling, MD   400 mg at 05/23/21 1050  ? docusate sodium (COLACE) capsule 100 mg  100 mg Oral Q12H Davonna Belling, MD   100 mg at 05/23/21 1049  ? donepezil (ARICEPT) tablet 5 mg  5 mg Oral QHS Davonna Belling, MD   5 mg at 05/22/21 2147  ? DULoxetine (CYMBALTA) DR capsule 20 mg  20 mg Oral Daily Seneca Hoback C, NP   20 mg at 05/23/21 1049  ? enoxaparin (LOVENOX) injection 30 mg  30 mg Subcutaneous Q24H Reubin Milan, MD      ? gabapentin (NEURONTIN) capsule 100 mg  100 mg Oral BID Charmaine Downs C, NP   100 mg at 05/23/21 1049  ? LORazepam (ATIVAN) tablet 0.25 mg  0.25 mg Oral BID PRN Charmaine Downs C, NP   0.25 mg at 05/23/21 0401  ? melatonin tablet 10 mg  10 mg Oral Lowanda Foster, MD   10 mg at 05/22/21 2146  ? ondansetron (ZOFRAN) tablet 4 mg  4 mg Oral Q6H PRN Reubin Milan, MD      ? Or  ? ondansetron [facility]) injection 4 mg  4 mg Intravenous Q6H PRN Reubin Milan, MD      ? sodium chloride flush (NS) 0.9 % injection 3 mL  3 mL Intravenous Q12H Reubin Milan, MD   3 mL at 05/23/21 1052  ? ?Current Outpatient Medications  ?Medication Sig Dispense Refill  ? acetaminophen (TYLENOL) 325 MG tablet Take 325 mg by mouth See admin instructions. 325 mg twice a day and can take every 6 hours as needed for pain/headache    ? acyclovir (ZOVIRAX) 400 MG tablet Take 400 mg by mouth 2 (two) times daily.     ? diltiazem (CARDIZEM CD) 120 MG 24 hr capsule Take 120 mg by mouth daily.    ? docusate sodium (COLACE) 100 MG capsule Take 1 capsule (100 mg total) by mouth every 12 (twelve) hours. 60 capsule 0  ? donepezil (ARICEPT) 5 MG tablet TAKE 1 TABLET BY MOUTH AT  BEDTIME (Patient taking differently: Take 5 mg by  mouth at bedtime.) 90 tablet 3  ? gabapentin (NEURONTIN) 300 MG capsule Take 1 capsule by mouth at bedtime only. (Patient taking differently: Take 300 mg by mouth at bedtime.) 30 capsule 0  ? LORazepam (ATIVAN)  1 MG tablet Take 0.5 mg by mouth 2 (two) times daily as needed for anxiety. Take 1/2 tablet = 0.25mg  by mouth 2 times daily as needed for anxiety    ? Melatonin 10 MG CAPS Take 10 mg by mouth at bedtime.    ? Omega-3 Fatty Acids (FISH OIL) 1200 MG CAPS Take 1,200 mg by mouth daily.     ? polyethylene glycol (MIRALAX / GLYCOLAX) 17 g packet Take 17 g by mouth daily. 14 each 0  ? ondansetron (ZOFRAN-ODT) 4 MG disintegrating tablet 4mg  ODT q4 hours prn nausea/vomit 30 tablet 0  ? sodium phosphate (FLEET) 7-19 GM/118ML ENEM Place 133 mLs (1 enema total) rectally daily as needed for up to 5 doses for severe constipation. 665 mL 0  ? ? ?Lab Results:  ?Results for orders placed or performed during the hospital encounter of 05/21/21 (from the past 48 hour(s))  ?Hepatic function panel     Status: None  ? Collection Time: 05/21/21 11:36 AM  ?Result Value Ref Range  ? Total Protein 7.8 6.5 - 8.1 g/dL  ? Albumin 4.6 3.5 - 5.0 g/dL  ? AST 19 15 - 41 U/L  ? ALT 18 0 - 44 U/L  ? Alkaline Phosphatase 92 38 - 126 U/L  ? Total Bilirubin 1.1 0.3 - 1.2 mg/dL  ? Bilirubin, Direct 0.2 0.0 - 0.2 mg/dL  ? Indirect Bilirubin 0.9 0.3 - 0.9 mg/dL  ?  Comment: Performed at [facility], [address] [place], [place] [address]  ?Acetaminophen level     Status: None  ? Collection Time: 05/21/21 11:36 AM  ?Result Value Ref Range  ? Acetaminophen (Tylenol), Serum 12 10 - 30 ug/mL  ?  Comment: (NOTE) ?Therapeutic concentrations vary significantly. A range of 10-30 ug/mL  ?may be an effective concentration for many patients. However, some  ?are best treated at concentrations outside of this range. ?Acetaminophen concentrations >150 ug/mL at 4 hours after ingestion  ?and >50 ug/mL at 12 hours after ingestion are often  associated with  ?toxic reactions. ? ?Performed at [facility], [address], ?[place], [place] [address] ?  ?Salicylate level     Status: Abnormal  ? Collection Time: 05/21/21 11:36

## 2021-05-23 NOTE — ED Notes (Signed)
Patient transported to CT 

## 2021-05-23 NOTE — Progress Notes (Signed)
Caregiver [name] is aware patient has been admitted to Room 1510.  Attempted to call daughter, son and grandson with no answer.  ?

## 2021-05-23 NOTE — ED Notes (Addendum)
Patient passed out before using the bathroom.  RN and Tech hold and carried patient to the bed. Pt got very diaphoretic and pale. Pt c/o nausea and tingling sensation on her legs. Intial BP was 144/88 and dropped to 98/55 after 5 mins. EDP informed. IV fluids started. ?

## 2021-05-23 NOTE — Progress Notes (Signed)
Daughter Kiefer notified of move to 5E as well as given update on patient condition. ? ?

## 2021-05-23 NOTE — ED Provider Notes (Signed)
Patient had a second episode of dizziness, near syncope with standing.  Repeat lab work is reassuring.  No changes noted.  Patient given IV fluids.  No indication for admission at this time. ?  ?Gilda Crease, MD ?05/23/21 [phone] ? ?

## 2021-05-23 NOTE — H&P (Signed)
?History and Physical  ? ? ?[name] [id number] DOB: [birth year] ?DOA: 05/21/2021 ?DOS: the patient was seen and examined on 05/23/2021 ?PCP: Lynn Arabian, MD  ?Patient coming from: Home ? ?Chief Complaint:  ?Chief Complaint  ?Patient presents with  ? Back Pain  ? Fall  ? Suicidal  ? ?HPI: Lynn Price is a 81 y.o. female with medical history significant of anxiety, fibromyalgia, GERD, hypertension, HSV unknown dental infection, IBS, neuropathy, memory loss, underweight (used to have class I obesity, osteoarthritis, overactive bladder, restless leg syndrome, sleep apnea who came to the emergency department 2 days ago with a history of back pain following a fall at home.  She has been having lightheadedness and some falls at home without any major injury, but has consistently fallen on her buttock or lower back developing pain.  She also stated that she was suicidal and has been followed over the last 2 days by psychiatry.  While in the ED, she has multiple episodes of postural lightheadedness followed by nausea without emesis while getting out of bed or going to the bathroom.  She stated she was just taking MiraLAX at home.  She has been restarted on her medication in the ED.  She feels anxious occasionally having palpitations, mild dyspnea and chest pressure.  No fever, night sweats, but positive chills.  Her appetite fluctuates between good or bad.  She has been having difficulty sleeping and only sleeps 3 or 4 hours a night.  No rhinorrhea, sore throat, wheezing or hemoptysis.  She gets occasionally constipated, but no abdominal pain, diarrhea, melena or hematochezia.  No flank pain, dysuria, frequency or hematuria.  No polyuria, polydipsia, polyphagia or blurred vision.  Patient stated that occasionally her feet get "tingly" ? ?ED course: Initial vital signs were temperature 97.4 ?F, pulse 70, respirations 16, BP 123/78 mmHg O2 sat 99% on room air.  The patient received a 500 mL NS bolus  earlier. ? ?Lab work: Her urinalysis showed trace leukocyte esterase but was otherwise unremarkable.  CBC and LFTs normal.  BMP showed a creatinine of 1.04 and calcium of 10.7 mg/dL, but was otherwise unremarkable.  Calcium today was 9.1 and albumin 3.4 g/dL.  Troponin level x1 negative.  Second troponin level is pending.  Repeat CBC today with a white count of 8.6, hemoglobin 11.6 g/dL platelet 237. ? ?Imaging: Pelvic x-rays with no malalignment or acute fracture.  However, there is calcific tendinopathy of the hamstring muscle insertions along the inferior pubic rami bilaterally.  One-view portable chest with no acute cardiopulmonary pathology.  CTA chest done today with no evidence of PE or any other acute findings.  There was aortic atherosclerosis.  Please see images and full radiology report for further details.. ?  ?Review of Systems: As mentioned in the history of present illness. All other systems reviewed and are negative. ?Past Medical History:  ?Diagnosis Date  ? Anxiety   ? Fibromyalgia   ? GERD (gastroesophageal reflux disease)   ? High blood pressure   ? HSV (herpes simplex virus) anogenital infection   ? IBS (irritable bowel syndrome)   ? Memory loss   ? Neuropathy   ? Obesity (BMI 30-39.9) 04/01/2014  ? Osteoarthritis   ? Overactive bladder   ? Restless leg syndrome   ? Sleep apnea   ? ?Past Surgical History:  ?Procedure Laterality Date  ? APPENDECTOMY    ? CATARACT EXTRACTION Left   ? CESAREAN SECTION    ? CHOLECYSTECTOMY    ?  colonscopy    ? MULTIPLE TOOTH EXTRACTIONS    ? ROTATOR CUFF REPAIR Left   ? TOTAL KNEE ARTHROPLASTY    ? ?Social History:  reports that she has never smoked. She has never used smokeless tobacco. She reports that she does not currently use alcohol. She reports that she does not use drugs. ? ?Allergies  ?Allergen Reactions  ? Erythromycin Rash  ? Penicillins Rash and Other (See Comments)  ?  Remote reaction, no medical attention required ?No cutaneous or anaphylactic  symptoms ?Tolerates Keflex  ? Sulfa Antibiotics Rash  ? Tetracyclines & Related Rash  ? ? ?Family History  ?Problem Relation Age of Onset  ? Depression Mother   ? Depression Father   ? Mental illness Sister   ? Mental illness Sister   ? ? ?Prior to Admission medications   ?Medication Sig Start Date End Date Taking? Authorizing Provider  ?acetaminophen (TYLENOL) 325 MG tablet Take 325 mg by mouth See admin instructions. 325 mg twice a day and can take every 6 hours as needed for pain/headache   Yes [provider]  ?acyclovir (ZOVIRAX) 400 MG tablet Take 400 mg by mouth 2 (two) times daily.    Yes [provider]  ?diltiazem (CARDIZEM CD) 120 MG 24 hr capsule Take 120 mg by mouth daily.   Yes [provider]  ?docusate sodium (COLACE) 100 MG capsule Take 1 capsule (100 mg total) by mouth every 12 (twelve) hours. 04/04/21  Yes Mesner, Corene Cornea, MD  ?donepezil (ARICEPT) 5 MG tablet TAKE 1 TABLET BY MOUTH AT  BEDTIME ?Patient taking differently: Take 5 mg by mouth at bedtime. 09/24/20  Yes Suzzanne Cloud, NP  ?gabapentin (NEURONTIN) 300 MG capsule Take 1 capsule by mouth at bedtime only. ?Patient taking differently: Take 300 mg by mouth at bedtime. 03/07/19  Yes Deveshwar, Abel Presto, MD  ?LORazepam (ATIVAN) 1 MG tablet Take 0.5 mg by mouth 2 (two) times daily as needed for anxiety. Take 1/2 tablet = 0.25mg  by mouth 2 times daily as needed for anxiety   Yes [provider]  ?Melatonin 10 MG CAPS Take 10 mg by mouth at bedtime.   Yes [provider]  ?Omega-3 Fatty Acids (FISH OIL) 1200 MG CAPS Take 1,200 mg by mouth daily.    Yes [provider]  ?polyethylene glycol (MIRALAX / GLYCOLAX) 17 g packet Take 17 g by mouth daily. 04/04/21  Yes Mesner, Corene Cornea, MD  ?ondansetron (ZOFRAN-ODT) 4 MG disintegrating tablet 4mg  ODT q4 hours prn nausea/vomit 04/04/21   Mesner, Corene Cornea, MD  ?sodium phosphate (FLEET) 7-19 GM/118ML ENEM Place 133 mLs (1 enema total) rectally daily as needed for up to 5  doses for severe constipation. 04/04/21   Mesner, Corene Cornea, MD  ? ? ?Physical Exam: ?Vitals:  ? 05/23/21 0445 05/23/21 0730 05/23/21 1030 05/23/21 1100  ?BP:  (!) 131/96 126/68 120/69  ?Pulse:  64 70 71  ?Resp: 12 (!) 21 13 18   ?Temp:   97.7 ?F (36.5 ?C)   ?TempSrc:   Oral   ?SpO2:  100% 100% 100%  ?Weight:      ?Height:      ? ?Physical Exam ?Vitals and nursing note reviewed.  ?Constitutional:   ?   Appearance: Normal appearance.  ?HENT:  ?   Head: Normocephalic and atraumatic.  ?   Mouth/Throat:  ?   Mouth: Mucous membranes are moist.  ?Eyes:  ?   General: No scleral icterus. ?   Extraocular Movements: Extraocular movements intact.  ?  Pupils: Pupils are equal, round, and reactive to light.  ?Neck:  ?   Vascular: No carotid bruit or JVD.  ?Cardiovascular:  ?   Rate and Rhythm: Normal rate and regular rhythm.  ?   Heart sounds: S1 normal and S2 normal.  ?Pulmonary:  ?   Effort: Pulmonary effort is normal. No respiratory distress.  ?   Breath sounds: Normal breath sounds. No wheezing.  ?Abdominal:  ?   General: There is no distension.  ?   Palpations: Abdomen is soft.  ?   Tenderness: There is no abdominal tenderness. There is no guarding.  ?Musculoskeletal:  ?   Cervical back: Neck supple.  ?   Right lower leg: No edema.  ?   Left lower leg: No edema.  ?Skin: ?   General: Skin is warm and dry.  ?Neurological:  ?   General: No focal deficit present.  ?   Mental Status: She is alert and oriented to person, place, and time.  ? ? ?Data Reviewed: ? ?There are no new results to review at this time. ? ?EKG: ?Vent. rate 63 BPM ?PR interval 190 ms ?QRS duration 98 ms ?QT/QTcB 418/428 ms ?P-R-T axes 60 -1 75 ?Sinus rhythm ?Low voltage, precordial leads ?RSR' in V1 or V2, probably normal variant ? ?Assessment and Plan: ?Principal Problem: ?  Near syncope ?Nonischemic EKG. ?Observation/telemetry. ?Discontinue diltiazem. ?Time-limited/gentle IV hydration. ?Check carotid Doppler. ?Check echocardiogram. ?Message sent for cardiology  consult in a.m. ? ?Active Problems: ?  Essential hypertension, benign ?Hold diltiazem. ?Anti-HTN may not be needed. ?Blood pressure has improved with weight loss. ?Continue monitor blood pressure. ? ?  Memory los

## 2021-05-24 ENCOUNTER — Observation Stay (HOSPITAL_COMMUNITY): Payer: Medicare Other

## 2021-05-24 ENCOUNTER — Encounter (HOSPITAL_COMMUNITY): Payer: Self-pay | Admitting: Student

## 2021-05-24 DIAGNOSIS — E441 Mild protein-calorie malnutrition: Secondary | ICD-10-CM | POA: Diagnosis present

## 2021-05-24 DIAGNOSIS — E7800 Pure hypercholesterolemia, unspecified: Secondary | ICD-10-CM | POA: Diagnosis present

## 2021-05-24 DIAGNOSIS — M797 Fibromyalgia: Secondary | ICD-10-CM | POA: Diagnosis present

## 2021-05-24 DIAGNOSIS — I700 Atherosclerosis of aorta: Secondary | ICD-10-CM | POA: Diagnosis not present

## 2021-05-24 DIAGNOSIS — K2100 Gastro-esophageal reflux disease with esophagitis, without bleeding: Secondary | ICD-10-CM | POA: Diagnosis present

## 2021-05-24 DIAGNOSIS — R55 Syncope and collapse: Secondary | ICD-10-CM | POA: Diagnosis not present

## 2021-05-24 DIAGNOSIS — I10 Essential (primary) hypertension: Secondary | ICD-10-CM

## 2021-05-24 DIAGNOSIS — R45851 Suicidal ideations: Secondary | ICD-10-CM | POA: Diagnosis not present

## 2021-05-24 DIAGNOSIS — Z881 Allergy status to other antibiotic agents status: Secondary | ICD-10-CM | POA: Diagnosis not present

## 2021-05-24 DIAGNOSIS — G4733 Obstructive sleep apnea (adult) (pediatric): Secondary | ICD-10-CM | POA: Diagnosis present

## 2021-05-24 DIAGNOSIS — N1831 Chronic kidney disease, stage 3a: Secondary | ICD-10-CM | POA: Diagnosis present

## 2021-05-24 DIAGNOSIS — Z880 Allergy status to penicillin: Secondary | ICD-10-CM | POA: Diagnosis not present

## 2021-05-24 DIAGNOSIS — F0394 Unspecified dementia, unspecified severity, with anxiety: Secondary | ICD-10-CM | POA: Diagnosis present

## 2021-05-24 DIAGNOSIS — G2581 Restless legs syndrome: Secondary | ICD-10-CM | POA: Diagnosis present

## 2021-05-24 DIAGNOSIS — N3281 Overactive bladder: Secondary | ICD-10-CM | POA: Diagnosis present

## 2021-05-24 DIAGNOSIS — F322 Major depressive disorder, single episode, severe without psychotic features: Secondary | ICD-10-CM | POA: Diagnosis present

## 2021-05-24 DIAGNOSIS — F0393 Unspecified dementia, unspecified severity, with mood disturbance: Secondary | ICD-10-CM | POA: Diagnosis present

## 2021-05-24 DIAGNOSIS — G629 Polyneuropathy, unspecified: Secondary | ICD-10-CM | POA: Diagnosis present

## 2021-05-24 DIAGNOSIS — Z681 Body mass index (BMI) 19 or less, adult: Secondary | ICD-10-CM | POA: Diagnosis not present

## 2021-05-24 DIAGNOSIS — Z96651 Presence of right artificial knee joint: Secondary | ICD-10-CM | POA: Diagnosis present

## 2021-05-24 DIAGNOSIS — K589 Irritable bowel syndrome without diarrhea: Secondary | ICD-10-CM | POA: Diagnosis present

## 2021-05-24 DIAGNOSIS — I129 Hypertensive chronic kidney disease with stage 1 through stage 4 chronic kidney disease, or unspecified chronic kidney disease: Secondary | ICD-10-CM | POA: Diagnosis present

## 2021-05-24 DIAGNOSIS — Z20822 Contact with and (suspected) exposure to covid-19: Secondary | ICD-10-CM | POA: Diagnosis present

## 2021-05-24 DIAGNOSIS — I951 Orthostatic hypotension: Secondary | ICD-10-CM | POA: Diagnosis not present

## 2021-05-24 DIAGNOSIS — F419 Anxiety disorder, unspecified: Secondary | ICD-10-CM

## 2021-05-24 DIAGNOSIS — M1712 Unilateral primary osteoarthritis, left knee: Secondary | ICD-10-CM | POA: Diagnosis present

## 2021-05-24 DIAGNOSIS — R296 Repeated falls: Secondary | ICD-10-CM | POA: Diagnosis present

## 2021-05-24 LAB — GLUCOSE, CAPILLARY: Glucose-Capillary: 89 mg/dL (ref 70–99)

## 2021-05-24 MED ORDER — HYDROCODONE-ACETAMINOPHEN 5-325 MG PO TABS
1.0000 | ORAL_TABLET | Freq: Three times a day (TID) | ORAL | Status: DC | PRN
  Administered 2021-05-24 – 2021-05-27 (×6): 1 via ORAL
  Filled 2021-05-24 (×7): qty 1

## 2021-05-24 MED ORDER — ENOXAPARIN SODIUM 40 MG/0.4ML IJ SOSY
40.0000 mg | PREFILLED_SYRINGE | INTRAMUSCULAR | Status: DC
  Administered 2021-05-24 – 2021-05-26 (×3): 40 mg via SUBCUTANEOUS
  Filled 2021-05-24 (×3): qty 0.4

## 2021-05-24 MED ORDER — SODIUM CHLORIDE 0.9 % IV BOLUS
1000.0000 mL | Freq: Once | INTRAVENOUS | Status: AC
  Administered 2021-05-24: 1000 mL via INTRAVENOUS

## 2021-05-24 MED ORDER — OMEGA-3-ACID ETHYL ESTERS 1 G PO CAPS
1.0000 g | ORAL_CAPSULE | Freq: Every day | ORAL | Status: DC
Start: 2021-05-25 — End: 2021-05-28
  Administered 2021-05-25 – 2021-05-27 (×3): 1 g via ORAL
  Filled 2021-05-24 (×3): qty 1

## 2021-05-24 MED ORDER — POLYETHYLENE GLYCOL 3350 17 G PO PACK
17.0000 g | PACK | Freq: Every day | ORAL | Status: DC
  Administered 2021-05-24 – 2021-05-27 (×4): 17 g via ORAL
  Filled 2021-05-24 (×4): qty 1

## 2021-05-24 MED ORDER — LORAZEPAM 0.5 MG PO TABS
0.2500 mg | ORAL_TABLET | Freq: Three times a day (TID) | ORAL | Status: DC | PRN
  Administered 2021-05-24 – 2021-05-27 (×8): 0.25 mg via ORAL
  Filled 2021-05-24 (×8): qty 1

## 2021-05-24 MED ORDER — SODIUM CHLORIDE 0.9 % IV SOLN: INTRAVENOUS | Status: DC

## 2021-05-24 NOTE — Evaluation (Signed)
Occupational Therapy Evaluation ?Patient Details ?Name: Lynn Price ?MRN: [id number] ?DOB: [birth year] ?Today's Date: 05/24/2021 ? ? ?History of Present Illness 81 y.o. female adm  with  back pain following a fall at home, multiple near syncopal episodes while in the emergency department.  Negative pelvic x-ray no acute fracture calcific tendinopathy of the hamstring muscle insertion; chest x-ray negative findings CTA showed no evidence of PE or other acute finding.  EKG nonischemic medical history significant of anxiety, fibromyalgia, GERD, hypertension, HSV unknown dental infection, IBS, neuropathy, memory loss, osteoarthritis, overactive bladder, restless leg syndrome, sleep apnea  ? ?Clinical Impression ?  ?Patient is a 81 year old female who was admitted for above. Patient was noted to be min guard for ADL tasks with RW with noted HR increase to 147 bpm after attempting to void. Patient was noted to trend 125 bpm with standing to complete oral hygiene tasks. Patient is asymptomatic. Patient would continue to benefit from skilled OT services at this time while admitted and after d/c to address noted deficits in order to improve overall safety and independence in ADLs.  ?  ?   ? ?Recommendations for follow up therapy are one component of a multi-disciplinary discharge planning process, led by the attending physician.  Recommendations may be updated based on patient status, additional functional criteria and insurance authorization.  ? ?Follow Up Recommendations ? Home health OT (at ALF)  ?  ?Assistance Recommended at Discharge Frequent or constant Supervision/Assistance  ?Patient can return home with the following A little help with walking and/or transfers;A little help with bathing/dressing/bathroom;Assistance with cooking/housework;Direct supervision/assist for medications management;Direct supervision/assist for financial management;Help with stairs or ramp for entrance;Assist for transportation ? ?   ?Functional Status Assessment ? Patient has had a recent decline in their functional status and demonstrates the ability to make significant improvements in function in a reasonable and predictable amount of time.  ?Equipment Recommendations ? None recommended by OT  ?  ?Recommendations for Other Services   ? ? ?  ?Precautions / Restrictions Precautions ?Precautions: Fall ?Restrictions ?Weight Bearing Restrictions: No  ? ?  ? ?Mobility Bed Mobility ?Overal bed mobility: Modified Independent ?  ?  ?  ?  ?  ?  ?  ?  ? ?Transfers ?  ?  ?  ?  ?  ?  ?  ?  ?  ?  ?  ? ?  ?Balance Overall balance assessment: Needs assistance ?Sitting-balance support: Feet supported ?Sitting balance-Leahy Scale: Good ?  ?  ?Standing balance support: During functional activity ?Standing balance-Leahy Scale: Fair ?Standing balance comment: standing at sink to brush teeth ?  ?  ?  ?  ?  ?  ?  ?  ?  ?  ?  ?   ? ?ADL either performed or assessed with clinical judgement  ? ?ADL Overall ADL's : Needs assistance/impaired ?Eating/Feeding: Set up;Sitting ?  ?Grooming: Set up;Standing;Wash/dry face;Oral care ?Grooming Details (indicate cue type and reason): standing at sink with noetd increased HR to 125 bpm with standing tasks. ?Upper Body Bathing: Min guard;Sitting ?  ?Lower Body Bathing: Min guard;Sitting/lateral leans ?  ?Upper Body Dressing : Min guard;Sitting ?  ?Lower Body Dressing: Min guard;Sit to/from stand;Sitting/lateral leans ?Lower Body Dressing Details (indicate cue type and reason): patient was able to don/doff socks sitting on edge of bed with no c/o pain ?Toilet Transfer: Min guard;Rolling walker (2 wheels);Ambulation ?Toilet Transfer Details (indicate cue type and reason): with increased time. patient attempted to void bowels  with reports of constipation. nurse noted to enter room during session reporting HR was elevated HR noted to be 147bpm after attempting to void. nurse consuted reguarding patients constipation and typical  regimine at home. nurse verbalized understanding. ?Toileting- ArchitectClothing Manipulation and Hygiene: Min guard;Sit to/from stand ?  ?  ?  ?  ?   ? ? ? ?Vision Patient Visual Report: No change from baseline ?   ?   ?Perception   ?  ?Praxis   ?  ? ?Pertinent Vitals/Pain Pain Assessment ?Pain Assessment: Faces ?Faces Pain Scale: Hurts a little bit ?Pain Location: discomfort with movement ?Pain Descriptors / Indicators: Discomfort ?Pain Intervention(s): Monitored during session  ? ? ? ?Hand Dominance Right ?  ?Extremity/Trunk Assessment Upper Extremity Assessment ?Upper Extremity Assessment: Overall WFL for tasks assessed ?  ?Lower Extremity Assessment ?Lower Extremity Assessment: Defer to PT evaluation ?  ?Cervical / Trunk Assessment ?Cervical / Trunk Assessment: Normal ?  ?Communication Communication ?Communication: No difficulties ?  ?Cognition Arousal/Alertness: Awake/alert ?Behavior During Therapy: [facility] for tasks assessed/performed ?Overall Cognitive Status: Within Functional Limits for tasks assessed ?  ?  ?  ?  ?  ?  ?  ?  ?  ?  ?  ?  ?  ?  ?  ?  ?  ?  ?  ?General Comments    ? ?  ?Exercises   ?  ?Shoulder Instructions    ? ? ?Home Living Family/patient expects to be discharged to:: Assisted living ?  ?  ?  ?  ?  ?  ?  ?  ?  ?  ?  ?  ?  ?  ?Home Equipment: Rollator (4 wheels) ?  ?[facility] ?  ? ?  ?Prior Functioning/Environment Prior Level of Function : Independent/Modified Independent ?  ?  ?  ?  ?  ?  ?Mobility Comments: pt reports amb with rollator, amb to DR for meals ?  ?  ? ?  ?  ?OT Problem List: Decreased activity tolerance;Decreased knowledge of precautions;Decreased safety awareness;Cardiopulmonary status limiting activity;Decreased knowledge of use of DME or AE;Impaired balance (sitting and/or standing) ?  ?   ?OT Treatment/Interventions:    ?  ?OT Goals(Current goals can be found in the care plan section) Acute Rehab OT Goals ?Patient Stated Goal: to get fixident for  teeth ?OT Goal Formulation: With patient ?Time For Goal Achievement: 06/07/21 ?Potential to Achieve Goals: Fair  ?OT Frequency:   ?  ? ?Co-evaluation   ?  ?  ?  ?  ? ?  ?AM-PAC OT "6 Clicks" Daily Activity     ?Outcome Measure Help from another person eating meals?: None ?Help from another person taking care of personal grooming?: A Little ?Help from another person toileting, which includes using toliet, bedpan, or urinal?: A Little ?Help from another person bathing (including washing, rinsing, drying)?: A Little ?Help from another person to put on and taking off regular upper body clothing?: A Little ?Help from another person to put on and taking off regular lower body clothing?: A Little ?6 Click Score: 19 ?  ?End of Session Equipment Utilized During Treatment: Rolling walker (2 wheels) ?Nurse Communication: Other (comment) (ok to participate. checked on patient with increased HR during attempts to have BM.) ? ?Activity Tolerance: Patient tolerated treatment well ?Patient left: in bed;with call bell/phone within reach;with nursing/sitter in room ? ?OT Visit Diagnosis: Muscle weakness (generalized) (M62.81);History of falling (Z91.81)  ?              ?  Time: 6712-4580 ?OT Time Calculation (min): 29 min ?Charges:  OT General Charges ?$OT Visit: 1 Visit ?OT Evaluation ?$OT Eval Low Complexity: 1 Low ?OT Treatments ?$Self Care/Home Management : 8-22 mins ? ?Zelina Jimerson OTR/L, MS ?Acute Rehabilitation Department ?Office# [phone] ?Pager# [phone] ? ? ?Barnabas Lister [place] Gentle ?05/24/2021, 4:10 PM ?

## 2021-05-24 NOTE — Progress Notes (Signed)
OT Cancellation Note ? ?Patient Details ?Name: Lynn Price ?MRN: [id number] ?DOB: [birth year] ? ? ?Cancelled Treatment:    Reason Eval/Treat Not Completed: Other (comment) ?Patient noted to have had orthostatic BP this AM with nursing. Nurse asking to hold off until after lunch. OT to continue to follow and check back as schedule will allow. ?Suman Trivedi OTR/L, MS ?Acute Rehabilitation Department ?Office# [phone] ?Pager# [phone] ? ? ?05/24/2021, 12:05 PM ?

## 2021-05-24 NOTE — Consult Note (Signed)
?Cardiology Consultation:  ? ?Patient ID: Lynn Price ?MRN: [id number]; DOB: [birth year] ? ?Admit date: 05/21/2021 ?Date of Consult: 05/24/2021 ? ?PCP:  Lynn Arabian, MD ?  ?[facility] Providers ?Cardiologist:  None  ?Sleep Medicine:  Fransico Him, MD  ? ?Patient Profile:  ? ?Lynn Price is a 81 y.o. female with a history of hypertension, obstructive sleep apnea no longer using CPAP, fibromyalgia, GERD, IBS, anxiety/depression, dementia, and prior visual hallucinations who is being seen today for evaluation of syncope at the request of Dr. Olevia Bowens. ? ?History of Present Illness:  ? ?Lynn Price is a 81 year old female with the above history who is followed by Dr. Radford Pax for sleep apnea.  No known cardiac history.  She had a remote nuclear stress test in 2011 which was negative for ischemia. ? ?Patient presented to the [facility] ED on 05/21/2021 evaluation of multiple falls and back pain.  While in the ED, patient made multiple statement expressing her wish to die and was asking staff to give her segment to facilitate her death. She also reported that she had tried to take her life before but could not remember how.  Therefore, behavioral health was consulted she was felt to meet criteria for a geri-psych hospitalization. While waiting for a bed, patient had 2 episodes of near syncope that occurred with standing.  The first episode occurred after getting out after using the bathroom and is preceded by lightheadedness, nausea, and diaphoresis.  She also reported that very pale at the time.  Her initial BP following this was 144/88 but dropped to 98/55 after 5 minutes.  Therefore, she was given IV fluids.  Patient had a second similar episode of dizziness and near syncope while standing.  EKG shows normal sinus rhythm with no acute ischemic changes.  High-sensitivity troponin negative x2.  Other lab work unremarkable.  Patient was admitted to medical floor for further evaluation of near syncope.  Echo showed  LVEF of 65-70% with normal wall motion, mild asymmetric LVH of the basal-septal segment, and grade 1 diastolic dysfunction.  No significant valvular disease noted.  Carotid ultrasounds pending.  Cardiology consulted for assistance. ? ?At the time of this evaluation, patient resting comfortably no acute distress.  She has underlying dementia and has difficulty remembering a lot of details.  There is a sitter at bedside but no family.  Patient does not remember the 2 near syncopal episodes in the ED.  She does state that she has had multiple falls over the last 6 months.  She does not remember having any symptoms prior to these falls and thinks that her legs just give out on her but again she has some underlying memory issues.  There is note from 05/22/2021 10:55 AM where RN spoke with Lynn Price who has been patient's care giver for 14 years.  Lynn Price reported frequent falls and states she is fallen at least 5 times over the last month. She was seen in the ED after a fall in 03/2021 with facial trauma. Per ED note at that time, sounded like a mechanical fall that occurred after she attempted to walk without using her walker. She presented back to the ED the following day with vomiting and dizziesns and was felt to be dehydrated. When asked whether patient has ever had a syncopal episode, she states she thinks she has passed but cannot tell me when I give me any more details.  She states that she previously had problems with lightheadedness/dizziness around childbearing years that she  thought it was due to inner ear ear" issues. Patient does note intermittent palpitations with associated dizziness but is unable to elaborate further on this.  She was not on telemetry during the time of her first near syncopal episode but she was for the second and there were no concerning arrhythmias.  She was in sinus rhythm with rates in the 60s to 80s the whole time.  While waiting in the ED, she did reportedly complain of chest pain at  1 point but when she pointed to the area to RN it was more epigastric area.  EKG showed no acute ischemic changes and troponin was negative as above.  Pain improved after eating.  Patient reports intermittent epigastric pain but no chest pain or exertional symptoms.  She notes that mild dyspnea with exertion but none at rest.  No orthopnea, PND, or lower extremity edema.  She has had some mild nasal congestion here in the hospital but no other respiratory symptoms.  No recent fevers or illnesses. She states she has intermittent nausea at home but nothing significant. No abnormal bleeding in urine or stools. ? ?Past Medical History:  ?Diagnosis Date  ? Anxiety   ? Fibromyalgia   ? GERD (gastroesophageal reflux disease)   ? High blood pressure   ? HSV (herpes simplex virus) anogenital infection   ? IBS (irritable bowel syndrome)   ? Memory loss   ? Neuropathy   ? Obesity (BMI 30-39.9) 04/01/2014  ? Osteoarthritis   ? Overactive bladder   ? Restless leg syndrome   ? Sleep apnea   ? ? ?Past Surgical History:  ?Procedure Laterality Date  ? APPENDECTOMY    ? CATARACT EXTRACTION Left   ? CESAREAN SECTION    ? CHOLECYSTECTOMY    ? colonscopy    ? MULTIPLE TOOTH EXTRACTIONS    ? ROTATOR CUFF REPAIR Left   ? TOTAL KNEE ARTHROPLASTY    ?  ? ?Home Medications:  ?Prior to Admission medications   ?Medication Sig Start Date End Date Taking? Authorizing Provider  ?acetaminophen (TYLENOL) 325 MG tablet Take 325 mg by mouth See admin instructions. Take one tablet (325 mg) by mouth twice daily; may also take one tablet (325 mg) every 6 hours as needed for pain   Yes [provider]  ?acyclovir (ZOVIRAX) 400 MG tablet Take 400 mg by mouth 2 (two) times daily.    Yes [provider]  ?diltiazem (CARDIZEM CD) 120 MG 24 hr capsule Take 120 mg by mouth every morning.   Yes [provider]  ?docusate sodium (COLACE) 100 MG capsule Take 1 capsule (100 mg total) by mouth every 12 (twelve) hours. 04/04/21  Yes Mesner,  Corene Cornea, MD  ?donepezil (ARICEPT) 5 MG tablet TAKE 1 TABLET BY MOUTH AT  BEDTIME ?Patient taking differently: Take 5 mg by mouth at bedtime. 09/24/20  Yes Suzzanne Cloud, NP  ?gabapentin (NEURONTIN) 300 MG capsule Take 1 capsule by mouth at bedtime only. ?Patient taking differently: Take 300 mg by mouth at bedtime. 03/07/19  Yes Deveshwar, Abel Presto, MD  ?LORazepam (ATIVAN) 0.5 MG tablet Take 0.25 mg by mouth 2 (two) times daily as needed for anxiety.   Yes [provider]  ?Melatonin 10 MG CAPS Take 10 mg by mouth at bedtime.   Yes [provider]  ?Omega-3 Fatty Acids (FISH OIL) 1200 MG CAPS Take 1,200 mg by mouth every morning.   Yes [provider]  ?ondansetron (ZOFRAN-ODT) 4 MG disintegrating tablet 4mg  ODT q4 hours  prn nausea/vomit ?Patient taking differently: 4 mg every 4 (four) hours as needed for nausea or vomiting. 04/04/21  Yes Mesner, Corene Cornea, MD  ?polyethylene glycol (MIRALAX / GLYCOLAX) 17 g packet Take 17 g by mouth daily. 04/04/21  Yes Mesner, Corene Cornea, MD  ?sodium phosphate (FLEET) 7-19 GM/118ML ENEM Place 133 mLs (1 enema total) rectally daily as needed for up to 5 doses for severe constipation. ?Patient not taking: Reported on 05/23/2021 04/04/21   Merrily Pew, MD  ? ? ?Inpatient Medications: ?Scheduled Meds: ? acyclovir  400 mg Oral BID  ? docusate sodium  100 mg Oral Q12H  ? donepezil  5 mg Oral QHS  ? DULoxetine  20 mg Oral Daily  ? enoxaparin (LOVENOX) injection  30 mg Subcutaneous Q24H  ? gabapentin  100 mg Oral BID  ? melatonin  10 mg Oral QHS  ? sodium chloride flush  3 mL Intravenous Q12H  ? ?Continuous Infusions: ? ?PRN Meds: ?acetaminophen **OR** acetaminophen, LORazepam, ondansetron **OR** ondansetron (ZOFRAN) IV, sodium chloride ? ?Allergies:    ?Allergies  ?Allergen Reactions  ? Erythromycin Rash  ? Penicillins Rash and Other (See Comments)  ?  Remote reaction, no medical attention required ?No cutaneous or anaphylactic symptoms ?Tolerates Keflex  ? Sulfa Antibiotics Rash  ?  Tetracyclines & Related Rash  ? ? ?Social History:   ?Social History  ? ?Socioeconomic History  ? Marital status: Widowed  ?  Spouse name: Nadara Mustard  ? Number of children: 2  ? Years of education: colleg

## 2021-05-24 NOTE — TOC Progression Note (Addendum)
Transition of Care (TOC) - Progression Note  ? ? ?Patient Details  ?Name: Lynn Price ?MRN: [id number] ?Date of Birth: 1940-03-31 ? ?Transition of Care (TOC) CM/SW Contact  ?Ross Ludwig, LCSW ?Phone Number: ?05/24/2021, 5:01 PM ? ?Clinical Narrative:    ? ?CSW was informed that patient will need Geripsych placement.  CSW to look at [facility] once she is medically cleared.  Patient is from [place]. ? ? ?  ?  ? ?Expected Discharge Plan and Services ? Geripsych or back to [facility]. ?  ?  ?  ?  ?                ?  ?  ?  ?  ?  ?  ?  ?  ?  ?  ? ? ?Social Determinants of Health (SDOH) Interventions ?Depression Interventions/Treatment : Referral to Psychiatry, Medication, Counseling ? ?Readmission Risk Interventions ?   ? View : No data to display.  ?  ?  ?  ? ? ?

## 2021-05-24 NOTE — Progress Notes (Signed)
?PROGRESS NOTE ?Lynn Price  [id number] DOB: January 04, 1941 DOA: 05/21/2021 ?PCP: Gaynelle Arabian, MD  ? ?Brief Narrative/Hospital Course: ?I will71 y.o. female with medical history significant of anxiety, fibromyalgia, GERD, hypertension, HSV unknown dental infection, IBS, neuropathy, memory loss, underweight (used to have class I obesity), osteoarthritis, overactive bladder, restless leg syndrome, sleep apnea who came to the emergency department 2 days PTA [place] history of back pain following a fall at home. She has had multiple near syncopal episodes while in the emergency department. ?Work-up in the ED showed stable vital signs, fairly stable lab work 2.  Negative pelvic x-ray no acute fracture calcific tendinopathy of the hamstring muscle insertion chest x-ray negative findings CTA showed no evidence of PE or other acute finding, has aortic atherosclerosis.  EKG nonischemic ?She was admitted for evaluation of near syncope: Holding diltiazem, placed on telemetry, echocardiogram carotid duplex ordered and cardiology was consulted.  Echo showed EF 65 to 70%, G1 DD SF multiple mitral valve with mild MR. ?In the ED seen by psychiatry as well continue on Cymbalta for depression anxiety and pain along with Neurontin for mood/pain  ?  ?Subjective: ?Seen and examined this morning.  Remains with one-to-one sitter at bedside for suicide precaution. ?Alert awake, somewhat tearful talking about recent multiple deaths in family and her son is needing additional surgeries in [place]. ?Orthostatic 133/68 on lying dropped to 91/62 on standing and symptomatic ? ?Assessment and Plan: ?Principal Problem: ?  Near syncope ?Active Problems: ?  Memory loss ?  Essential hypertension, benign ?  Depression, major, single episode, severe ([place]) ?  Gastroesophageal reflux disease with esophagitis without hemorrhage ?  Pure hypercholesterolemia ?  Peripheral neuropathy ?  Chronic kidney disease, stage 3 unspecified ([place]) ?  Mild protein  malnutrition ([place]) ?  Aortic calcification ([facility]) ?  Orthostatic hypotension ?  ?Near syncope ?Orthostatic hypotension: ?This morning orthostatic and symptomatic giving a liter bolus and keep on gentle IV fluid hydration continue to hold diltiazem.  Echocardiogram unremarkable, carotid duplex pending, cardiology following closely less likely need further cardiac work-up.  Monitor orthostatics, PT OT evaluation ? ?Memory loss ?Depression, major, single episode, severe  ?Suicidal ideation ?: ?This morning alert awake appropriate, psychiatry on board continue one-to-one sitter as per psychiatry.  Continue current meds, including Cymbalta and lorazepam donezepil.  Further plan per psychiatry. ? ?GERD: Continue PPI ? ?Essential hypertension, benign: Orthostatic hypotension.  See #1 ? ?Pure hypercholesterolemia: Not on any therapy.  Follow-up with PCP ? ?Peripheral neuropathy: Continue Neurontin ? ?Chronic kidney disease, stage 3a: Stable at baseline ?Recent Labs  ?Lab 05/21/21 ?1000 05/23/21 ?0323 05/23/21 ?1100  ?BUN 19 16  --   ?CREATININE 1.04* 0.97 0.93  ?  ?Mild protein malnutrition : Consult dietitian ? ?Aortic calcification: Incidentally noted, outpatient cardiology follow-up ? ? Low Body mass index is 17.51 kg/m?Marland Kitchen  Consult dietitian augment nutritional status. ? ? ?DVT prophylaxis: enoxaparin (LOVENOX) injection 40 mg Start: 05/24/21 2200 ?Code Status:   Code Status: Full Code ?Family Communication: plan of care discussed with patient at bedside. ?Patient status is: Observation but will need at least 2 midnight stay for ongoing IV fluid hydration for symptomatic orthostatic hypotension level of care: Telemetry  ?Patient currently not stable ? ?Dispo: The patient is from: home ?           Anticipated disposition: TBD, PTOT PENDING ? ?Mobility Assessment (last 72 hours)   ? ? Mobility Assessment   ? ? [place] Name 05/24/21 0845 05/23/21 2119 05/23/21 1500  ?  ?  ?  Does patient have an order for bedrest or is patient  medically unstable No - Continue assessment No - Continue assessment No - Continue assessment    ? What is the highest level of mobility based on the progressive mobility assessment? Level 4 (Walks with assist in room) - Balance while marching in place and cannot step forward and back - Complete Level 4 (Walks with assist in room) - Balance while marching in place and cannot step forward and back - Complete Level 4 (Walks with assist in room) - Balance while marching in place and cannot step forward and back - Complete    ? ?  ?  ? ?  ?  ? ?Objective: ?Vitals last 24 hrs: ?Vitals:  ? 05/24/21 0640 05/24/21 1020 05/24/21 1023 05/24/21 1024  ?BP: 124/71 133/68 113/73 91/62  ?Pulse: 67 64 75 93  ?Resp: 18 20 18 20   ?Temp: 97.7 ?F (36.5 ?C) 98.2 ?F (36.8 ?C)    ?TempSrc: Oral Oral    ?SpO2: 100% 99% 100% 99%  ?Weight:      ?Height:      ? ?Weight change:  ? ?Physical Examination: ?General exam: AA oriented, pleasant,older than stated age, weak appearing. ?HEENT:Oral mucosa moist, Ear/Nose WNL grossly, dentition normal. ?Respiratory system: bilaterally diminished BS, no use of accessory muscle ?Cardiovascular system: S1 & S2 +, No JVD,. ?Gastrointestinal system: Abdomen soft,NT,ND, BS+ ?Nervous System:Alert, awake, moving extremities and grossly nonfocal ?Extremities: LE edema NONE,distal peripheral pulses palpable.  ?Skin: No rashes,no icterus. ?MSK: Normal muscle bulk,tone, power ? ?Medications reviewed:  ?Scheduled Meds: ? acyclovir  400 mg Oral BID  ? docusate sodium  100 mg Oral Q12H  ? donepezil  5 mg Oral QHS  ? DULoxetine  20 mg Oral Daily  ? enoxaparin (LOVENOX) injection  40 mg Subcutaneous Q24H  ? gabapentin  100 mg Oral BID  ? melatonin  10 mg Oral QHS  ? sodium chloride flush  3 mL Intravenous Q12H  ? ?Continuous Infusions: ? sodium chloride    ? sodium chloride    ? ? ?  ?Diet Order   ? ?       ?  Diet Heart Room service appropriate? Yes; Fluid consistency: Thin  Diet effective now       ?  ? ?  ?  ? ?  ?   ? ?  ?  ?  ? ? ?Intake/Output Summary (Last 24 hours) at 05/24/2021 1159 ?Last data filed at 05/24/2021 [phone] ?Gross per 24 hour  ?Intake 360 ml  ?Output --  ?Net 360 ml  ? ?Net IO Since Admission: 503 mL [05/24/21 1159]  ?Wt Readings from Last 3 Encounters:  ?05/24/21 50.7 kg  ?04/05/21 38.6 kg  ?04/03/21 41 kg  ?  ? ?Unresulted Labs (From admission, onward)  ? ?  Start     Ordered  ? 05/30/21 0500  Creatinine, serum  (enoxaparin (LOVENOX)    CrCl >/= 30 ml/min)  Weekly,   R     ?Comments: while on enoxaparin therapy ?  ? 05/23/21 1043  ? 05/23/21 0246  Urinalysis, Routine w reflex microscopic  Once,   STAT       ? 05/23/21 0245  ? ?  ?  ? ?  ?Data Reviewed: I have personally reviewed following labs and imaging studies ?CBC: ?Recent Labs  ?Lab 05/30/2021 ?1000 05/23/21 ?0323 05/23/21 ?1100  ?WBC 6.5 8.3 8.6  ?NEUTROABS 3.9 5.3  --   ?HGB 13.3 11.4* 11.6*  ?HCT 38.8  34.5* 34.8*  ?MCV 91.5 93.8 94.1  ?PLT 254 242 237  ? ?Basic Metabolic Panel: ?Recent Labs  ?Lab 05/21/21 ?1000 05/23/21 ?0323 05/23/21 ?1100  ?NA 137 136  --   ?K 5.1 3.7  --   ?CL 103 105  --   ?CO2 22 23  --   ?GLUCOSE 82 113*  --   ?BUN 19 16  --   ?CREATININE 1.04* 0.97 0.93  ?CALCIUM 10.7* 9.1  --   ? ?GFR: ?Estimated Creatinine Clearance: 37.3 mL/min (by C-G formula based on SCr of 0.93 mg/dL). ?Liver Function Tests: ?Recent Labs  ?Lab 05/21/21 ?1136 05/23/21 ?0323  ?AST 19 21  ?ALT 18 16  ?ALKPHOS 92 94  ?BILITOT 1.1 0.5  ?PROT 7.8 6.5  ?ALBUMIN 4.6 3.4*  ? ?No results for input(s): LIPASE, AMYLASE in the last 168 hours. ?No results for input(s): AMMONIA in the last 168 hours. ?Coagulation Profile: ?No results for input(s): INR, PROTIME in the last 168 hours. ?BNP (last 3 results) ?No results for input(s): PROBNP in the last 8760 hours. ?HbA1C: ?No results for input(s): HGBA1C in the last 72 hours. ?CBG: ?Recent Labs  ?Lab 05/23/21 ?0730 05/24/21 ?[id number]  ?GLUCAP 116* 89  ? ?Lipid Profile: ?No results for input(s): CHOL, HDL, LDLCALC, TRIG, CHOLHDL,  LDLDIRECT in the last 72 hours. ?Thyroid Function Tests: ?No results for input(s): TSH, T4TOTAL, FREET4, T3FREE, THYROIDAB in the last 72 hours. ?Sepsis Labs: ?No results for input(s): PROCALCITON, LATICACIDVEN

## 2021-05-24 NOTE — Progress Notes (Signed)
Carotid duplex bilateral study completed.   Please see CV Proc for preliminary results.   Daveah Varone, RDMS, RVT  

## 2021-05-24 NOTE — Evaluation (Signed)
Physical Therapy Evaluation ?Patient Details ?Name: Lynn Price ?MRN: [id number] ?DOB: 04-Apr-1940 ?Today's Date: 05/24/2021 ? ?History of Present Illness ? 81 y.o. female adm  with  back pain following a fall at home, multiple near syncopal episodes while in the emergency department.  Negative pelvic x-ray no acute fracture calcific tendinopathy of the hamstring muscle insertion; chest x-ray negative findings CTA showed no evidence of PE or other acute finding.  EKG nonischemic medical history significant of anxiety, fibromyalgia, GERD, hypertension, HSV unknown dental infection, IBS, neuropathy, memory loss, osteoarthritis, overactive bladder, restless leg syndrome, sleep apnea  ?Clinical Impression ? Pt admitted with above diagnosis.  ?PT with reported episode of orthostatic hypotension with nursing earlier. PT denies dizziness during PT session, is able to amb 160' with RW and min/guard to supervision for safety. Recommend HHPT at ALF as pt is weaker than her reported baseline.  ? Pt currently with functional limitations due to the deficits listed below (see PT Problem List). Pt will benefit from skilled PT to increase their independence and safety with mobility to allow discharge to the venue listed below.   ?   ?   ? ?Recommendations for follow up therapy are one component of a multi-disciplinary discharge planning process, led by the attending physician.  Recommendations may be updated based on patient status, additional functional criteria and insurance authorization. ? ?Follow Up Recommendations Home health PT (at ALF) ? ?  ?Assistance Recommended at Discharge    ?Patient can return home with the following ? Help with stairs or ramp for entrance ? ?  ?Equipment Recommendations Other (comment) (has rollator)  ?Recommendations for Other Services ?    ?  ?Functional Status Assessment Patient has had a recent decline in their functional status and demonstrates the ability to make significant improvements in  function in a reasonable and predictable amount of time.  ? ?  ?Precautions / Restrictions Precautions ?Precautions: Fall ?Restrictions ?Weight Bearing Restrictions: No  ? ?  ? ?Mobility ? Bed Mobility ?Overal bed mobility: Modified Independent ?  ?  ?  ?  ?  ?  ?  ?  ? ?Transfers ?Overall transfer level: Needs assistance ?Equipment used: Rolling walker (2 wheels) ?Transfers: Sit to/from Stand ?Sit to Stand: Min guard ?  ?  ?  ?  ?  ?General transfer comment: for safety, denies dizziness on standing ?  ? ?Ambulation/Gait ?Ambulation/Gait assistance: Min guard, Supervision ?Gait Distance (Feet): 160 Feet ?Assistive device: Rolling walker (2 wheels) ?Gait Pattern/deviations: Step-through pattern, Trunk flexed ?  ?  ?  ?General Gait Details: cues for RW management (pt is used to a  rollator), min/guard to supervision for safety especially with turns. unsteady however without  overt LOB; denied dizziness ? ?Stairs ?  ?  ?  ?  ?  ? ?Wheelchair Mobility ?  ? ?Modified Rankin (Stroke Patients Only) ?  ? ?  ? ?Balance Overall balance assessment: Needs assistance ?Sitting-balance support: Feet supported ?Sitting balance-Leahy Scale: Good ?  ?  ?  ?Standing balance-Leahy Scale: Fair ?Standing balance comment: reliant on device for dynamic tasks/at least unilateral UE support ?  ?  ?  ?  ?  ?  ?  ?  ?  ?  ?  ?   ? ? ? ?Pertinent Vitals/Pain    ? ? ?Home Living Family/patient expects to be discharged to:: Assisted living ?  ?  ?  ?  ?  ?  ?  ?  ?Home Equipment: Rollator (4 wheels) ?Additional  Comments: from [facility]  ?  ?Prior Function Prior Level of Function : Independent/Modified Independent ?  ?  ?  ?  ?  ?  ?Mobility Comments: pt reports amb with rollator, amb to DR for meals ?  ?  ? ? ?Hand Dominance  ?   ? ?  ?Extremity/Trunk Assessment  ? Upper Extremity Assessment ?Upper Extremity Assessment: Defer to OT evaluation ?  ? ?Lower Extremity Assessment ?Lower Extremity Assessment: Generalized weakness ?  ? ?    ?Communication  ? Communication: No difficulties  ?Cognition Arousal/Alertness: Awake/alert ?Behavior During Therapy: [facility] for tasks assessed/performed ?Overall Cognitive Status: Within Functional Limits for tasks assessed ?  ?  ?  ?  ?  ?  ?  ?  ?  ?  ?  ?  ?  ?  ?  ?  ?  ?  ?  ? ?  ?General Comments   ? ?  ?Exercises    ? ?Assessment/Plan  ?  ?PT Assessment Patient needs continued PT services  ?PT Problem List Decreased strength;Decreased activity tolerance;Decreased balance;Decreased mobility ? ?   ?  ?PT Treatment Interventions DME instruction;Therapeutic exercise;Gait training;Functional mobility training;Therapeutic activities;Patient/family education;Balance training   ? ?PT Goals (Current goals can be found in the Care Plan section)  ?Acute Rehab PT Goals ?Patient Stated Goal: get better ?PT Goal Formulation: With patient ?Time For Goal Achievement: 06/07/21 ?Potential to Achieve Goals: Good ? ?  ?Frequency Min 3X/week ?  ? ? ?Co-evaluation   ?  ?  ?  ?  ? ? ?  ?AM-PAC PT "6 Clicks" Mobility  ?Outcome Measure Help needed turning from your back to your side while in a flat bed without using bedrails?: None ?Help needed moving from lying on your back to sitting on the side of a flat bed without using bedrails?: None ?Help needed moving to and from a bed to a chair (including a wheelchair)?: A Little ?Help needed standing up from a chair using your arms (e.g., wheelchair or bedside chair)?: A Little ?Help needed to walk in hospital room?: A Little ?Help needed climbing 3-5 steps with a railing? : A Little ?6 Click Score: 20 ? ?  ?End of Session Equipment Utilized During Treatment: Gait belt ?Activity Tolerance: Patient tolerated treatment well ?Patient left: in bed;with call bell/phone within reach;with nursing/sitter in room ?Nurse Communication: Mobility status ?PT Visit Diagnosis: Other abnormalities of gait and mobility (R26.89);Difficulty in walking, not elsewhere classified (R26.2) ?  ? ?Time:  1130-1145 ?PT Time Calculation (min) (ACUTE ONLY): 15 min ? ? ?Charges:   PT Evaluation ?$PT Eval Low Complexity: 1 Low ?  ?  ?   ? ? ?Delice Bison, PT ? ?Acute Rehab Dept [facility]) [phone] ?Pager [phone] ? ?05/24/2021 ? ? ?Lynn Price ?05/24/2021, 12:29 PM ? ?

## 2021-05-24 NOTE — Hospital Course (Addendum)
I will12 y.o. female with medical history significant of anxiety, fibromyalgia, GERD, hypertension, HSV unknown dental infection, IBS, neuropathy, memory loss, underweight (used to have class I obesity), osteoarthritis, overactive bladder, restless leg syndrome, sleep apnea who came to the emergency department 2 days PTA [place] history of back pain following a fall at home. She has had multiple near syncopal episodes while in the emergency department. ?Work-up in the ED showed stable vital signs, fairly stable lab work 2.  Negative pelvic x-ray no acute fracture calcific tendinopathy of the hamstring muscle insertion chest x-ray negative findings CTA showed no evidence of PE or other acute finding, has aortic atherosclerosis.  EKG nonischemic ?She was admitted for evaluation of near syncope: Holding diltiazem, placed on telemetry, echocardiogram carotid duplex ordered and cardiology was consulted.  Echo showed EF 65 to 70%, G1 DD SF multiple mitral valve with mild MR. Carotid duplex was unremarkable. ?In the ED seen by psychiatry as well continue on Cymbalta for depression anxiety and pain along with Neurontin for mood/pain ?Patient orthostatic vitals were abnormal-patient IV fluid hydration.  Seen by psychiatry on one to one and need inpatient psychiatric admissions.  Patient was again orthostatic positive on 3/28, 3/29- but asymptomatic now, added compression stocking- 04/3128-orthostatic vitals are negative. Off ivf.Waiting for Geri psychiatric placement ?

## 2021-05-24 NOTE — Plan of Care (Signed)
Pt aox4, withdrawn and anxious.  ?SI precautions maintained, sitter at bedside.  ?Psych following.  ?Orthostatic BPs positive. ? ?Problem: Education: ?Goal: Knowledge of General Education information will improve ?Description: Including pain rating scale, medication(s)/side effects and non-pharmacologic comfort measures ?Outcome: Progressing ?  ?Problem: Health Behavior/Discharge Planning: ?Goal: Ability to manage health-related needs will improve ?Outcome: Progressing ?  ?Problem: Clinical Measurements: ?Goal: Ability to maintain clinical measurements within normal limits will improve ?Outcome: Progressing ?Goal: Will remain free from infection ?Outcome: Progressing ?Goal: Diagnostic test results will improve ?Outcome: Progressing ?Goal: Respiratory complications will improve ?Outcome: Progressing ?Goal: Cardiovascular complication will be avoided ?Outcome: Progressing ?  ?Problem: Activity: ?Goal: Risk for activity intolerance will decrease ?Outcome: Progressing ?  ?Problem: Nutrition: ?Goal: Adequate nutrition will be maintained ?Outcome: Progressing ?  ?Problem: Coping: ?Goal: Level of anxiety will decrease ?Outcome: Progressing ?  ?Problem: Elimination: ?Goal: Will not experience complications related to bowel motility ?Outcome: Progressing ?Goal: Will not experience complications related to urinary retention ?Outcome: Progressing ?  ?Problem: Pain Managment: ?Goal: General experience of comfort will improve ?Outcome: Progressing ?  ?Problem: Safety: ?Goal: Ability to remain free from injury will improve ?Outcome: Progressing ?  ?Problem: Skin Integrity: ?Goal: Risk for impaired skin integrity will decrease ?Outcome: Progressing ?  ?

## 2021-05-25 DIAGNOSIS — R55 Syncope and collapse: Secondary | ICD-10-CM

## 2021-05-25 MED ORDER — DULOXETINE HCL 20 MG PO CPEP: 20.0000 mg | ORAL_CAPSULE | Freq: Two times a day (BID) | ORAL | Status: DC

## 2021-05-25 MED ORDER — MIDODRINE HCL 2.5 MG PO TABS
2.5000 mg | ORAL_TABLET | Freq: Two times a day (BID) | ORAL | Status: DC
  Filled 2021-05-25: qty 1

## 2021-05-25 MED ORDER — DULOXETINE HCL 20 MG PO CPEP
20.0000 mg | ORAL_CAPSULE | Freq: Two times a day (BID) | ORAL | Status: DC
  Administered 2021-05-25 – 2021-05-27 (×5): 20 mg via ORAL
  Filled 2021-05-25 (×5): qty 1

## 2021-05-25 MED ORDER — SODIUM CHLORIDE 0.9 % IV BOLUS
500.0000 mL | Freq: Once | INTRAVENOUS | Status: AC
  Administered 2021-05-25: 500 mL via INTRAVENOUS

## 2021-05-25 NOTE — Consult Note (Signed)
[facility] Face-to-Face Psychiatry Consult  ? ?Reason for Consult:  Depression and suicidal ideations ?Referring Physician:  Dr. Lupita Leash ?Patient Identification: Lynn Price ?MRN:  [id number] ?Principal Diagnosis: Near syncope ?Diagnosis:  Principal Problem: ?  Near syncope ?Active Problems: ?  Memory loss ?  Essential hypertension, benign ?  Depression, major, single episode, severe ([place]) ?  Gastroesophageal reflux disease with esophagitis without hemorrhage ?  Pure hypercholesterolemia ?  Peripheral neuropathy ?  Chronic kidney disease, stage 3 unspecified ([place]) ?  Mild protein malnutrition ([place]) ?  Aortic calcification (HCC) ?  Orthostatic hypotension ? ? ?Total Time spent with patient: 45 minutes ? ?Subjective:   ?Lynn Price is a 81 y.o. female patient admitted with pain, orthostasis and parasuicidal statements. She endorses pain related to neuropathic pain, worsening depression, and grief. On today's re-evaluation she continues to endorse parasuicidal statements and ideations during the day. She continues to present withdrawn, flat. Her mood is sad, and her affect is dysphoric and congruent. She is very soft spoken, and appears future forward thinking. She is motivated to seek help inpatient behavioral health, so that she can return to her "daily living at [facility]." She also inquires about her bed there, and requests to notify the facility of her return after she seeks inpatient psychiatric care. She continues to endorse suicidal ideations, and is able to contract for safety. She remains on 1:1 safety sitter at this time. ? ?HPI:  81 years old female seen this morning waiting for hospitalization in a Gero/Psych unit for treatment of depression and anxiety.  Patient spoke in a soft slow tone this morning.  She reported feeling depressed, sad and hopeless.  She reported her trigger to be the loss of husband, inability to do things she used to do for herself and depending on other people to  take care of her.  Patient is a resident of [facility] in [place].  She used to live in the same with her husband until he passed on and then she was moved to a different section in the community.  Patient feels anxious mainly due to her declining health condition.  She reported that she felt one time that life is not worth living but added she does not want to die because she is expecting a grandson in October.  Patient vent on to state that she want to live and se, touch and feel her new grandson when the time comes.  She reported broken 3-5 hours sleep at night.  She reported fairly good appetite.  She admitted she is still grieving her husband's passing and agreed today to engage in counseling.  She denied SI/HI/AVH and no mention of Paranoia.  Cymbalta 20 mg started for pain and depression. ? ?Past Psychiatric History: Denies ? ?Risk to Self:   Yes ?Risk to Others:   No ?Prior Inpatient Therapy:   Denies ?Prior Outpatient Therapy:   Denies ? ?Past Medical History:  ?Past Medical History:  ?Diagnosis Date  ? Anxiety   ? Fibromyalgia   ? GERD (gastroesophageal reflux disease)   ? High blood pressure   ? HSV (herpes simplex virus) anogenital infection   ? IBS (irritable bowel syndrome)   ? Memory loss   ? Neuropathy   ? Obesity (BMI 30-39.9) 04/01/2014  ? Osteoarthritis   ? Overactive bladder   ? Restless leg syndrome   ? Sleep apnea   ?  ?Past Surgical History:  ?Procedure Laterality Date  ? APPENDECTOMY    ?  CATARACT EXTRACTION Left   ? CESAREAN SECTION    ? CHOLECYSTECTOMY    ? colonscopy    ? MULTIPLE TOOTH EXTRACTIONS    ? ROTATOR CUFF REPAIR Left   ? TOTAL KNEE ARTHROPLASTY    ? ?Family History:  ?Family History  ?Problem Relation Age of Onset  ? Depression Mother   ? Heart failure Mother   ? Depression Father   ? Heart attack Father   ? Mental illness Sister   ? Mental illness Sister   ? ?Family Psychiatric  History: See HPI ?Social History:  ?Social History  ? ?Substance and Sexual  Activity  ?Alcohol Use Not Currently  ? Comment: wine occas  ?   ?Social History  ? ?Substance and Sexual Activity  ?Drug Use No  ?  ?Social History  ? ?Socioeconomic History  ? Marital status: Widowed  ?  Spouse name: Lynn Price  ? Number of children: 2  ? Years of education: college  ? Highest education level: Associate degree: academic program  ?Occupational History  ?  Comment: retired  ?Tobacco Use  ? Smoking status: Never  ? Smokeless tobacco: Never  ?Vaping Use  ? Vaping Use: Never used  ?Substance and Sexual Activity  ? Alcohol use: Not Currently  ?  Comment: wine occas  ? Drug use: No  ? Sexual activity: Not on file  ?Other Topics Concern  ? Not on file  ?Social History Narrative  ? Patient passed away 2019/11/27, and she is retired. Patient has 2 years of college. Patient has 2 children.  ? 12/09/20 lives at [facility], independent living    ? ?Social Determinants of Health  ? ?Financial Resource Strain: Not on file  ?Food Insecurity: Not on file  ?Transportation Needs: Not on file  ?Physical Activity: Not on file  ?Stress: Not on file  ?Social Connections: Not on file  ? ?Additional Social History: ?  ? ?Allergies:   ?Allergies  ?Allergen Reactions  ? Erythromycin Rash  ? Penicillins Rash and Other (See Comments)  ?  Remote reaction, no medical attention required ?No cutaneous or anaphylactic symptoms ?Tolerates Keflex  ? Sulfa Antibiotics Rash  ? Tetracyclines & Related Rash  ? ? ?Labs:  ?Results for orders placed or performed during the hospital encounter of 05/21/21 (from the past 48 hour(s))  ?Troponin I (High Sensitivity)     Status: None  ? Collection Time: 05/23/21 11:00 AM  ?Result Value Ref Range  ? Troponin I (High Sensitivity) 3 <18 ng/L  ?  Comment: (NOTE) ?Elevated high sensitivity troponin I (hsTnI) values and significant  ?changes across serial measurements may suggest ACS but many other  ?chronic and acute conditions are known to elevate hsTnI results.  ?Refer to the "Links" section for chest  pain algorithms and additional  ?guidance. ?Performed at [facility], [address] ?[place], [place] [address] ?  ?CBC     Status: Abnormal  ? Collection Time: 05/23/21 11:00 AM  ?Result Value Ref Range  ? WBC 8.6 4.0 - 10.5 K/uL  ? RBC 3.70 (L) 3.87 - 5.11 MIL/uL  ? Hemoglobin 11.6 (L) 12.0 - 15.0 g/dL  ? HCT 34.8 (L) 36.0 - 46.0 %  ? MCV 94.1 80.0 - 100.0 fL  ? MCH 31.4 26.0 - 34.0 pg  ? MCHC 33.3 30.0 - 36.0 g/dL  ? RDW 13.5 11.5 - 15.5 %  ? Platelets 237 150 - 400 K/uL  ? nRBC 0.0 0.0 - 0.2 %  ?  Comment: Performed at [facility], [place] [address] [place], [place] [address]  ?Creatinine, serum     Status: None  ? Collection Time: 05/23/21 11:00 AM  ?Result Value Ref Range  ? Creatinine, Ser 0.93 0.44 - 1.00 mg/dL  ? GFR, Estimated >60 >60 mL/min  ?  Comment: (NOTE) ?Calculated using the CKD-EPI Creatinine Equation (2021) ?Performed at [facility], [address] ?[place], [place] [address] ?  ?Glucose, capillary     Status: None  ? Collection Time: 05/24/21  6:38 AM  ?Result Value Ref Range  ? Glucose-Capillary 89 70 - 99 mg/dL  ?  Comment: Glucose reference range applies only to samples taken after fasting for at least 8 hours.  ? ? ?Current Facility-Administered Medications  ?Medication Dose Route Frequency Provider Last Rate Last Admin  ? 0.9 %  sodium chloride infusion   Intravenous Continuous Kc, Ramesh, MD 50 mL/hr at 05/24/21 2125 New Bag at 05/24/21 2125  ? acetaminophen (TYLENOL) tablet 325 mg  325 mg Oral Q6H PRN Reubin Milan, MD   325 mg at 05/25/21 0426  ? Or  ? acetaminophen (TYLENOL) suppository 325 mg  325 mg Rectal Q6H PRN Reubin Milan, MD      ? acyclovir Maury Dus) tablet 400 mg  400 mg Oral BID Reubin Milan, MD   400 mg at 05/24/21 2126  ? docusate sodium (COLACE) capsule 100 mg  100 mg Oral Q12H Reubin Milan, MD   100 mg at 05/24/21 2125  ? donepezil (ARICEPT) tablet 5 mg  5 mg Oral QHS Reubin Milan, MD   5 mg at 05/24/21 2125  ? DULoxetine (CYMBALTA) DR capsule 20 mg  20 mg Oral Daily Reubin Milan, MD   20 mg at 05/24/21 [id number]  ? enoxaparin (LOVENOX) injection 40 mg  40 mg Subcutaneous Q24H Ph

## 2021-05-25 NOTE — TOC Progression Note (Signed)
Transition of Care (TOC) - Progression Note  ? ? ?Patient Details  ?Name: Lynn Price ?MRN: [id number] ?Date of Birth: 11-Sep-1940 ? ?Transition of Care (TOC) CM/SW Contact  ?Ross Ludwig, LCSW ?Phone Number: ?05/25/2021, 6:36 PM ? ?Clinical Narrative:    ? ?Patient is from [place].  Psych saw patient and is recommending Geropsych placement.  Patient and family are agreeable to voluntary commitment to [facility] if bed is available.   ? ?CSW has faxed out to: ?[facility] ?[facility] ?[place] ?[place] ?[place] ?[facility] ?[facility] ?[facility] ?[facility] ?[facility] ?[facility] ?[place] ?[facility] ?[place] ?[place] ? ?Waiting for respons back. ? ? ? ?  ?  ? ?Expected Discharge Plan and Services ?  ?  ?  ?  ?  ?                ?  ?  ?  ?  ?  ?  ?  ?  ?  ?  ? ? ?Social Determinants of Health (SDOH) Interventions ?Depression Interventions/Treatment : Referral to Psychiatry, Medication, Counseling ? ?Readmission Risk Interventions ?   ? View : No data to display.  ?  ?  ?  ? ? ?

## 2021-05-25 NOTE — Progress Notes (Signed)
?PROGRESS NOTE ?Lynn Price  [id number] DOB: 12-Dec-1940 DOA: 05/21/2021 ?PCP: Gaynelle Arabian, MD  ? ?Brief Narrative/Hospital Course: ?I will53 y.o. female with medical history significant of anxiety, fibromyalgia, GERD, hypertension, HSV unknown dental infection, IBS, neuropathy, memory loss, underweight (used to have class I obesity), osteoarthritis, overactive bladder, restless leg syndrome, sleep apnea who came to the emergency department [place] of back pain following a fall at home. She has had multiple near syncopal episodes while in the emergency department. ?Work-up in the ED showed stable vital signs, fairly stable lab work 2.  Negative pelvic x-ray no acute fracture calcific tendinopathy of the hamstring muscle insertion chest x-ray negative findings CTA showed no evidence of PE or other acute finding, has aortic atherosclerosis.  EKG nonischemic ?She was admitted for evaluation of near syncope: Holding diltiazem, placed on telemetry, echocardiogram carotid duplex ordered and cardiology was consulted.  Echo showed EF 65 to 70%, G1 DD SF multiple mitral valve with mild MR. Carotid duplex was unremarkable. ?In the ED seen by psychiatry as well continue on Cymbalta for depression anxiety and pain along with Neurontin for mood/pain ?Patient orthostatic vitals were abnormal-patient IV fluid hydration.  Seen by psychiatry on 1 June. ? ?Subjective: ?Patient was asking about her Ativan.  Denies any nausea, vomiting, chest pain, shortness of breath, fever, chills, headache, focal weakness, numbness tingling, speech difficulties  ?Orthostatic vitals abdominal again this morning IV fluid bolus ordered along with midodrine low dose trial. ? ?Assessment and Plan: ?Principal Problem: ?  Near syncope ?Active Problems: ?  Memory loss ?  Essential hypertension, benign ?  Depression, major, single episode, severe ([place]) ?  Gastroesophageal reflux disease with esophagitis without hemorrhage ?  Pure  hypercholesterolemia ?  Peripheral neuropathy ?  Chronic kidney disease, stage 3 unspecified ([place]) ?  Mild protein malnutrition ([place]) ?  Aortic calcification (HCC) ?  Orthostatic hypotension ?  ?Near syncope ?Orthostatic hypotension: ?This morning orthostatic again, will give bolus 500 cc, keep on gentle IV fluid, if again positive- will add low-dose midodrine ( need to monitor for supine hypertension).Continue to hold diltiazem.  Echocardiogram unremarkable, carotid duplex no acute finding.  Appreciate cardiology input -no arrhythmia noted in the second episode of near syncopal episode in the ED. during lead PT OT. ? ?Memory loss ?Depression, major, single episode, severe  ?Suicidal ideation ?: ?Alert awake interacting appropriately, psych following closely and need geriatric psychiatric admission.   ?Continue current meds, including Cymbalta and lorazepam donezepil.  Further plan per psychiatry. ? ?GERD: Continue PPI ? ?Essential hypertension, benign: Orthostatic hypotension so continue on IV fluids and start Cardizem.See #1 ? ?Pure hypercholesterolemia: Not on any therapy.  Check a fasting lipid panel. follow-up with PCP ? ?Peripheral neuropathy: Continue Neurontin ? ?Chronic kidney disease, stage 3a: Stable at baseline ?Recent Labs  ?Lab 05/21/21 ?1000 05/23/21 ?0323 05/23/21 ?1100  ?BUN 19 16  --   ?CREATININE 1.04* 0.97 0.93  ? ?  ?Mild protein malnutrition : Consult dietitian ?Aortic calcification: Incidentally noted, outpatient cardiology follow-up ?Low Body mass index is 18.09 kg/m?[place]  Consult dietitian augment nutritional status. ?DVT prophylaxis: enoxaparin (LOVENOX) injection 40 mg Start: 05/24/21 2200 ?Code Status:   Code Status: Full Code ?Family Communication: plan of care discussed with patient at bedside. ?Patient status is: Inpatient , Telemetry  ?Patient currently not stable ? ?Dispo: The patient is from: home ?           Anticipated disposition: Inpatient geriatric psychiatric facility   ? ?Mobility  Assessment (last 72 hours)   ? ? Mobility Assessment   ? ? [place] Name 05/24/21 2123 05/24/21 1420 05/24/21 1226 05/24/21 0845 05/23/21 2119  ? Does patient have an order for bedrest or is patient medically unstable No - Continue assessment -- -- No - Continue assessment No - Continue assessment  ? What is the highest level of mobility based on the progressive mobility assessment? Level 5 (Walks with assist in room/hall) - Balance while stepping forward/back and can walk in room with assist - Complete Level 5 (Walks with assist in room/hall) - Balance while stepping forward/back and can walk in room with assist - Complete Level 5 (Walks with assist in room/hall) - Balance while stepping forward/back and can walk in room with assist - Complete Level 4 (Walks with assist in room) - Balance while marching in place and cannot step forward and back - Complete Level 4 (Walks with assist in room) - Balance while marching in place and cannot step forward and back - Complete  ? ? [place] Name 05/23/21 1500  ?  ?  ?  ?  ? Does patient have an order for bedrest or is patient medically unstable No - Continue assessment      ? What is the highest level of mobility based on the progressive mobility assessment? Level 4 (Walks with assist in room) - Balance while marching in place and cannot step forward and back - Complete      ? ?  ?  ? ?  ?  ? ?Objective: ?Vitals last 24 hrs: ?Vitals:  ? 05/24/21 1024 05/24/21 2025 05/25/21 0411 05/25/21 0423  ?BP: 91/62 131/71 131/75   ?Pulse: 93 70 68   ?Resp: 20 16 16    ?Temp:  97.7 ?F (36.5 ?C) 98.1 ?F (36.7 ?C)   ?TempSrc:  Oral Oral   ?SpO2: 99% 100% 99%   ?Weight:    52.4 kg  ?Height:      ? ?Weight change: 1.4 kg ? ?Physical Examination: ?General exam: AA0X3,older than stated age, weak appearing. ?HEENT:Oral mucosa moist, Ear/Nose WNL grossly, dentition normal. ?Respiratory system: bilaterally diminished,no use of accessory muscle ?Cardiovascular system: S1 & S2 +, No  JVD,. ?Gastrointestinal system: Abdomen soft,NT,ND, BS+ ?Nervous System:Alert, awake, moving extremities and grossly nonfocal ?Extremities: edema neg,distal peripheral pulses palpable.  ?Skin: No rashes,no icterus. ?MSK: Normal muscle bulk,tone, power ? ? ?Medications reviewed:  ?Scheduled Meds: ? acyclovir  400 mg Oral BID  ? docusate sodium  100 mg Oral Q12H  ? donepezil  5 mg Oral QHS  ? DULoxetine  20 mg Oral BID  ? enoxaparin (LOVENOX) injection  40 mg Subcutaneous Q24H  ? gabapentin  100 mg Oral BID  ? melatonin  10 mg Oral QHS  ? omega-3 acid ethyl esters  1 g Oral Daily  ? polyethylene glycol  17 g Oral Daily  ? sodium chloride flush  3 mL Intravenous Q12H  ? ?Continuous Infusions: ? sodium chloride 50 mL/hr at 05/24/21 2125  ? ? ?  ?Diet Order   ? ?       ?  Diet Heart Room service appropriate? Yes; Fluid consistency: Thin  Diet effective now       ?  ? ?  ?  ? ?  ?  ? ?  ?  ?  ? ? ?Intake/Output Summary (Last 24 hours) at 05/25/2021 1235 ?Last data filed at 05/25/2021 0559 ?Gross per 24 hour  ?Intake 521.79 ml  ?Output --  ?Net 521.79 ml  ? ? ?  Net IO Since Admission: 1,024.79 mL [05/25/21 1235]  ?Wt Readings from Last 3 Encounters:  ?05/25/21 52.4 kg  ?04/05/21 38.6 kg  ?04/03/21 41 kg  ?  ? ?Unresulted Labs (From admission, onward)  ? ?  Start     Ordered  ? 05/30/21 0500  Creatinine, serum  (enoxaparin (LOVENOX)    CrCl >/= 30 ml/min)  Weekly,   R     ?Comments: while on enoxaparin therapy ?  ? 05/23/21 1043  ? 05/23/21 0246  Urinalysis, Routine w reflex microscopic  Once,   STAT       ? 05/23/21 0245  ? ?  ?  ? ?  ?Data Reviewed: I have personally reviewed following labs and imaging studies ?CBC: ?Recent Labs  ?Lab 06-11-21 ?1000 05/23/21 ?0323 05/23/21 ?1100  ?WBC 6.5 8.3 8.6  ?NEUTROABS 3.9 5.3  --   ?HGB 13.3 11.4* 11.6*  ?HCT 38.8 34.5* 34.8*  ?MCV 91.5 93.8 94.1  ?PLT 254 242 237  ? ? ?Basic Metabolic Panel: ?Recent Labs  ?Lab 06-11-21 ?1000 05/23/21 ?0323 05/23/21 ?1100  ?NA 137 136  --   ?K 5.1 3.7   --   ?CL 103 105  --   ?CO2 22 23  --   ?GLUCOSE 82 113*  --   ?BUN 19 16  --   ?CREATININE 1.04* 0.97 0.93  ?CALCIUM 10.7* 9.1  --   ? ? ?GFR: ?Estimated Creatinine Clearance: 38.6 mL/min (by C-G formula based on SCr of 0.93

## 2021-05-26 LAB — GLUCOSE, CAPILLARY
Glucose-Capillary: 86 mg/dL (ref 70–99)
Glucose-Capillary: 105 mg/dL — ABNORMAL HIGH (ref 70–99)

## 2021-05-26 NOTE — Plan of Care (Signed)
Pt Aox4, cooperative, tearful, and sad.  ?1:1 Safety Sitter in place. Psych following.  ? ?Problem: Education: ?Goal: Knowledge of General Education information will improve ?Description: Including pain rating scale, medication(s)/side effects and non-pharmacologic comfort measures ?Outcome: Progressing ?  ?Problem: Health Behavior/Discharge Planning: ?Goal: Ability to manage health-related needs will improve ?Outcome: Progressing ?  ?Problem: Clinical Measurements: ?Goal: Ability to maintain clinical measurements within normal limits will improve ?Outcome: Progressing ?Goal: Will remain free from infection ?Outcome: Progressing ?Goal: Diagnostic test results will improve ?Outcome: Progressing ?Goal: Respiratory complications will improve ?Outcome: Progressing ?Goal: Cardiovascular complication will be avoided ?Outcome: Progressing ?  ?Problem: Activity: ?Goal: Risk for activity intolerance will decrease ?Outcome: Progressing ?  ?Problem: Nutrition: ?Goal: Adequate nutrition will be maintained ?Outcome: Progressing ?  ?Problem: Coping: ?Goal: Level of anxiety will decrease ?Outcome: Progressing ?  ?Problem: Elimination: ?Goal: Will not experience complications related to bowel motility ?Outcome: Progressing ?Goal: Will not experience complications related to urinary retention ?Outcome: Progressing ?  ?Problem: Pain Managment: ?Goal: General experience of comfort will improve ?Outcome: Progressing ?  ?Problem: Safety: ?Goal: Ability to remain free from injury will improve ?Outcome: Progressing ?  ?Problem: Skin Integrity: ?Goal: Risk for impaired skin integrity will decrease ?Outcome: Progressing ?  ?

## 2021-05-26 NOTE — Progress Notes (Signed)
?PROGRESS NOTE ?Lynn Price  [id number] DOB: 1941/02/20 DOA: 05/21/2021 ?PCP: Gaynelle Arabian, MD  ? ?Brief Narrative/Hospital Course: ?I will2 y.o. female with medical history significant of anxiety, fibromyalgia, GERD, hypertension, HSV unknown dental infection, IBS, neuropathy, memory loss, underweight (used to have class I obesity), osteoarthritis, overactive bladder, restless leg syndrome, sleep apnea who came to the emergency department 2 days PTA [place] history of back pain following a fall at home. She has had multiple near syncopal episodes while in the emergency department. ?Work-up in the ED showed stable vital signs, fairly stable lab work 2.  Negative pelvic x-ray no acute fracture calcific tendinopathy of the hamstring muscle insertion chest x-ray negative findings CTA showed no evidence of PE or other acute finding, has aortic atherosclerosis.  EKG nonischemic ?She was admitted for evaluation of near syncope: Holding diltiazem, placed on telemetry, echocardiogram carotid duplex ordered and cardiology was consulted.  Echo showed EF 65 to 70%, G1 DD SF multiple mitral valve with mild MR. Carotid duplex was unremarkable. ?In the ED seen by psychiatry as well continue on Cymbalta for depression anxiety and pain along with Neurontin for mood/pain ?Patient orthostatic vitals were abnormal-patient IV fluid hydration.  Seen by psychiatry on one to one and need inpatient psychiatric admissions.  Patient was again orthostatic positive on 3/28 given IV fluid bolus ? ?Subjective: ?Seen and examined this morning. ?Has been up and walking not dizzy although orthostatic was positive standing BP 95/71 from sitting 137/80 and lying 115/76 ?Overnight patient is afebrile blood pressure stable saturating well on room air. ?  ?Assessment and Plan: ?Principal Problem: ?  Near syncope ?Active Problems: ?  Memory loss ?  Essential hypertension, benign ?  Depression, major, single episode, severe ([place]) ?  Gastroesophageal  reflux disease with esophagitis without hemorrhage ?  Pure hypercholesterolemia ?  Peripheral neuropathy ?  Chronic kidney disease, stage 3 unspecified ([place]) ?  Mild protein malnutrition ([place]) ?  Aortic calcification ([facility]) ?  Orthostatic hypotension ?  ?Near syncope ?Orthostatic hypotension: ?Received IV boluses multiple times.  Still positive with orthostatics, continue and IV fluid hydration.  Add compression stocking, hold off on midodrine since patient does have supine hypertension.Off Cardizem.  Echocardiogram unremarkable, carotid duplex no acute finding.  Appreciate cardiology input -no arrhythmia noted in the second episode of near syncopal episode in the ED. during lead PT OT. ? ?Memory loss ?Depression, major, single episode, severe  ?Suicidal ideation ?: ?Mentating well.  Psych following closely and has recommended inpatient geriatric psychiatric admission.   ?Continue current meds, including Cymbalta and lorazepam donezepil.  Further plan per psychiatry. ? ?GERD: Continue PPI ? ?Essential hypertension, benign: Orthostatic hypotension so Cardizem has been discontinued.   ? ?Pure hypercholesterolemia: Not on any therapy.follow-up with PCP ? ?Peripheral neuropathy: Continue Neurontin ? ?Chronic kidney disease, stage 3a: Stable at baseline ?Recent Labs  ?Lab 05/21/21 ?1000 05/23/21 ?0323 05/23/21 ?1100  ?BUN 19 16  --   ?CREATININE 1.04* 0.97 0.93  ?  ?Mild protein malnutrition : Consult dietitian ?Aortic calcification: Incidentally noted, outpatient cardiology follow-up ?Low Body mass index is 18.09 kg/m?Marland Kitchen  Consult dietitian augment nutritional status. ?DVT prophylaxis: enoxaparin (LOVENOX) injection 40 mg Start: 05/24/21 2200 ?Code Status:   Code Status: Full Code ?Family Communication: plan of care discussed with patient at bedside. ?Patient status is: Inpatient , Telemetry  ?Patient currently not stable ? ?Dispo: The patient is from: home ?           Anticipated disposition: Inpatient geriatric  psychiatric  facility.  Start looking for a bed patient does have orthostatic dialysis but asymptomatic at this time we are adjusting her treatment plan but ONCE we get inpatient psychiatric facility should be able to dc her ? ?Mobility Assessment (last 72 hours)   ? ? Mobility Assessment   ? ? [place] Name 05/25/21 2100 05/24/21 2123 05/24/21 1420 05/24/21 1226 05/24/21 0845  ? Does patient have an order for bedrest or is patient medically unstable No - Continue assessment No - Continue assessment -- -- No - Continue assessment  ? What is the highest level of mobility based on the progressive mobility assessment? Level 5 (Walks with assist in room/hall) - Balance while stepping forward/back and can walk in room with assist - Complete Level 5 (Walks with assist in room/hall) - Balance while stepping forward/back and can walk in room with assist - Complete Level 5 (Walks with assist in room/hall) - Balance while stepping forward/back and can walk in room with assist - Complete Level 5 (Walks with assist in room/hall) - Balance while stepping forward/back and can walk in room with assist - Complete Level 4 (Walks with assist in room) - Balance while marching in place and cannot step forward and back - Complete  ? ? [place] Name 05/23/21 2119 05/23/21 1500  ?  ?  ?  ? Does patient have an order for bedrest or is patient medically unstable No - Continue assessment No - Continue assessment     ? What is the highest level of mobility based on the progressive mobility assessment? Level 4 (Walks with assist in room) - Balance while marching in place and cannot step forward and back - Complete Level 4 (Walks with assist in room) - Balance while marching in place and cannot step forward and back - Complete     ? ?  ?  ? ?  ?  ? ?Objective: ?Vitals last 24 hrs: ?Vitals:  ? 05/25/21 1200 05/25/21 1300 05/25/21 2020 05/26/21 0642  ?BP:  132/76 140/85 123/67  ?Pulse:  73 80 70  ?Resp: 19 18 16 16   ?Temp:  98.3 ?F (36.8 ?C) 98 ?F (36.7 ?C)  (!) 97.5 ?F (36.4 ?C)  ?TempSrc:  Oral Oral Oral  ?SpO2:  100% 100% 99%  ?Weight:      ?Height:      ? ?Weight change:  ? ?Physical Examination: ?General exam: AA0X3, ELDERLY,older than stated age, weak appearing. ?HEENT:Oral mucosa moist, Ear/Nose WNL grossly, dentition normal. ?Respiratory system: bilaterally diminished,no use of accessory muscle ?Cardiovascular system: S1 & S2 +, No JVD,. ?Gastrointestinal system: Abdomen soft,NT,ND, BS+ ?Nervous System:Alert, awake, moving extremities and grossly nonfocal ?Extremities: edema neg,distal peripheral pulses palpable.  ?Skin: No rashes,no icterus. ?MSK: Normal muscle bulk,tone, power ? ?Medications reviewed:  ?Scheduled Meds: ? acyclovir  400 mg Oral BID  ? docusate sodium  100 mg Oral Q12H  ? donepezil  5 mg Oral QHS  ? DULoxetine  20 mg Oral BID  ? enoxaparin (LOVENOX) injection  40 mg Subcutaneous Q24H  ? gabapentin  100 mg Oral BID  ? melatonin  10 mg Oral QHS  ? omega-3 acid ethyl esters  1 g Oral Daily  ? polyethylene glycol  17 g Oral Daily  ? sodium chloride flush  3 mL Intravenous Q12H  ? ?Continuous Infusions: ? sodium chloride 50 mL/hr at 05/24/21 2125  ? ? ?  ?Diet Order   ? ?       ?  Diet Heart Room service appropriate? Yes;  Fluid consistency: Thin  Diet effective now       ?  ? ?  ?  ? ?  ?  ? ?  ?  ?  ? ? ?Intake/Output Summary (Last 24 hours) at 05/26/2021 [id number] ?Last data filed at 05/25/2021 2009 ?Gross per 24 hour  ?Intake 240 ml  ?Output --  ?Net 240 ml  ? ?Net IO Since Admission: 1,264.79 mL [05/26/21 0712]  ?Wt Readings from Last 3 Encounters:  ?05/25/21 52.4 kg  ?04/05/21 38.6 kg  ?04/03/21 41 kg  ?  ? ?Unresulted Labs (From admission, onward)  ? ?  Start     Ordered  ? 05/30/21 0500  Creatinine, serum  (enoxaparin (LOVENOX)    CrCl >/= 30 ml/min)  Weekly,   R     ?Comments: while on enoxaparin therapy ?  ? 05/23/21 1043  ? 05/23/21 0246  Urinalysis, Routine w reflex microscopic  Once,   STAT       ? 05/23/21 0245  ? ?  ?  ? ?  ?Data Reviewed: I  have personally reviewed following labs and imaging studies ?CBC: ?Recent Labs  ?Lab 2021-05-24 ?1000 05/23/21 ?0323 05/23/21 ?1100  ?WBC 6.5 8.3 8.6  ?NEUTROABS 3.9 5.3  --   ?HGB 13.3 11.4* 11.6*  ?HCT 38.8 3

## 2021-05-26 NOTE — TOC Initial Note (Signed)
Transition of Care (TOC) - Initial/Assessment Note  ? ? ?Patient Details  ?Name: Lynn Price ?MRN: [id number] ?Date of Birth: 10-Mar-1940 ? ?Transition of Care (TOC) CM/SW Contact:    ?Trish Mage, LCSW ?Phone Number: ?05/26/2021, 9:23 AM ? ?Clinical Narrative:   [facility] and requested they review for possible admission. TOC will continue to follow during the course of hospitalization. ?              ? ? ?  ?Barriers to Discharge: Other (must enter comment) (pending acceptance) ? ? ?Patient Goals and CMS Choice ?  ?  ?  ? ?Expected Discharge Plan and Services ?  ?  ?  ?  ?  ?                ?  ?  ?  ?  ?  ?  ?  ?  ?  ?  ? ?Prior Living Arrangements/Services ?  ?  ?  ?       ?  ?  ?  ?  ? ?Activities of Daily Living ?Home Assistive Devices/Equipment: Gilford Rile (specify type) ?ADL Screening (condition at time of admission) ?Patient's cognitive ability adequate to safely complete daily activities?: No ?Is the patient deaf or have difficulty hearing?: No ?Does the patient have difficulty seeing, even when wearing glasses/contacts?: No ?Does the patient have difficulty concentrating, remembering, or making decisions?: Yes ?Patient able to express need for assistance with ADLs?: Yes ?Does the patient have difficulty dressing or bathing?: No ?Independently performs ADLs?: Yes (appropriate for developmental age) ?Does the patient have difficulty walking or climbing stairs?: No ?Weakness of Legs: None ?Weakness of Arms/Hands: None ? ?Permission Sought/Granted ?  ?  ?   ?   ?   ?   ? ?Emotional Assessment ?  ?  ?  ?  ?  ?  ? ?Admission diagnosis:  Suicidal thoughts [R45.851] ?Buttock pain [M79.18] ?Near syncope [R55] ?Chest pain, unspecified type [R07.9] ?Orthostatic hypotension [I95.1] ?Patient Active Problem List  ? Diagnosis Date Noted  ? Orthostatic hypotension 05/24/2021  ? Near syncope 05/23/2021  ? Gastroesophageal reflux disease with esophagitis without hemorrhage 05/23/2021  ? Pure hypercholesterolemia  05/23/2021  ? Peripheral neuropathy 05/23/2021  ? Chronic kidney disease, stage 3 unspecified ([place]) 05/23/2021  ? Diverticulosis of colon 05/23/2021  ? Adjustment disorder with depressed mood 05/23/2021  ? Anxiety 05/23/2021  ? Mild protein malnutrition ([place]) 05/23/2021  ? Aortic calcification ([place]) 05/23/2021  ? Depression, major, single episode, severe ([place]) 05/22/2021  ? Nausea & vomiting 12/16/2017  ? Diarrhea 12/16/2017  ? Hypokalemia 12/16/2017  ? Gait abnormality 08/30/2017  ? Lumbar radiculopathy 08/30/2017  ? Chronic left-sided low back pain with left-sided sciatica 07/21/2017  ? Personal history of calcium pyrophosphate deposition disease (CPPD) 07/29/2016  ? History of peripheral neuropathy 07/29/2016  ? History of total knee replacement, right 07/22/2016  ? Pseudogout 01/22/2016  ? Primary osteoarthritis of both knees 01/22/2016  ? Primary osteoarthritis of both hands 01/22/2016  ? Primary osteoarthritis of both feet 01/22/2016  ? Fibromyalgia 01/22/2016  ? Primary insomnia 01/22/2016  ? Obesity (BMI 30-39.9) 04/01/2014  ? Obstructive sleep apnea 04/19/2013  ? Essential hypertension, benign 04/19/2013  ? Memory loss 08/02/2012  ? ?PCP:  Gaynelle Arabian, MD ?Pharmacy:  No Pharmacies Listed ? ? ? ?Social Determinants of Health (SDOH) Interventions ?Depression Interventions/Treatment : Referral to Psychiatry, Medication, Counseling ? ?Readmission Risk Interventions ?   ? View : No data to display.  ?  ?  ?  ? ? ? ?

## 2021-05-26 NOTE — Progress Notes (Addendum)
Physical Therapy Treatment ?Patient Details ?Name: Lynn Price ?MRN: [id number] ?DOB: 26-Jul-1940 ?Today's Date: 05/26/2021 ? ? ?History of Present Illness 81 y.o. female adm  with  back pain following a fall at home, multiple near syncopal episodes while in the emergency department.  Negative pelvic x-ray no acute fracture calcific tendinopathy of the hamstring muscle insertion; chest x-ray negative findings CTA showed no evidence of PE or other acute finding.  EKG nonischemic medical history significant of anxiety, fibromyalgia, GERD, hypertension, HSV unknown dental infection, IBS, neuropathy, memory loss, osteoarthritis, overactive bladder, restless leg syndrome, sleep apnea ? ?  ?PT Comments  ? ? Pt in bathroom with Grandson at bedside and safety sitter in room. ?Assisted with amb a great distance in hallway with walker.  Trial amb without noted increased gait instability.  Pt has been using a Rollator prior to admit. ? ?  ?Recommendations for follow up therapy are one component of a multi-disciplinary discharge planning process, led by the attending physician.  Recommendations may be updated based on patient status, additional functional criteria and insurance authorization. ? ?Follow Up Recommendations ? Home health PT ?  ?  ?Assistance Recommended at Discharge Intermittent Supervision/Assistance  ?Patient can return home with the following Help with stairs or ramp for entrance ?  ?Equipment Recommendations ?    ?  ?Recommendations for Other Services   ? ? ?  ?Precautions / Restrictions Precautions ?Precautions: Fall ?Restrictions ?Weight Bearing Restrictions: No  ?  ? ?Mobility ? Bed Mobility ?  ?  ?  ?  ?  ?  ?  ?General bed mobility comments: OOB in bathroom ?  ? ?Transfers ?Overall transfer level: Needs assistance ?Equipment used: Rolling walker (2 wheels) ?Transfers: Sit to/from Stand ?Sit to Stand: Supervision, Min guard ?  ?  ?  ?  ?  ?General transfer comment: assist for safety ?   ? ?Ambulation/Gait ?Ambulation/Gait assistance: Min guard, Supervision ?Gait Distance (Feet): 250 Feet ?Assistive device: Rolling walker (2 wheels), None ?Gait Pattern/deviations: Step-through pattern, Trunk flexed ?Gait velocity: decreased ?  ?  ?General Gait Details: cues for RW management (pt is used to a  rollator), min/guard to supervision for safety especially with turns. unsteady however without  overt LOB; denied dizziness ? ? ?Stairs ?  ?  ?  ?  ?  ? ? ?Wheelchair Mobility ?  ? ?Modified Rankin (Stroke Patients Only) ?  ? ? ?  ?Balance   ?  ?  ?  ?  ?  ?  ?  ?  ?  ?  ?  ?  ?  ?  ?  ?  ?  ?  ?  ? ?  ?Cognition Arousal/Alertness: Awake/alert ?Behavior During Therapy: [facility] for tasks assessed/performed ?Overall Cognitive Status: Within Functional Limits for tasks assessed ?  ?  ?  ?  ?  ?  ?  ?  ?  ?  ?  ?  ?  ?  ?  ?  ?General Comments: AxO x 3 very pleasant ?  ?  ? ?  ?Exercises   ? ?  ?General Comments   ?  ?  ? ?Pertinent Vitals/Pain Pain Assessment ?Pain Assessment: No/denies pain  ? ? ?Home Living   ?  ?  ?  ?  ?  ?  ?  ?  ?  ?   ?  ?Prior Function    ?  ?  ?   ? ?PT Goals (current goals can now be found in the  care plan section)   ? ?  ?Frequency ? ? ? Min 3X/week ? ? ? ?  ?PT Plan Current plan remains appropriate  ? ? ?Co-evaluation   ?  ?  ?  ?  ? ?  ?AM-PAC PT "6 Clicks" Mobility   ?Outcome Measure ? Help needed turning from your back to your side while in a flat bed without using bedrails?: A Little ?Help needed moving from lying on your back to sitting on the side of a flat bed without using bedrails?: A Little ?Help needed moving to and from a bed to a chair (including a wheelchair)?: A Little ?Help needed standing up from a chair using your arms (e.g., wheelchair or bedside chair)?: A Little ?Help needed to walk in hospital room?: A Little ?Help needed climbing 3-5 steps with a railing? : A Little ?6 Click Score: 18 ? ?  ?End of Session Equipment Utilized During Treatment: Gait belt ?Activity  Tolerance: Patient tolerated treatment well ?Patient left: in bed;with call bell/phone within reach;with nursing/sitter in room ?  ?PT Visit Diagnosis: Other abnormalities of gait and mobility (R26.89);Difficulty in walking, not elsewhere classified (R26.2) ?  ? ? ?Time: 4081-4481 ?PT Time Calculation (min) (ACUTE ONLY): 27 min ? ?Charges:  $Gait Training: 8-22 mins ?$Therapeutic Activity: 8-22 mins          ?          ? ?Felecia Shelling  PTA ?Acute  Rehabilitation Services ?Pager      [phone] ?Office      [phone] ? ? ?

## 2021-05-27 DIAGNOSIS — R45851 Suicidal ideations: Secondary | ICD-10-CM

## 2021-05-27 LAB — GLUCOSE, CAPILLARY: Glucose-Capillary: 86 mg/dL (ref 70–99)

## 2021-05-27 MED ORDER — GABAPENTIN 100 MG PO CAPS: 100.0000 mg | ORAL_CAPSULE | Freq: Two times a day (BID) | ORAL | Status: DC

## 2021-05-27 MED ORDER — LORAZEPAM 0.5 MG PO TABS
0.2500 mg | ORAL_TABLET | Freq: Three times a day (TID) | ORAL | 0 refills | Status: AC | PRN
Start: 2021-05-27 — End: ?

## 2021-05-27 MED ORDER — DULOXETINE HCL 20 MG PO CPEP: 20.0000 mg | ORAL_CAPSULE | Freq: Two times a day (BID) | ORAL | 3 refills | Status: AC

## 2021-05-27 NOTE — Progress Notes (Signed)
Occupational Therapy Treatment ?Patient Details ?Name: Lynn Price ?MRN: [id number] ?DOB: Jul 12, 1940 ?Today's Date: 05/27/2021 ? ? ?History of present illness 81 y.o. female adm  with  back pain following a fall at home, multiple near syncopal episodes while in the emergency department.  Negative pelvic x-ray no acute fracture calcific tendinopathy of the hamstring muscle insertion; chest x-ray negative findings CTA showed no evidence of PE or other acute finding.  EKG nonischemic medical history significant of anxiety, fibromyalgia, GERD, hypertension, HSV unknown dental infection, IBS, neuropathy, memory loss, osteoarthritis, overactive bladder, restless leg syndrome, sleep apnea ?  ?OT comments ? Patient perform all ADLs without physical assistance and ambulated in hall with RW. She had no overt loss of balance, no complaints of dizziness or fatigue. Patient appears to be at her baseline in regards to ADLs. Patient has met goals and has no further OT needs.   ? ?Recommendations for follow up therapy are one component of a multi-disciplinary discharge planning process, led by the attending physician.  Recommendations may be updated based on patient status, additional functional criteria and insurance authorization. ?   ?Follow Up Recommendations ? No OT follow up  ?  ?Assistance Recommended at Discharge PRN  ?Patient can return home with the following ? Direct supervision/assist for medications management;Direct supervision/assist for financial management ?  ?Equipment Recommendations ? None recommended by OT  ?  ?Recommendations for Other Services   ? ?  ?Precautions / Restrictions Precautions ?Precautions: Fall ?Restrictions ?Weight Bearing Restrictions: No  ? ? ?  ? ?Mobility Bed Mobility ?Overal bed mobility: Modified Independent ?  ?  ?  ?  ?  ?  ?  ?  ? ?Transfers ?Overall transfer level: Modified independent ?Equipment used: Rolling walker (2 wheels) ?  ?  ?  ?  ?  ?  ?  ?General transfer comment:  Modified independent with RW to ambulate in room, bathroom and hallway. No physical assistance. ?  ?  ?Balance Overall balance assessment: Mild deficits observed, not formally tested ?  ?  ?  ?  ?  ?  ?  ?  ?  ?  ?  ?  ?  ?  ?  ?  ?  ?  ?   ? ?ADL either performed or assessed with clinical judgement  ? ?ADL Overall ADL's : Modified independent ?  ?  ?  ?  ?  ?  ?  ?  ?  ?  ?  ?  ?  ?  ?  ?  ?  ?  ?Functional mobility during ADLs: Supervision/safety ?General ADL Comments: No physical assistance to perform toileting, standing at sink and ambulation in hall with Rw. No overt loss of balance. No complaints of dizziness or fatigue. ?  ? ?Extremity/Trunk Assessment Upper Extremity Assessment ?Upper Extremity Assessment: Overall WFL for tasks assessed ?  ?Lower Extremity Assessment ?Lower Extremity Assessment: Defer to PT evaluation ?  ?Cervical / Trunk Assessment ?Cervical / Trunk Assessment: Normal ?  ? ?Vision Patient Visual Report: No change from baseline ?  ?  ?Perception   ?  ?Praxis   ?  ? ?Cognition Arousal/Alertness: Awake/alert ?Behavior During Therapy: [facility] for tasks assessed/performed ?Overall Cognitive Status: Within Functional Limits for tasks assessed ?  ?  ?  ?  ?  ?  ?  ?  ?  ?  ?  ?  ?  ?  ?  ?  ?General Comments: AxO x 3 very pleasant ?  ?  ?   ?  Exercises   ? ?  ?Shoulder Instructions   ? ? ?  ?General Comments    ? ? ?Pertinent Vitals/ Pain       Pain Assessment ?Pain Assessment: No/denies pain ? ?Home Living   ?  ?  ?  ?  ?  ?  ?  ?  ?  ?  ?  ?  ?  ?  ?  ?  ?  ?  ? ?  ?Prior Functioning/Environment    ?  ?  ?  ?   ? ?Frequency ?    ? ? ? ? ?  ?Progress Toward Goals ? ?OT Goals(current goals can now be found in the care plan section) ? Progress towards OT goals: Goals met/education completed, patient discharged from OT ? ?   ?Plan Discharge plan needs to be updated   ? ?Co-evaluation ? ? ?   ?  ?  ?  ?  ? ?  ?AM-PAC OT "6 Clicks" Daily Activity     ?Outcome Measure ? ? Help from another person eating  meals?: None ?Help from another person taking care of personal grooming?: None ?Help from another person toileting, which includes using toliet, bedpan, or urinal?: None ?Help from another person bathing (including washing, rinsing, drying)?: None ?Help from another person to put on and taking off regular upper body clothing?: None ?Help from another person to put on and taking off regular lower body clothing?: None ?6 Click Score: 24 ? ?  ?End of Session Equipment Utilized During Treatment: Rolling walker (2 wheels) ? ?OT Visit Diagnosis: Muscle weakness (generalized) (M62.81);History of falling (Z91.81) ?  ?Activity Tolerance Patient tolerated treatment well ?  ?Patient Left in bed;with call bell/phone within reach;with nursing/sitter in room ?  ?Nurse Communication Mobility status ?  ? ?   ? ?Time: 6226-3335 ?OT Time Calculation (min): 13 min ? ?Charges: OT General Charges ?$OT Visit: 1 Visit ?OT Treatments ?$Self Care/Home Management : 8-22 mins ? ?Lynn Price, Lynn Price ?Acute Care Rehab Services  ?Office [phone] ?Pager: [phone]  ? ?Lynn Price ?05/27/2021, 1:07 PM ?

## 2021-05-27 NOTE — TOC Transition Note (Signed)
Transition of Care (TOC) - CM/SW Discharge Note ? ? ?Patient Details  ?Name: Lynn Price ?MRN: [id number] ?Date of Birth: Jan 23, 1941 ? ?Transition of Care (TOC) CM/SW Contact:  ?Trystin Hargrove B Georga Stys, LCSWA ?Phone Number: ?05/27/2021, 5:38 PM ? ? ?Clinical Narrative:    ? ?CSW spoke with [facility] CSW confirmed transportation has not been set up for pt and needs to be set up by RN. CSW previously provided Safe Transport number to RN and reached back out to confirm that transportation would need to be called for pt, conversation was had with RN Toma Copier.  ? ?Final next level of care: [facility] ?Barriers to Discharge: Other (must enter comment) (pending acceptance) ? ? ?Patient Goals and CMS Choice ?  ?  ?  ? ?Discharge Placement ?  ?           ?  ?  ?  ?  ? ?Discharge Plan and Services ?  ?  ?           ?  ?  ?  ?  ?  ?  ?  ?  ?  ?  ? ?Social Determinants of Health (SDOH) Interventions ?Depression Interventions/Treatment : Referral to Psychiatry, Medication, Counseling ? ? ?Readmission Risk Interventions ?   ? View : No data to display.  ?  ?  ?  ? ? ? ? ? ?

## 2021-05-27 NOTE — Progress Notes (Signed)
CSW received a phone call from [facility] requesting that [place] refax referral. CSW referral and confirmed that referral was received. Pt is currently under review at [facility] per [place].  ? ?Benjaman Kindler, MSW, LCSWA ?05/27/2021 4:13 PM ? ? ?

## 2021-05-27 NOTE — Consult Note (Signed)
[facility] Face-to-Face Psychiatry Consult  ? ?Reason for Consult:  Suicidal ideation and Depression ?Referring Physician:  MD Maren Beach  ?Patient Identification: Lynn Price ?MRN:  [id number] ?Principal Diagnosis: Near syncope ?Diagnosis:  Principal Problem: ?  Near syncope ?Active Problems: ?  Memory loss ?  Essential hypertension, benign ?  Depression, major, single episode, severe ([place]) ?  Gastroesophageal reflux disease with esophagitis without hemorrhage ?  Pure hypercholesterolemia ?  Peripheral neuropathy ?  Chronic kidney disease, stage 3 unspecified ([place]) ?  Mild protein malnutrition ([place]) ?  Aortic calcification (HCC) ?  Orthostatic hypotension ? ? ?Total Time spent with patient: 15 minutes ? ?Subjective:   ?Lynn Price is a 81 y.o. female was seen and evaluated face-to-face.  She continues to report suicidal thoughts and worsening depression.  Stated " I just assume to die rather than to continue to live like this nobody wants to give me back my lorazepam."  Reported issues with insomnia related to medication.  Rates her depression 8 out of 10 with 10 being the worst.  Patient presents slightly irritable and agitated throughout this assessment.  States " my daughter-in-law said that I can come and live with her in [place]."  Stated that she is willing to move close to her daughter in law, because her son resides in [place].  Discussed the need for continued psychiatric inpatient admission for mood stabilization.  Patient was receptive to plan. ? ?HPI:  Per initial admission assessment note: Patient presented to the [facility] ED on 05/21/2021 evaluation of multiple falls and back pain.  While in the ED, patient made multiple statement expressing her wish to die and was asking staff to give her segment to facilitate her death. She also reported that she had tried to take her life before but could not remember how.  Therefore, behavioral health was consulted she was felt to meet criteria for a geri-psych  hospitalization. While waiting for a bed, patient had 2 episodes of near syncope that occurred with standing. ? ?Past Psychiatric History:  ? ?Risk to Self:   ?Risk to Others:   ?Prior Inpatient Therapy:   ?Prior Outpatient Therapy:   ? ?Past Medical History:  ?Past Medical History:  ?Diagnosis Date  ? Anxiety   ? Fibromyalgia   ? GERD (gastroesophageal reflux disease)   ? High blood pressure   ? HSV (herpes simplex virus) anogenital infection   ? IBS (irritable bowel syndrome)   ? Memory loss   ? Neuropathy   ? Obesity (BMI 30-39.9) 04/01/2014  ? Osteoarthritis   ? Overactive bladder   ? Restless leg syndrome   ? Sleep apnea   ?  ?Past Surgical History:  ?Procedure Laterality Date  ? APPENDECTOMY    ? CATARACT EXTRACTION Left   ? CESAREAN SECTION    ? CHOLECYSTECTOMY    ? colonscopy    ? MULTIPLE TOOTH EXTRACTIONS    ? ROTATOR CUFF REPAIR Left   ? TOTAL KNEE ARTHROPLASTY    ? ?Family History:  ?Family History  ?Problem Relation Age of Onset  ? Depression Mother   ? Heart failure Mother   ? Depression Father   ? Heart attack Father   ? Mental illness Sister   ? Mental illness Sister   ? ?Family Psychiatric  History:  ?Social History:  ?Social History  ? ?Substance and Sexual Activity  ?Alcohol Use Not Currently  ? Comment: wine occas  ?   ?Social History  ? ?Substance and Sexual Activity  ?  Drug Use No  ?  ?Social History  ? ?Socioeconomic History  ? Marital status: Widowed  ?  Spouse name: Nadara Mustard  ? Number of children: 2  ? Years of education: college  ? Highest education level: Associate degree: academic program  ?Occupational History  ?  Comment: retired  ?Tobacco Use  ? Smoking status: Never  ? Smokeless tobacco: Never  ?Vaping Use  ? Vaping Use: Never used  ?Substance and Sexual Activity  ? Alcohol use: Not Currently  ?  Comment: wine occas  ? Drug use: No  ? Sexual activity: Not on file  ?Other Topics Concern  ? Not on file  ?Social History Narrative  ? Patient passed away 2019/11/19, and she is retired. Patient has  2 years of college. Patient has 2 children.  ? 12/09/20 lives at [facility], independent living    ? ?Social Determinants of Health  ? ?Financial Resource Strain: Not on file  ?Food Insecurity: Not on file  ?Transportation Needs: Not on file  ?Physical Activity: Not on file  ?Stress: Not on file  ?Social Connections: Not on file  ? ?Additional Social History: ?  ? ?Allergies:   ?Allergies  ?Allergen Reactions  ? Erythromycin Rash  ? Penicillins Rash and Other (See Comments)  ?  Remote reaction, no medical attention required ?No cutaneous or anaphylactic symptoms ?Tolerates Keflex  ? Sulfa Antibiotics Rash  ? Tetracyclines & Related Rash  ? ? ?Labs:  ?Results for orders placed or performed during the hospital encounter of 05/21/21 (from the past 48 hour(s))  ?Glucose, capillary     Status: Abnormal  ? Collection Time: 05/26/21 11:43 AM  ?Result Value Ref Range  ? Glucose-Capillary 105 (H) 70 - 99 mg/dL  ?  Comment: Glucose reference range applies only to samples taken after fasting for at least 8 hours.  ?Glucose, capillary     Status: None  ? Collection Time: 05/27/21  5:20 AM  ?Result Value Ref Range  ? Glucose-Capillary 86 70 - 99 mg/dL  ?  Comment: Glucose reference range applies only to samples taken after fasting for at least 8 hours.  ? ? ?Current Facility-Administered Medications  ?Medication Dose Route Frequency Provider Last Rate Last Admin  ? 0.9 %  sodium chloride infusion   Intravenous Continuous Kc, Ramesh, MD 50 mL/hr at 05/26/21 2200 New Bag at 05/26/21 2200  ? acetaminophen (TYLENOL) tablet 325 mg  325 mg Oral Q6H PRN Reubin Milan, MD   325 mg at 05/26/21 1319  ? Or  ? acetaminophen (TYLENOL) suppository 325 mg  325 mg Rectal Q6H PRN Reubin Milan, MD      ? acyclovir Maury Dus) tablet 400 mg  400 mg Oral BID Reubin Milan, MD   400 mg at 05/26/21 2154  ? docusate sodium (COLACE) capsule 100 mg  100 mg Oral Q12H Reubin Milan, MD   100 mg at 05/26/21 2154  ? donepezil  (ARICEPT) tablet 5 mg  5 mg Oral QHS Reubin Milan, MD   5 mg at 05/26/21 2153  ? DULoxetine (CYMBALTA) DR capsule 20 mg  20 mg Oral BID Cinderella, Margaret A   20 mg at 05/26/21 1710  ? enoxaparin (LOVENOX) injection 40 mg  40 mg Subcutaneous Q24H Lynelle Doctor, RPH   40 mg at 05/26/21 2154  ? gabapentin (NEURONTIN) capsule 100 mg  100 mg Oral BID Reubin Milan, MD   100 mg at 05/26/21 2153  ? HYDROcodone-acetaminophen (NORCO/VICODIN) 5-325 MG  per tablet 1 tablet  1 tablet Oral Q8H PRN Antonieta Pert, MD   1 tablet at 05/26/21 2153  ? LORazepam (ATIVAN) tablet 0.25 mg  0.25 mg Oral TID PRN Suella Broad, FNP   0.25 mg at 05/27/21 [id number]  ? melatonin tablet 10 mg  10 mg Oral QHS Reubin Milan, MD   10 mg at 05/26/21 2153  ? omega-3 acid ethyl esters (LOVAZA) capsule 1 g  1 g Oral Daily Antonieta Pert, MD   1 g at 05/26/21 0950  ? ondansetron (ZOFRAN) tablet 4 mg  4 mg Oral Q6H PRN Reubin Milan, MD      ? Or  ? ondansetron [facility]) injection 4 mg  4 mg Intravenous Q6H PRN Reubin Milan, MD      ? polyethylene glycol (MIRALAX / GLYCOLAX) packet 17 g  17 g Oral Daily Antonieta Pert, MD   17 g at 05/26/21 0949  ? sodium chloride (OCEAN) 0.65 % nasal spray 1 spray  1 spray Each Nare PRN Reubin Milan, MD   1 spray at 05/23/21 2143  ? sodium chloride flush (NS) 0.9 % injection 3 mL  3 mL Intravenous Q12H Reubin Milan, MD   3 mL at 05/26/21 2156  ? ? ?Musculoskeletal: ?Strength & Muscle Tone: within normal limits ?Gait & Station: sitting in bed ?Patient leans: N/A ? ? ? ? ? ? ? ? ? ? ? ?Psychiatric Specialty Exam: ? ?Presentation  ?General Appearance: Appropriate for Environment; Casual ? ?Eye Contact:Fair ? ?Speech:Clear and Coherent; Slow ? ?Speech Volume:Decreased ? ?Handedness:Right ? ? ?Mood and Affect  ?Mood:Dysphoric; Anxious; Depressed ? ?Affect:Congruent; Depressed; Restricted ? ? ?Thought Process  ?Thought Processes:Coherent; Goal Directed; Linear ? ?Descriptions of  Associations:Intact ? ?Orientation:Full (Time, Place and Person) ? ?Thought Content:Logical ? ?History of Schizophrenia/Schizoaffective disorder:No ? ?Duration of Psychotic Symptoms:N/A ? ?Hallucinations:No data recor

## 2021-05-27 NOTE — Discharge Summary (Signed)
Physician Discharge Summary  ?Lynn Price [id number] DOB: November 25, 1940 DOA: 05/21/2021 ? ?PCP: Gaynelle Arabian, MD ? ?Admit date: 05/21/2021 ?Discharge date: 05/27/2021 ?Recommendations for Outpatient Follow-up:  ?Follow up with PCP in 1 weeks-call for appointment ?Please obtain BMP/CBC in one week ? ?Discharge Dispo: inpatient psychiatry ?Discharge Condition: Stable ?Code Status:   Code Status: Full Code ?Diet recommendation:  ?Diet Order   ? ?       ?  Diet Heart Room service appropriate? Yes; Fluid consistency: Thin  Diet effective now       ?  ? ?  ?  ? ?  ?Brief/Interim Summary: ?I will29 y.o. female with medical history significant of anxiety, fibromyalgia, GERD, hypertension, HSV unknown dental infection, IBS, neuropathy, memory loss, underweight (used to have class I obesity), osteoarthritis, overactive bladder, restless leg syndrome, sleep apnea who came to the emergency department 2 days PTA [place] history of back pain following a fall at home. She has had multiple near syncopal episodes while in the emergency department. ?Work-up in the ED showed stable vital signs, fairly stable lab work 2.  Negative pelvic x-ray no acute fracture calcific tendinopathy of the hamstring muscle insertion chest x-ray negative findings CTA showed no evidence of PE or other acute finding, has aortic atherosclerosis.  EKG nonischemic ?She was admitted for evaluation of near syncope: Holding diltiazem, placed on telemetry, echocardiogram carotid duplex ordered and cardiology was consulted.  Echo showed EF 65 to 70%, G1 DD SF multiple mitral valve with mild MR. Carotid duplex was unremarkable. ?In the ED seen by psychiatry as well continue on Cymbalta for depression anxiety and pain along with Neurontin for mood/pain ?Patient orthostatic vitals were abnormal-patient IV fluid hydration.  Seen by psychiatry on one to one and need inpatient psychiatric admissions.  Patient was again orthostatic positive on 3/28, 3/29- but  asymptomatic now, added compression stocking- 04/3128-orthostatic vitals are negative. Off ivf.Waiting for Geri psychiatric placement  ?She is stable for discharge to inpatient psych today. ? ?Discharge Diagnoses:  ?Principal Problem: ?  Near syncope ?Active Problems: ?  Memory loss ?  Essential hypertension, benign ?  Depression, major, single episode, severe ([place]) ?  Gastroesophageal reflux disease with esophagitis without hemorrhage ?  Pure hypercholesterolemia ?  Peripheral neuropathy ?  Chronic kidney disease, stage 3 unspecified ([place]) ?  Mild protein malnutrition ([place]) ?  Aortic calcification (HCC) ?  Orthostatic hypotension ?  Suicidal thoughts ?Near syncope ?Orthostatic hypotension: ?Received IV boluses multiple times.positive with orthostatics on 3/28-3/29 but not symptomatic, started compression stocking, kept on gentle IV fluids-This morning orthostatic vitals are negative.dc ivf.Off Cardizem.Echocardiogram unremarkable, carotid duplex no acute finding.  Appreciate cardiology input -no arrhythmia noted in the second episode of near syncopal episode in the ED. ?  ?Memory loss ?Depression, major, single episode, severe  ?Suicidal ideation ?: ?Mentating well.  Psych following closely and has recommended inpatient geriatric psychiatric admission.   ?Continue current meds, including Cymbalta and lorazepam donezepil.  Further plan per psychiatry. ?  ?GERD: on PPI ?  ?Essential hypertension, benign: Now with orthostatic hypotension Cardizem discontinued.   ?Pure hypercholesterolemia: Not on any therapy.follow-up with PCP ?Peripheral neuropathy: Continue Neurontin ?Chronic kidney disease, stage 3a: Stable at baseline ?Last Labs   ?     ?Recent Labs  ?Lab 05/21/21 ?1000 05/23/21 ?0323 05/23/21 ?1100  ?BUN 19 16  --   ?CREATININE 1.04* 0.97 0.93  ?  ?  ?Mild protein malnutrition:Consult dietitian ?Aortic calcification: Incidentally noted, outpatient cardiology follow-up. ? ?Consults: ?psychiatry ?Subjective: ?  Did  not sleep well last night ? ?Discharge Exam: ?Vitals:  ? 05/27/21 0934 05/27/21 1235  ?BP:  (!) 145/74  ?Pulse:  64  ?Resp: 16 18  ?Temp: 97.9 ?F (36.6 ?C) (!) 97.2 ?F (36.2 ?C)  ?SpO2: 100% 100%  ? ?General: Pt is alert, awake, not in acute distress ?Cardiovascular: RRR, S1/S2 +, no rubs, no gallops ?Respiratory: CTA bilaterally, no wheezing, no rhonchi ?Abdominal: Soft, NT, ND, bowel sounds + ?Extremities: no edema, no cyanosis ? ?Discharge Instructions ? ? ?Allergies as of 05/27/2021   ? ?   Reactions  ? Erythromycin Rash  ? Penicillins Rash, Other (See Comments)  ? Remote reaction, no medical attention required ?No cutaneous or anaphylactic symptoms ?Tolerates Keflex  ? Sulfa Antibiotics Rash  ? Tetracyclines & Related Rash  ? ?  ? ?  ?Medication List  ?  ? ?STOP taking these medications   ? ?diltiazem 120 MG 24 hr capsule ?Commonly known as: CARDIZEM CD ?  ? ?  ? ?TAKE these medications   ? ?acetaminophen 325 MG tablet ?Commonly known as: TYLENOL ?Take 325 mg by mouth See admin instructions. Take one tablet (325 mg) by mouth twice daily; may also take one tablet (325 mg) every 6 hours as needed for pain ?  ?acyclovir 400 MG tablet ?Commonly known as: ZOVIRAX ?Take 400 mg by mouth 2 (two) times daily. ?  ?docusate sodium 100 MG capsule ?Commonly known as: COLACE ?Take 1 capsule (100 mg total) by mouth every 12 (twelve) hours. ?  ?donepezil 5 MG tablet ?Commonly known as: ARICEPT ?TAKE 1 TABLET BY MOUTH AT  BEDTIME ?  ?DULoxetine 20 MG capsule ?Commonly known as: CYMBALTA ?Take 1 capsule (20 mg total) by mouth 2 (two) times daily. ?Start taking on: May 28, 2021 ?  ?Fish Oil 1200 MG Caps ?Take 1,200 mg by mouth every morning. ?  ?gabapentin 100 MG capsule ?Commonly known as: NEURONTIN ?Take 1 capsule (100 mg total) by mouth 2 (two) times daily. ?What changed:  ?medication strength ?how much to take ?how to take this ?when to take this ?additional instructions ?  ?LORazepam 0.5 MG tablet ?Commonly known as:  ATIVAN ?Take 0.5 tablets (0.25 mg total) by mouth 3 (three) times daily as needed for anxiety. ?What changed: when to take this ?  ?Melatonin 10 MG Caps ?Take 10 mg by mouth at bedtime. ?  ?ondansetron 4 MG disintegrating tablet ?Commonly known as: ZOFRAN-ODT ?4mg  ODT q4 hours prn nausea/vomit ?What changed:  ?how much to take ?when to take this ?reasons to take this ?additional instructions ?  ?polyethylene glycol 17 g packet ?Commonly known as: MIRALAX / GLYCOLAX ?Take 17 g by mouth daily. ?  ?sodium phosphate 7-19 GM/118ML Enem ?Place 133 mLs (1 enema total) rectally daily as needed for up to 5 doses for severe constipation. ?  ? ?  ? ? ?Allergies  ?Allergen Reactions  ? Erythromycin Rash  ? Penicillins Rash and Other (See Comments)  ?  Remote reaction, no medical attention required ?No cutaneous or anaphylactic symptoms ?Tolerates Keflex  ? Sulfa Antibiotics Rash  ? Tetracyclines & Related Rash  ? ? ?The results of significant diagnostics from this hospitalization (including imaging, microbiology, ancillary and laboratory) are listed below for reference.   ? ?Microbiology: ?Recent Results (from the past 240 hour(s))  ?Resp Panel by RT-PCR (Flu A&B, Covid) Nasopharyngeal Swab     Status: None  ? Collection Time: 05/21/21 12:35 PM  ? Specimen: Nasopharyngeal Swab; Nasopharyngeal(NP) swabs in vial  transport medium  ?Result Value Ref Range Status  ? SARS Coronavirus 2 by RT PCR NEGATIVE NEGATIVE Final  ?  Comment: (NOTE) ?SARS-CoV-2 target nucleic acids are NOT DETECTED. ? ?The SARS-CoV-2 RNA is generally detectable in upper respiratory ?specimens during the acute phase of infection. The lowest ?concentration of SARS-CoV-2 viral copies this assay can detect is ?138 copies/mL. A negative result does not preclude SARS-Cov-2 ?infection and should not be used as the sole basis for treatment or ?other patient management decisions. A negative result may occur with  ?improper specimen collection/handling, submission of  specimen other ?than nasopharyngeal swab, presence of viral mutation(s) within the ?areas targeted by this assay, and inadequate number of viral ?copies(<138 copies/mL). A negative result must be combined with ?clinical ob

## 2021-05-27 NOTE — Progress Notes (Signed)
?PROGRESS NOTE ?Lynn Price  [id number] DOB: 06-Oct-1940 DOA: 05/21/2021 ?PCP: Gaynelle Arabian, MD  ? ?Brief Narrative/Hospital Course: ?I will10 y.o. female with medical history significant of anxiety, fibromyalgia, GERD, hypertension, HSV unknown dental infection, IBS, neuropathy, memory loss, underweight (used to have class I obesity), osteoarthritis, overactive bladder, restless leg syndrome, sleep apnea who came to the emergency department 2 days PTA [place] history of back pain following a fall at home. She has had multiple near syncopal episodes while in the emergency department. ?Work-up in the ED showed stable vital signs, fairly stable lab work 2.  Negative pelvic x-ray no acute fracture calcific tendinopathy of the hamstring muscle insertion chest x-ray negative findings CTA showed no evidence of PE or other acute finding, has aortic atherosclerosis.  EKG nonischemic ?She was admitted for evaluation of near syncope: Holding diltiazem, placed on telemetry, echocardiogram carotid duplex ordered and cardiology was consulted.  Echo showed EF 65 to 70%, G1 DD SF multiple mitral valve with mild MR. Carotid duplex was unremarkable. ?In the ED seen by psychiatry as well continue on Cymbalta for depression anxiety and pain along with Neurontin for mood/pain ?Patient orthostatic vitals were abnormal-patient IV fluid hydration.  Seen by psychiatry on one to one and need inpatient psychiatric admissions.  Patient was again orthostatic positive on 3/28, 3/29- but asymptomatic now, added compression stocking- 04/3128-orthostatic vitals are negative. Off ivf.Waiting for Geri psychiatric placement ? ?Subjective: ?Seen and examined this morning.  She reports she did not have a good sleep last night. ?Orthostatic vitals negative this morning ?Overnight patient is afebrile ?Blood pressure in 130s ?  ?Assessment and Plan: ?Principal Problem: ?  Near syncope ?Active Problems: ?  Memory loss ?  Essential hypertension, benign ?   Depression, major, single episode, severe ([place]) ?  Gastroesophageal reflux disease with esophagitis without hemorrhage ?  Pure hypercholesterolemia ?  Peripheral neuropathy ?  Chronic kidney disease, stage 3 unspecified ([place]) ?  Mild protein malnutrition ([place]) ?  Aortic calcification ([facility]) ?  Orthostatic hypotension ?  ?Near syncope ?Orthostatic hypotension: ?Received IV boluses multiple times.positive with orthostatics on 3/28-3/29 but not symptomatic, started compression stocking, kept on gentle IV fluids-This morning orthostatic vitals are negative.dc ivf.Off Cardizem.Echocardiogram unremarkable, carotid duplex no acute finding.  Appreciate cardiology input -no arrhythmia noted in the second episode of near syncopal episode in the ED. ? ?Memory loss ?Depression, major, single episode, severe  ?Suicidal ideation ?: ?Mentating well.  Psych following closely and has recommended inpatient geriatric psychiatric admission.   ?Continue current meds, including Cymbalta and lorazepam donezepil.  Further plan per psychiatry. ? ?GERD: on PPI ? ?Essential hypertension, benign: Now with orthostatic hypotension Cardizem discontinued.   ?Pure hypercholesterolemia: Not on any therapy.follow-up with PCP ?Peripheral neuropathy: Continue Neurontin ?Chronic kidney disease, stage 3a: Stable at baseline ?Recent Labs  ?Lab 05/21/21 ?1000 05/23/21 ?0323 05/23/21 ?1100  ?BUN 19 16  --   ?CREATININE 1.04* 0.97 0.93  ?  ?Mild protein malnutrition:Consult dietitian ?Aortic calcification: Incidentally noted, outpatient cardiology follow-up. ?  ?Low Body mass index is 18.09 kg/m?Marland Kitchen  Consult dietitian augment nutritional status. ?DVT prophylaxis: enoxaparin (LOVENOX) injection 40 mg Start: 05/24/21 2200 ?Code Status:   Code Status: Full Code ?Family Communication: plan of care discussed with patient at bedside. ?Patient status is: Inpatient , Telemetry  ?Patient currently not stable ? ?Dispo: The patient is from: home ?           Anticipated  disposition: Inpatient geriatric psychiatric facility once available.  Patient is medically  stable for discharge ? ?Mobility Assessment (last 72 hours)   ? ? Mobility Assessment   ? ? [place] Name 05/27/21 0840 05/26/21 2100 05/26/21 0835 05/25/21 2100 05/24/21 2123  ? Does patient have an order for bedrest or is patient medically unstable No - Continue assessment No - Continue assessment No - Continue assessment No - Continue assessment No - Continue assessment  ? What is the highest level of mobility based on the progressive mobility assessment? Level 5 (Walks with assist in room/hall) - Balance while stepping forward/back and can walk in room with assist - Complete Level 5 (Walks with assist in room/hall) - Balance while stepping forward/back and can walk in room with assist - Complete Level 5 (Walks with assist in room/hall) - Balance while stepping forward/back and can walk in room with assist - Complete Level 5 (Walks with assist in room/hall) - Balance while stepping forward/back and can walk in room with assist - Complete Level 5 (Walks with assist in room/hall) - Balance while stepping forward/back and can walk in room with assist - Complete  ? ? [place] Name 05/24/21 1420 05/24/21 1226  ?  ?  ?  ? What is the highest level of mobility based on the progressive mobility assessment? Level 5 (Walks with assist in room/hall) - Balance while stepping forward/back and can walk in room with assist - Complete Level 5 (Walks with assist in room/hall) - Balance while stepping forward/back and can walk in room with assist - Complete     ? ?  ?  ? ?  ?  ? ?Objective: ?Vitals last 24 hrs: ?Vitals:  ? 05/26/21 2050 05/27/21 0216 05/27/21 [id number] 05/27/21 0934  ?BP: (!) 147/66 (!) 155/94 131/70   ?Pulse: 77 75 72   ?Resp: 14 14 14 16   ?Temp: 97.6 ?F (36.4 ?C) 97.8 ?F (36.6 ?C) 98.3 ?F (36.8 ?C) 97.9 ?F (36.6 ?C)  ?TempSrc: Oral Oral Oral Oral  ?SpO2: 100% 100% 97% 100%  ?Weight:      ?Height:      ? ?Weight change:  ? ?Physical  Examination: ?General exam: AAox3,older than stated age, weak appearing. ?HEENT:Oral mucosa moist, Ear/Nose WNL grossly, dentition normal. ?Respiratory system: bilaterally clear, no use of accessory muscle ?Cardiovascular system: S1 & S2 +,No JVD. ?Gastrointestinal system: Abdomen soft,NT,ND,BS+. ?Nervous System:Alert, awake, moving extremities and grossly nonfocal. ?Extremities: edema neg,distal peripheral pulses palpable.  ?Skin: No rashes,no icterus. ?MSK: Normal muscle bulk,tone, power ? ? ?Medications reviewed:  ?Scheduled Meds: ? acyclovir  400 mg Oral BID  ? docusate sodium  100 mg Oral Q12H  ? donepezil  5 mg Oral QHS  ? DULoxetine  20 mg Oral BID  ? enoxaparin (LOVENOX) injection  40 mg Subcutaneous Q24H  ? gabapentin  100 mg Oral BID  ? melatonin  10 mg Oral QHS  ? omega-3 acid ethyl esters  1 g Oral Daily  ? polyethylene glycol  17 g Oral Daily  ? sodium chloride flush  3 mL Intravenous Q12H  ?Continuous Infusions: ? sodium chloride 50 mL/hr at 05/26/21 2200  ?  ?Diet Order   ? ?       ?  Diet Heart Room service appropriate? Yes; Fluid consistency: Thin  Diet effective now       ?  ? ?  ?  ? ?  ? ?Data Reviewed: I have personally reviewed following labs and imaging studies ?CBC: ?Recent Labs  ?Lab 2021-06-06 ?1000 05/23/21 ?0323 05/23/21 ?1100  ?WBC 6.5 8.3 8.6  ?  NEUTROABS 3.9 5.3  --   ?HGB 13.3 11.4* 11.6*  ?HCT 38.8 34.5* 34.8*  ?MCV 91.5 93.8 94.1  ?PLT 254 242 237  ? ?Basic Metabolic Panel: ?Recent Labs  ?Lab 05/21/21 ?1000 05/23/21 ?0323 05/23/21 ?1100  ?NA 137 136  --   ?K 5.1 3.7  --   ?CL 103 105  --   ?CO2 22 23  --   ?GLUCOSE 82 113*  --   ?BUN 19 16  --   ?CREATININE 1.04* 0.97 0.93  ?CALCIUM 10.7* 9.1  --   ? ?GFR: ?Estimated Creatinine Clearance: 38.6 mL/min (by C-G formula based on SCr of 0.93 mg/dL). ?Liver Function Tests: ?CBG: ?Recent Labs  ?Lab 05/23/21 ?0730 05/24/21 ?[id number] 05/25/21 ?0419 05/26/21 ?1143 05/27/21 ?[id number]  ?GLUCAP 116* 89 86 105* 86  ?Antimicrobials: ?Anti-infectives (From  admission, onward)  ? ? Start     Dose/Rate Route Frequency Ordered Stop  ? 05/22/21 1000  acyclovir (ZOVIRAX) tablet 400 mg       ? 400 mg Oral 2 times daily 05/21/21 2215    ? ?  ? ?Culture/Microbiology ?

## 2021-05-27 NOTE — Progress Notes (Signed)
Pt for discharge to [facility].  ?Report called to Diane on receiving unit.  ?AVS printed and sent with pt.  ?Safe Ride/Travel to transport. PCT to travel with patient and then return for safety sitter: SI.  ? ?

## 2021-05-27 NOTE — TOC Progression Note (Signed)
Transition of Care (TOC) - Progression Note  ? ? ?Patient Details  ?Name: Lynn Price ?MRN: [id number] ?Date of Birth: [birth year] ? ?Transition of Care (TOC) CM/SW Contact  ?Ida Rogue, LCSW ?Phone Number: ?05/27/2021, 9:50 AM ? ?Clinical Narrative:   Sent another message to [facility] asking to review for gero psych placement.  Secure chat seen by Robinette Haines at [facility]. TOC will continue to follow during the course of hospitalization. ? ? ? ? ?  ?Barriers to Discharge: Other (must enter comment) (pending acceptance) ? ?Expected Discharge Plan and Services ?  ?  ?  ?  ?  ?                ?  ?  ?  ?  ?  ?  ?  ?  ?  ?  ? ? ?Social Determinants of Health (SDOH) Interventions ?Depression Interventions/Treatment : Referral to Psychiatry, Medication, Counseling ? ?Readmission Risk Interventions ?   ? View : No data to display.  ?  ?  ?  ? ? ?

## 2021-05-27 NOTE — Care Management Important Message (Signed)
Important Message ? ?Patient Details IM Letter placed in Patients room. ?Name: Lynn Price ?MRN: [id number] ?Date of Birth: 1940/03/03 ? ? ?Medicare Important Message Given:  Yes ? ? ? ? ?Kerin Salen ?05/27/2021, 10:30 AM ?

## 2021-05-27 NOTE — Progress Notes (Signed)
Pt was accepted to [facility] today 05/27/2021 after 5:30pm ? ?Pt meets inpatient criteria per Hillery Jacks, NP ? ?Attending Physician will be Dr. Althea Grimmer ? ?Report can be called to: - [phone] ? ?Pt can arrive After 5:30pm ? ?Nursing notified: Luvenia Redden, RN. Nursing please call report when transport arrives. ? ?Kelton Pillar, LCSWA ?05/27/2021 @ 4:20 PM ? ? ?okay that's fine. Thanks! Dr. Nada Maclachlan unit [phone] please call report when transport arrives ?

## 2021-05-27 NOTE — Plan of Care (Signed)
Pt Aox4, tearful, sad, and anxious at varying times but cooperative with staff.  ?1:1 Safety Sitter in place. Psych following.  ? ?Problem: Education: ?Goal: Knowledge of General Education information will improve ?Description: Including pain rating scale, medication(s)/side effects and non-pharmacologic comfort measures ?Outcome: Progressing ?  ?Problem: Health Behavior/Discharge Planning: ?Goal: Ability to manage health-related needs will improve ?Outcome: Progressing ?  ?Problem: Clinical Measurements: ?Goal: Ability to maintain clinical measurements within normal limits will improve ?Outcome: Progressing ?Goal: Will remain free from infection ?Outcome: Progressing ?Goal: Diagnostic test results will improve ?Outcome: Progressing ?Goal: Respiratory complications will improve ?Outcome: Progressing ?Goal: Cardiovascular complication will be avoided ?Outcome: Progressing ?  ?Problem: Activity: ?Goal: Risk for activity intolerance will decrease ?Outcome: Progressing ?  ?Problem: Nutrition: ?Goal: Adequate nutrition will be maintained ?Outcome: Progressing ?  ?Problem: Coping: ?Goal: Level of anxiety will decrease ?Outcome: Progressing ?  ?Problem: Elimination: ?Goal: Will not experience complications related to bowel motility ?Outcome: Progressing ?Goal: Will not experience complications related to urinary retention ?Outcome: Progressing ?  ?Problem: Pain Managment: ?Goal: General experience of comfort will improve ?Outcome: Progressing ?  ?Problem: Safety: ?Goal: Ability to remain free from injury will improve ?Outcome: Progressing ?  ?Problem: Skin Integrity: ?Goal: Risk for impaired skin integrity will decrease ?Outcome: Progressing ?  ?

## 2021-05-27 NOTE — Progress Notes (Signed)
[facility] ? ?Pt meets inpatient criteria per Melvyn Neth, NP. Per Elane Fritz, DO there is no availability at [facility].  Referral was sent to the following facilities;  ? ?Destination ?Service Provider Address Phone Fax  ?[facility]  [address] [place] [address] [phone]  ?[facility]  [address] [place] [place] [address] [phone]  ?[facility]  [address], [place] [address] [phone]  ?[facility]  [address] [place] [address] [phone]  ?[facility]  [address], [place] [address] [phone]  ?[facility]  [address] [place] [address] [phone]  ?[facility]  [address], [place] [address] [phone]  ?[facility]  [address], [place] [address] [phone]  ?[facility]  [address] [place] [place] [address] [phone]  ?[facility]  [address] [place] [address] [phone]  ?[facility]  [address] [place] [address] [phone]  ?[facility]  [address] [place], [place] [address] [phone]  ?[facility]  [address], [place] [address] [phone]  ?[facility] [address] [place] [address] [phone]  ?[facility]  [address] [place] [address] [phone]  ?[facility]  [address] [place] [address] [phone]  ?[facility]      ? ? ?Situation ongoing,  CSW will follow up. ? ? ?Maryjean Ka, MSW, LCSWA ?05/27/2021  @ 1:16 PM ? ?

## 2021-06-09 ENCOUNTER — Encounter: Payer: Self-pay | Admitting: Neurology

## 2021-06-09 ENCOUNTER — Ambulatory Visit: Payer: Medicare Other | Admitting: Neurology

## 2021-06-09 VITALS — BP 157/79 | HR 69 | Wt 110.0 lb

## 2021-06-09 DIAGNOSIS — F39 Unspecified mood [affective] disorder: Secondary | ICD-10-CM

## 2021-06-09 DIAGNOSIS — G3184 Mild cognitive impairment, so stated: Secondary | ICD-10-CM

## 2021-06-09 DIAGNOSIS — K589 Irritable bowel syndrome without diarrhea: Secondary | ICD-10-CM | POA: Insufficient documentation

## 2021-06-09 DIAGNOSIS — I129 Hypertensive chronic kidney disease with stage 1 through stage 4 chronic kidney disease, or unspecified chronic kidney disease: Secondary | ICD-10-CM | POA: Insufficient documentation

## 2021-06-09 DIAGNOSIS — F03B3 Unspecified dementia, moderate, with mood disturbance: Secondary | ICD-10-CM

## 2021-06-09 NOTE — Progress Notes (Signed)
? ?[name] ?Date of Birth: 1940-03-21 ? ?Reason for Visit: Follow up for memory ?History from: Patient, grandson Lynn Price(Lynn Price) and his wife Lynn Price([place]) ?Primary Neurologist: Dr. Terrace ArabiaYan  ? ?ASSESSMENT AND PLAN ?81 y.o. year old female  ? ?1.  Dementia ?2.  Anxiety, depression ?3.  Recent admission at psychiatric hospital for suicidal ideation and severe depression  ? ?I was able to call the facility [facility], spoke with medical care Director Deanna ArtisKeisha, reports patient has already been seeing in-house psychiatric provider.  They are managing her medications.  She did verify that the Ativan and Aricept were stopped at recent hospitalization.  It was felt the Ativan contributes to her falling.  At this point, she will follow-up at our office on an as-needed basis, she has psychiatric care, along with primary care.  I did communicate this information with her grandson. MMSE 22/30 today. I did try to call her son, Lynn Price twice, I didn't get any answer. ? ?HISTORY  ?Lynn Price, 81 year old female was referred by her primary care physician Dr. Blair Heysobert Ehinger for evaluation of memory trouble. She was last seen in June 2015 by Eber Jonesarolyn for memory loss ?  ?She has a history of fibromyalgia hypertension, anxiety, obstructive sleep apnea using CPAP and short-term memory trouble. She also has a history of bilateral feet paresthesias.  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  She denies significant family history of dementia  ?  ?MRI of the brain 07/11/2011 shows mild changes of chronic microvascular ischemia and generalized cerebral atrophy.   ?  ?The patient continues to drive and has gotten lost in unfamiliar surroundings. She continues to be independent with her activities of daily living. She plays golf intermittently otherwise she gets no regular exercise. She has not had any falls, long-standing history of frequency incontinence and on oxybutynin. Her  Mini-Mental Status exam is stable. She returns for reevaluation ?  ?UPDATE June 6th 2016. ?She is with her husband at today's clinical visit, she continue has mild slow worsening memory trouble, she continued to drive short distances in a familiar route, she visit her friends, go to church regularly, able to clean house, independent at daily activity, ?  ?She also complains of bilateral feet numbness tingling, slow worsening, she is taking gabapentin 300 mg 2 tablets in the morning 3 tablets every night, does make her feel tired and sleepy, ?  ?She has not used her CPAP machine for a while, tends to go to bed late, take long nap in the afternoon, ?  ?She also complains of being fatigued, lack of energy ?  ?UPDATE August 30 2017: ?  ?She is accompanied by her husband at today's clinical visit, she has been followed by our office regularly for memory loss, is referred back by her primary care physician Dr. Beverley FiedlerVictoria  Rankins for evaluation of worsening low back pain, gait abnormality, ?  ?She had history of chronic low back pain, gradually getting worse, radiating pain to left lower extremity, bilateral leg numbness, weakness, left worse than right, has tripped and fell few times, is receiving epidural injection which has helped her low back pain, she also complains of occasional knee bowel and bladder incontinence, frequent urinary urgency. ?  ?Personally reviewed MRI of lumbar in June 23, 2017, there is evidence of multilevel degenerative changes, progression of right-sided disc protrusion L4-5 with expected impingement of right L5 nerve roots, diffuse endplate spurring, right greater than left L5-S1, are  exchanged with subarticular foraminal stenosis, right greater than left ?  ?Update 09/18/2018 SS: Ms. Price is a 81 year old female with history of memory disturbance and bilateral lumbar radiculopathy. ?She presents today for follow-up accompanied by her granddaughter.  She indicates her memory is fair.  She lives  with her husband, she is able to perform her ADLs, manages cooking, finances with her husband.  She has a good appetite. She manages her medications in a weekly pillbox. Occasionally she may miss a dose of medicine.  She is able to drive short distances only.  She recently had eye surgery.  She does not have any family nearby, but her family rotates in and out to visit.  Unfortunately, her husband has developed memory trouble and his is more progressive.  She is having to care for him.  At times she reports she may hear a man on the porch, but knows he is not here.  She does follow with Dr. Cleophas Dunker for chronic back pain, she says she completed physical therapy earlier this year and it was beneficial.  She indicates that her chronic low back pain has improved, she reports some weakness in her legs.  She denies bowel or bladder incontinence.  She has not had an epidural injection in a while, she uses max freeze on her back.  She is considering assisted living for her and her husband. ?  ?Update September 19, 2019 SS: Here today alone, "family falling apart", son got sick-had surgery [place], husband is in memory care. She lives in Independent Living at [facility]" apartment. Is still driving, doing this well. Memory is stable, MMSE 30/30 today. Her health is up and down, ER visits for constipation, dehydration. Has psoriasis in scalp, seeing Dermatology. She manages her ADLs, household affairs, has a lot of paperwork to do related to finances, her sisters helps her with this. Hasn't been able to do much she enjoys (walking, playing games) because busy with stock papers. Takes Seroquel at night for sleep, sleeps well, denies hallucinations. Appetite is good. No falls recently. Has arthritis in her shoulders, doing well with aricept 5 mg, no side effects.  Here today unaccompanied. ? ?Update 12/09/2020 JM: Returns for yearly follow-up accompanied by grandson.  Feels like memory has declined since prior visit. Her husband passed  away 12/10/19 and sister passed away this past 2022-09-09.  Her son lives in [place] who is currently waiting for kidney transplant.  Lives in independent living at [place]. Feels depression/anxiety. Reports social isolation but has been gradually increasing activity at [facility].  She speaks having to do a lot of paperwork throughout the visit.  Her sister used to assist her with this.  She did have a private duty caregiver over the past 1.5 years but has not recently been able to work with her due to her own health issues.  Will drive short distance only. Avoids main roads and high ways. Did get lost while driving on the high way back in 2022/09/09 but no difficulty driving shorter distances.  Appetite good.  Denies any recent falls.  Remains on Aricept 5 mg nightly tolerating without side effects.  MMSE today 22/30 (prior 30/30) with deficits in attention and calculation, recall and stating today is Thursday (its Wednesday) and the date is the 22nd (its the 12th) ?  ?At the end of the visit, grandson mentions that she will call him multiple times throughout the day but will forget that she recently spoke to him and has been perseverating on paperwork.   ?  He then provides a 4 page letter from the caregiver listing specific concerns:  ?1) gotten lost while driving 3 times in all 3 times police have been called to find her while traveling on the highway -she does not believe she should be driving anymore.  ?2) has needed assistance regarding all matters pertaining to her deceased husband's estate and has difficulty keeping track of appointments or what the appointments are for ?3) "great difficulty operating a cell phone including a flip phone even to make a simple phone call.  She calls people over and over again somehow" ?4) careless with checks and money -carrying them around and losing them. ?5) spends hours picking up stacks of nail and putting them down just to revisit them the next day ?6) takes her a long time to get ready to  go anywhere and usually misses or is late for where she is going ?  ?Update June 09, 2021 SS: Dementia panel was normal last visit October 2022.  MRI of the brain in October 2022 showed mild to moderate small

## 2021-06-09 NOTE — Patient Instructions (Signed)
I will call facility to check on psych options for in facility  ?If none, refer to outpatient  ?See you back in 6 months  ? ?

## 2021-06-10 DIAGNOSIS — M797 Fibromyalgia: Secondary | ICD-10-CM | POA: Diagnosis not present

## 2021-06-10 DIAGNOSIS — E785 Hyperlipidemia, unspecified: Secondary | ICD-10-CM | POA: Diagnosis not present

## 2021-06-10 DIAGNOSIS — I10 Essential (primary) hypertension: Secondary | ICD-10-CM | POA: Diagnosis not present

## 2021-06-10 DIAGNOSIS — M170 Bilateral primary osteoarthritis of knee: Secondary | ICD-10-CM | POA: Diagnosis not present

## 2021-06-10 DIAGNOSIS — F0390 Unspecified dementia without behavioral disturbance: Secondary | ICD-10-CM | POA: Insufficient documentation

## 2021-06-14 DIAGNOSIS — G4700 Insomnia, unspecified: Secondary | ICD-10-CM | POA: Diagnosis not present

## 2021-06-14 DIAGNOSIS — M19071 Primary osteoarthritis, right ankle and foot: Secondary | ICD-10-CM | POA: Diagnosis not present

## 2021-06-14 DIAGNOSIS — M19042 Primary osteoarthritis, left hand: Secondary | ICD-10-CM | POA: Diagnosis not present

## 2021-06-14 DIAGNOSIS — G8929 Other chronic pain: Secondary | ICD-10-CM | POA: Diagnosis not present

## 2021-06-14 DIAGNOSIS — Z9181 History of falling: Secondary | ICD-10-CM | POA: Diagnosis not present

## 2021-06-14 DIAGNOSIS — Z79899 Other long term (current) drug therapy: Secondary | ICD-10-CM | POA: Diagnosis not present

## 2021-06-14 DIAGNOSIS — G629 Polyneuropathy, unspecified: Secondary | ICD-10-CM | POA: Diagnosis not present

## 2021-06-14 DIAGNOSIS — G4733 Obstructive sleep apnea (adult) (pediatric): Secondary | ICD-10-CM | POA: Diagnosis not present

## 2021-06-14 DIAGNOSIS — M1712 Unilateral primary osteoarthritis, left knee: Secondary | ICD-10-CM | POA: Diagnosis not present

## 2021-06-14 DIAGNOSIS — M19041 Primary osteoarthritis, right hand: Secondary | ICD-10-CM | POA: Diagnosis not present

## 2021-06-14 DIAGNOSIS — I10 Essential (primary) hypertension: Secondary | ICD-10-CM | POA: Diagnosis not present

## 2021-06-14 DIAGNOSIS — Z87440 Personal history of urinary (tract) infections: Secondary | ICD-10-CM | POA: Diagnosis not present

## 2021-06-14 DIAGNOSIS — M19072 Primary osteoarthritis, left ankle and foot: Secondary | ICD-10-CM | POA: Diagnosis not present

## 2021-06-14 DIAGNOSIS — G2581 Restless legs syndrome: Secondary | ICD-10-CM | POA: Diagnosis not present

## 2021-06-14 DIAGNOSIS — Z96651 Presence of right artificial knee joint: Secondary | ICD-10-CM | POA: Diagnosis not present

## 2021-06-14 DIAGNOSIS — M5416 Radiculopathy, lumbar region: Secondary | ICD-10-CM | POA: Diagnosis not present

## 2021-06-17 DIAGNOSIS — E785 Hyperlipidemia, unspecified: Secondary | ICD-10-CM | POA: Diagnosis not present

## 2021-06-17 DIAGNOSIS — D508 Other iron deficiency anemias: Secondary | ICD-10-CM | POA: Diagnosis not present

## 2021-06-22 DIAGNOSIS — M1712 Unilateral primary osteoarthritis, left knee: Secondary | ICD-10-CM | POA: Diagnosis not present

## 2021-06-22 DIAGNOSIS — G8929 Other chronic pain: Secondary | ICD-10-CM | POA: Diagnosis not present

## 2021-06-22 DIAGNOSIS — Z96651 Presence of right artificial knee joint: Secondary | ICD-10-CM | POA: Diagnosis not present

## 2021-06-22 DIAGNOSIS — M19042 Primary osteoarthritis, left hand: Secondary | ICD-10-CM | POA: Diagnosis not present

## 2021-06-22 DIAGNOSIS — M5416 Radiculopathy, lumbar region: Secondary | ICD-10-CM | POA: Diagnosis not present

## 2021-06-22 DIAGNOSIS — G2581 Restless legs syndrome: Secondary | ICD-10-CM | POA: Diagnosis not present

## 2021-06-22 DIAGNOSIS — M19071 Primary osteoarthritis, right ankle and foot: Secondary | ICD-10-CM | POA: Diagnosis not present

## 2021-06-22 DIAGNOSIS — G4700 Insomnia, unspecified: Secondary | ICD-10-CM | POA: Diagnosis not present

## 2021-06-22 DIAGNOSIS — G4733 Obstructive sleep apnea (adult) (pediatric): Secondary | ICD-10-CM | POA: Diagnosis not present

## 2021-06-22 DIAGNOSIS — Z9181 History of falling: Secondary | ICD-10-CM | POA: Diagnosis not present

## 2021-06-22 DIAGNOSIS — G629 Polyneuropathy, unspecified: Secondary | ICD-10-CM | POA: Diagnosis not present

## 2021-06-22 DIAGNOSIS — M19041 Primary osteoarthritis, right hand: Secondary | ICD-10-CM | POA: Diagnosis not present

## 2021-06-22 DIAGNOSIS — Z79899 Other long term (current) drug therapy: Secondary | ICD-10-CM | POA: Diagnosis not present

## 2021-06-22 DIAGNOSIS — I10 Essential (primary) hypertension: Secondary | ICD-10-CM | POA: Diagnosis not present

## 2021-06-22 DIAGNOSIS — M19072 Primary osteoarthritis, left ankle and foot: Secondary | ICD-10-CM | POA: Diagnosis not present

## 2021-06-22 DIAGNOSIS — Z87440 Personal history of urinary (tract) infections: Secondary | ICD-10-CM | POA: Diagnosis not present

## 2021-06-24 ENCOUNTER — Emergency Department (HOSPITAL_COMMUNITY)
Admission: EM | Admit: 2021-06-24 | Discharge: 2021-06-24 | Disposition: A | Discharge status: Home / Self Care | Payer: Medicare Other | Attending: Student | Admitting: Student

## 2021-06-24 ENCOUNTER — Other Ambulatory Visit: Payer: Self-pay

## 2021-06-24 ENCOUNTER — Encounter (HOSPITAL_COMMUNITY): Payer: Self-pay

## 2021-06-24 DIAGNOSIS — M79661 Pain in right lower leg: Secondary | ICD-10-CM | POA: Insufficient documentation

## 2021-06-24 DIAGNOSIS — M79672 Pain in left foot: Secondary | ICD-10-CM | POA: Insufficient documentation

## 2021-06-24 DIAGNOSIS — F0390 Unspecified dementia without behavioral disturbance: Secondary | ICD-10-CM | POA: Insufficient documentation

## 2021-06-24 DIAGNOSIS — M79671 Pain in right foot: Secondary | ICD-10-CM | POA: Insufficient documentation

## 2021-06-24 DIAGNOSIS — F419 Anxiety disorder, unspecified: Secondary | ICD-10-CM | POA: Diagnosis not present

## 2021-06-24 DIAGNOSIS — M79662 Pain in left lower leg: Secondary | ICD-10-CM | POA: Insufficient documentation

## 2021-06-24 DIAGNOSIS — Z743 Need for continuous supervision: Secondary | ICD-10-CM | POA: Diagnosis not present

## 2021-06-24 DIAGNOSIS — N183 Chronic kidney disease, stage 3 unspecified: Secondary | ICD-10-CM | POA: Diagnosis not present

## 2021-06-24 DIAGNOSIS — M79604 Pain in right leg: Secondary | ICD-10-CM

## 2021-06-24 MED ORDER — GABAPENTIN 100 MG PO CAPS
200.0000 mg | ORAL_CAPSULE | Freq: Two times a day (BID) | ORAL | 0 refills | Status: AC
Start: 2021-06-24 — End: 2021-07-24

## 2021-06-24 MED ORDER — LORAZEPAM 1 MG PO TABS
1.0000 mg | ORAL_TABLET | Freq: Once | ORAL | Status: AC
  Administered 2021-06-24: 1 mg via ORAL
  Filled 2021-06-24: qty 1

## 2021-06-24 NOTE — ED Triage Notes (Signed)
Pt BIB [facility], pt very tearful and anxious, called out c/o bilateral foot pain radiating up to hips. Hx neuropathy, takes meds, pt states she wonders if she needs something stronger. Ambulatory with EMS.  ?

## 2021-06-24 NOTE — ED Provider Notes (Signed)
?[facility] ?Provider Note ? ? ?CSN: [id number] ?Arrival date & time: 06/24/21  1543 ? ?  ? ?History ? ?Chief Complaint  ?Patient presents with  ? Foot Pain  ? ? ?Lynn Price is a 81 y.o. female.  Patient presents to the emergency department via EMS complaining of increased bilateral lower leg pain.  Patient has history of both neuropathy and fibromyalgia.  Patient states that the pain seems like an increase in her underlying pain.  Patient denies falling or recent injury.  Patient states she cooks breakfast this morning and went to have her hair cut and when she returned she began to have increased pain.  Patient denies headache, chest pain, shortness of breath.  [facility]el history significant for memory loss, pseudogout, osteoarthritis of the knees hands and feet, fibromyalgia, peripheral neuropathy, chronic left-sided low back pain with left-sided sciatica, lumbar radiculopathy, depression, GERD, CKD stage III, adjustment disorder with depressed mood, anxiety, dementia ? ?HPI ? ?  ? ?Home Medications ?Prior to Admission medications   ?Medication Sig Start Date End Date Taking? Authorizing Provider  ?acetaminophen (TYLENOL) 325 MG tablet Take 325 mg by mouth See admin instructions. Take one tablet (325 mg) by mouth twice daily; may also take one tablet (325 mg) every 6 hours as needed for pain    [provider]  ?acyclovir (ZOVIRAX) 400 MG tablet Take 400 mg by mouth 2 (two) times daily.     [provider]  ?diltiazem (DILACOR XR) 120 MG 24 hr capsule Take 120 mg by mouth daily.    [provider]  ?docusate sodium (COLACE) 100 MG capsule Take 1 capsule (100 mg total) by mouth every 12 (twelve) hours. 04/04/21   Mesner, Corene Cornea, MD  ?DULoxetine (CYMBALTA) 20 MG capsule Take 1 capsule (20 mg total) by mouth 2 (two) times daily. 05/28/21   Antonieta Pert, MD  ?gabapentin (NEURONTIN) 100 MG capsule Take 2 capsules (200 mg total) by mouth 2 (two) times daily.  06/24/21 07/24/21  Dorothyann Peng, PA-C  ?LORazepam (ATIVAN) 0.5 MG tablet Take 0.5 tablets (0.25 mg total) by mouth 3 (three) times daily as needed for anxiety. ?Patient not taking: Reported on 06/09/2021 05/27/21   Antonieta Pert, MD  ?Melatonin 10 MG CAPS Take 10 mg by mouth at bedtime.    [provider]  ?Omega-3 Fatty Acids (FISH OIL) 1200 MG CAPS Take 1,200 mg by mouth every morning.    [provider]  ?ondansetron (ZOFRAN-ODT) 4 MG disintegrating tablet 4mg  ODT q4 hours prn nausea/vomit ?Patient taking differently: 4 mg every 4 (four) hours as needed for nausea or vomiting. 04/04/21   Mesner, Corene Cornea, MD  ?polyethylene glycol (MIRALAX / GLYCOLAX) 17 g packet Take 17 g by mouth daily. 04/04/21   Mesner, Corene Cornea, MD  ?   ? ?Allergies    ?Erythromycin, Penicillins, Sulfa antibiotics, and Tetracyclines & related   ? ?Review of Systems   ?Review of Systems  ?Constitutional:  Negative for fever.  ?Respiratory:  Negative for shortness of breath.   ?Cardiovascular:  Negative for chest pain.  ?Gastrointestinal:  Negative for abdominal pain and nausea.  ?Musculoskeletal:  Positive for myalgias.  ?     Pain in bilateral feet up through bilateral upper legs  ?Neurological:  Negative for weakness and numbness.  ? ?Physical Exam ?Updated Vital Signs ?BP (!) 150/83   Pulse 75   Temp 98 ?F (36.7 ?C) (Oral)   Resp (!) 23   Ht 5\' 7"  (1.702  m)   Wt 49.9 kg   SpO2 100%   BMI 17.23 kg/m?  ?Physical Exam ?Vitals and nursing note reviewed.  ?HENT:  ?   Head: Normocephalic and atraumatic.  ?Eyes:  ?   Conjunctiva/sclera: Conjunctivae normal.  ?Cardiovascular:  ?   Rate and Rhythm: Normal rate and regular rhythm.  ?   Pulses: Normal pulses.  ?   Heart sounds: Normal heart sounds.  ?Pulmonary:  ?   Effort: Pulmonary effort is normal.  ?   Breath sounds: Normal breath sounds.  ?Musculoskeletal:     ?   General: No tenderness. Normal range of motion.  ?   Cervical back: Normal range of motion.  ?   Comments: Patient has  normal strength and range of motion in her lower legs.  No tenderness to palpation.  ?Skin: ?   General: Skin is warm and dry.  ?   Capillary Refill: Capillary refill takes less than 2 seconds.  ?Neurological:  ?   Mental Status: She is alert.  ?   Sensory: Sensation is intact.  ?   Motor: Motor function is intact.  ?   Coordination: Coordination is intact.  ?   Comments: CN II through 7, 11, 12 intact  ?Psychiatric:  ?   Comments: Patient appears anxious  ? ? ?ED Results / Procedures / Treatments   ?Labs ?(all labs ordered are listed, but only abnormal results are displayed) ?Labs Reviewed - No data to display ? ?EKG ?None ? ?Radiology ?No results found. ? ?Procedures ?Procedures  ? ?Medications Ordered in ED ?Medications - No data to display ? ?ED Course/ Medical Decision Making/ A&P ?  ?                        ?Medical Decision Making ? ?The patient is with a chief complaint of bilateral foot pain and bilateral leg pain.  Differential includes but is not limited to fracture, muscle strain, sprains, neuropathy ? ?The patient has no recent falls.  No injury to suggest fracture.  Bilateral muscle strain or sprain that occurred at the same time with no known injury would be unlikely.  No neurodeficit noted ? ?This is likely a worsening of the patient's neuropathy.  Plan on discharge with increase in gabapentin dosage.  Recommend follow-up with outpatient neurology for further evaluation.  Patient states she has a neurologist. ? ? ?Final Clinical Impression(s) / ED Diagnoses ?Final diagnoses:  ?Foot pain, right  ?Foot pain, left  ?Pain in both lower extremities  ? ? ?Rx / DC Orders ?ED Discharge Orders   ? ?      Ordered  ?  gabapentin (NEURONTIN) 100 MG capsule  2 times daily       ? 06/24/21 1633  ? ?  ?  ? ?  ? ? ?  ?Dorothyann Peng, PA-C ?06/24/21 1634 ? ?  ?Teressa Lower, MD ?06/25/21 [phone] ? ?

## 2021-06-24 NOTE — Discharge Instructions (Signed)
You were seen today for bilateral foot and leg pain.  This appears to be a worsening of your underlying neuropathy.  I have discharged with an increase dosage of your gabapentin.  I recommend follow-up with either primary care or neurology for further evaluation and management. ?

## 2021-06-25 DIAGNOSIS — M19041 Primary osteoarthritis, right hand: Secondary | ICD-10-CM | POA: Diagnosis not present

## 2021-06-25 DIAGNOSIS — M19071 Primary osteoarthritis, right ankle and foot: Secondary | ICD-10-CM | POA: Diagnosis not present

## 2021-06-25 DIAGNOSIS — Z9181 History of falling: Secondary | ICD-10-CM | POA: Diagnosis not present

## 2021-06-25 DIAGNOSIS — I10 Essential (primary) hypertension: Secondary | ICD-10-CM | POA: Diagnosis not present

## 2021-06-25 DIAGNOSIS — G4733 Obstructive sleep apnea (adult) (pediatric): Secondary | ICD-10-CM | POA: Diagnosis not present

## 2021-06-25 DIAGNOSIS — M1712 Unilateral primary osteoarthritis, left knee: Secondary | ICD-10-CM | POA: Diagnosis not present

## 2021-06-25 DIAGNOSIS — M5416 Radiculopathy, lumbar region: Secondary | ICD-10-CM | POA: Diagnosis not present

## 2021-06-25 DIAGNOSIS — G8929 Other chronic pain: Secondary | ICD-10-CM | POA: Diagnosis not present

## 2021-06-25 DIAGNOSIS — Z79899 Other long term (current) drug therapy: Secondary | ICD-10-CM | POA: Diagnosis not present

## 2021-06-25 DIAGNOSIS — Z96651 Presence of right artificial knee joint: Secondary | ICD-10-CM | POA: Diagnosis not present

## 2021-06-25 DIAGNOSIS — M19042 Primary osteoarthritis, left hand: Secondary | ICD-10-CM | POA: Diagnosis not present

## 2021-06-25 DIAGNOSIS — M19072 Primary osteoarthritis, left ankle and foot: Secondary | ICD-10-CM | POA: Diagnosis not present

## 2021-06-25 DIAGNOSIS — G4700 Insomnia, unspecified: Secondary | ICD-10-CM | POA: Diagnosis not present

## 2021-06-25 DIAGNOSIS — G629 Polyneuropathy, unspecified: Secondary | ICD-10-CM | POA: Diagnosis not present

## 2021-06-25 DIAGNOSIS — Z87440 Personal history of urinary (tract) infections: Secondary | ICD-10-CM | POA: Diagnosis not present

## 2021-06-25 DIAGNOSIS — G2581 Restless legs syndrome: Secondary | ICD-10-CM | POA: Diagnosis not present

## 2021-06-26 DIAGNOSIS — G629 Polyneuropathy, unspecified: Secondary | ICD-10-CM | POA: Diagnosis not present

## 2021-06-26 DIAGNOSIS — Z0189 Encounter for other specified special examinations: Secondary | ICD-10-CM | POA: Diagnosis not present

## 2021-06-26 DIAGNOSIS — G4700 Insomnia, unspecified: Secondary | ICD-10-CM | POA: Diagnosis not present

## 2021-06-29 DIAGNOSIS — M5416 Radiculopathy, lumbar region: Secondary | ICD-10-CM | POA: Diagnosis not present

## 2021-06-29 DIAGNOSIS — G8929 Other chronic pain: Secondary | ICD-10-CM | POA: Diagnosis not present

## 2021-06-29 DIAGNOSIS — Z96651 Presence of right artificial knee joint: Secondary | ICD-10-CM | POA: Diagnosis not present

## 2021-06-29 DIAGNOSIS — M1712 Unilateral primary osteoarthritis, left knee: Secondary | ICD-10-CM | POA: Diagnosis not present

## 2021-06-29 DIAGNOSIS — Z9181 History of falling: Secondary | ICD-10-CM | POA: Diagnosis not present

## 2021-06-29 DIAGNOSIS — G629 Polyneuropathy, unspecified: Secondary | ICD-10-CM | POA: Diagnosis not present

## 2021-06-29 DIAGNOSIS — I10 Essential (primary) hypertension: Secondary | ICD-10-CM | POA: Diagnosis not present

## 2021-06-29 DIAGNOSIS — Z87440 Personal history of urinary (tract) infections: Secondary | ICD-10-CM | POA: Diagnosis not present

## 2021-06-29 DIAGNOSIS — G4733 Obstructive sleep apnea (adult) (pediatric): Secondary | ICD-10-CM | POA: Diagnosis not present

## 2021-06-29 DIAGNOSIS — M19041 Primary osteoarthritis, right hand: Secondary | ICD-10-CM | POA: Diagnosis not present

## 2021-06-29 DIAGNOSIS — Z79899 Other long term (current) drug therapy: Secondary | ICD-10-CM | POA: Diagnosis not present

## 2021-06-29 DIAGNOSIS — M19072 Primary osteoarthritis, left ankle and foot: Secondary | ICD-10-CM | POA: Diagnosis not present

## 2021-06-29 DIAGNOSIS — G2581 Restless legs syndrome: Secondary | ICD-10-CM | POA: Diagnosis not present

## 2021-06-29 DIAGNOSIS — M19071 Primary osteoarthritis, right ankle and foot: Secondary | ICD-10-CM | POA: Diagnosis not present

## 2021-06-29 DIAGNOSIS — G4700 Insomnia, unspecified: Secondary | ICD-10-CM | POA: Diagnosis not present

## 2021-06-29 DIAGNOSIS — M19042 Primary osteoarthritis, left hand: Secondary | ICD-10-CM | POA: Diagnosis not present

## 2021-07-02 DIAGNOSIS — G629 Polyneuropathy, unspecified: Secondary | ICD-10-CM | POA: Diagnosis not present

## 2021-07-02 DIAGNOSIS — Z9181 History of falling: Secondary | ICD-10-CM | POA: Diagnosis not present

## 2021-07-02 DIAGNOSIS — M19042 Primary osteoarthritis, left hand: Secondary | ICD-10-CM | POA: Diagnosis not present

## 2021-07-02 DIAGNOSIS — G4733 Obstructive sleep apnea (adult) (pediatric): Secondary | ICD-10-CM | POA: Diagnosis not present

## 2021-07-02 DIAGNOSIS — Z79899 Other long term (current) drug therapy: Secondary | ICD-10-CM | POA: Diagnosis not present

## 2021-07-02 DIAGNOSIS — G8929 Other chronic pain: Secondary | ICD-10-CM | POA: Diagnosis not present

## 2021-07-02 DIAGNOSIS — G2581 Restless legs syndrome: Secondary | ICD-10-CM | POA: Diagnosis not present

## 2021-07-02 DIAGNOSIS — M19072 Primary osteoarthritis, left ankle and foot: Secondary | ICD-10-CM | POA: Diagnosis not present

## 2021-07-02 DIAGNOSIS — M19071 Primary osteoarthritis, right ankle and foot: Secondary | ICD-10-CM | POA: Diagnosis not present

## 2021-07-02 DIAGNOSIS — G4700 Insomnia, unspecified: Secondary | ICD-10-CM | POA: Diagnosis not present

## 2021-07-02 DIAGNOSIS — M5416 Radiculopathy, lumbar region: Secondary | ICD-10-CM | POA: Diagnosis not present

## 2021-07-02 DIAGNOSIS — Z96651 Presence of right artificial knee joint: Secondary | ICD-10-CM | POA: Diagnosis not present

## 2021-07-02 DIAGNOSIS — M1712 Unilateral primary osteoarthritis, left knee: Secondary | ICD-10-CM | POA: Diagnosis not present

## 2021-07-02 DIAGNOSIS — Z87440 Personal history of urinary (tract) infections: Secondary | ICD-10-CM | POA: Diagnosis not present

## 2021-07-02 DIAGNOSIS — I10 Essential (primary) hypertension: Secondary | ICD-10-CM | POA: Diagnosis not present

## 2021-07-02 DIAGNOSIS — M19041 Primary osteoarthritis, right hand: Secondary | ICD-10-CM | POA: Diagnosis not present

## 2021-07-06 DIAGNOSIS — Z87440 Personal history of urinary (tract) infections: Secondary | ICD-10-CM | POA: Diagnosis not present

## 2021-07-06 DIAGNOSIS — M19041 Primary osteoarthritis, right hand: Secondary | ICD-10-CM | POA: Diagnosis not present

## 2021-07-06 DIAGNOSIS — G629 Polyneuropathy, unspecified: Secondary | ICD-10-CM | POA: Diagnosis not present

## 2021-07-06 DIAGNOSIS — G4733 Obstructive sleep apnea (adult) (pediatric): Secondary | ICD-10-CM | POA: Diagnosis not present

## 2021-07-06 DIAGNOSIS — M19042 Primary osteoarthritis, left hand: Secondary | ICD-10-CM | POA: Diagnosis not present

## 2021-07-06 DIAGNOSIS — Z9181 History of falling: Secondary | ICD-10-CM | POA: Diagnosis not present

## 2021-07-06 DIAGNOSIS — M19071 Primary osteoarthritis, right ankle and foot: Secondary | ICD-10-CM | POA: Diagnosis not present

## 2021-07-06 DIAGNOSIS — I10 Essential (primary) hypertension: Secondary | ICD-10-CM | POA: Diagnosis not present

## 2021-07-06 DIAGNOSIS — M1712 Unilateral primary osteoarthritis, left knee: Secondary | ICD-10-CM | POA: Diagnosis not present

## 2021-07-06 DIAGNOSIS — Z79899 Other long term (current) drug therapy: Secondary | ICD-10-CM | POA: Diagnosis not present

## 2021-07-06 DIAGNOSIS — M19072 Primary osteoarthritis, left ankle and foot: Secondary | ICD-10-CM | POA: Diagnosis not present

## 2021-07-06 DIAGNOSIS — Z96651 Presence of right artificial knee joint: Secondary | ICD-10-CM | POA: Diagnosis not present

## 2021-07-06 DIAGNOSIS — G4700 Insomnia, unspecified: Secondary | ICD-10-CM | POA: Diagnosis not present

## 2021-07-06 DIAGNOSIS — G2581 Restless legs syndrome: Secondary | ICD-10-CM | POA: Diagnosis not present

## 2021-07-06 DIAGNOSIS — M5416 Radiculopathy, lumbar region: Secondary | ICD-10-CM | POA: Diagnosis not present

## 2021-07-06 DIAGNOSIS — G8929 Other chronic pain: Secondary | ICD-10-CM | POA: Diagnosis not present

## 2021-07-08 DIAGNOSIS — I10 Essential (primary) hypertension: Secondary | ICD-10-CM | POA: Diagnosis not present

## 2021-07-08 DIAGNOSIS — Z9181 History of falling: Secondary | ICD-10-CM | POA: Diagnosis not present

## 2021-07-08 DIAGNOSIS — M19041 Primary osteoarthritis, right hand: Secondary | ICD-10-CM | POA: Diagnosis not present

## 2021-07-08 DIAGNOSIS — G4733 Obstructive sleep apnea (adult) (pediatric): Secondary | ICD-10-CM | POA: Diagnosis not present

## 2021-07-08 DIAGNOSIS — G8929 Other chronic pain: Secondary | ICD-10-CM | POA: Diagnosis not present

## 2021-07-08 DIAGNOSIS — Z87440 Personal history of urinary (tract) infections: Secondary | ICD-10-CM | POA: Diagnosis not present

## 2021-07-08 DIAGNOSIS — G4700 Insomnia, unspecified: Secondary | ICD-10-CM | POA: Diagnosis not present

## 2021-07-08 DIAGNOSIS — Z96651 Presence of right artificial knee joint: Secondary | ICD-10-CM | POA: Diagnosis not present

## 2021-07-08 DIAGNOSIS — G2581 Restless legs syndrome: Secondary | ICD-10-CM | POA: Diagnosis not present

## 2021-07-08 DIAGNOSIS — M5416 Radiculopathy, lumbar region: Secondary | ICD-10-CM | POA: Diagnosis not present

## 2021-07-08 DIAGNOSIS — Z79899 Other long term (current) drug therapy: Secondary | ICD-10-CM | POA: Diagnosis not present

## 2021-07-08 DIAGNOSIS — M19042 Primary osteoarthritis, left hand: Secondary | ICD-10-CM | POA: Diagnosis not present

## 2021-07-08 DIAGNOSIS — M19072 Primary osteoarthritis, left ankle and foot: Secondary | ICD-10-CM | POA: Diagnosis not present

## 2021-07-08 DIAGNOSIS — M1712 Unilateral primary osteoarthritis, left knee: Secondary | ICD-10-CM | POA: Diagnosis not present

## 2021-07-08 DIAGNOSIS — M19071 Primary osteoarthritis, right ankle and foot: Secondary | ICD-10-CM | POA: Diagnosis not present

## 2021-07-08 DIAGNOSIS — G629 Polyneuropathy, unspecified: Secondary | ICD-10-CM | POA: Diagnosis not present

## 2021-07-13 DIAGNOSIS — M5416 Radiculopathy, lumbar region: Secondary | ICD-10-CM | POA: Diagnosis not present

## 2021-07-13 DIAGNOSIS — G8929 Other chronic pain: Secondary | ICD-10-CM | POA: Diagnosis not present

## 2021-07-13 DIAGNOSIS — M19072 Primary osteoarthritis, left ankle and foot: Secondary | ICD-10-CM | POA: Diagnosis not present

## 2021-07-13 DIAGNOSIS — Z79899 Other long term (current) drug therapy: Secondary | ICD-10-CM | POA: Diagnosis not present

## 2021-07-13 DIAGNOSIS — G4700 Insomnia, unspecified: Secondary | ICD-10-CM | POA: Diagnosis not present

## 2021-07-13 DIAGNOSIS — M1712 Unilateral primary osteoarthritis, left knee: Secondary | ICD-10-CM | POA: Diagnosis not present

## 2021-07-13 DIAGNOSIS — I10 Essential (primary) hypertension: Secondary | ICD-10-CM | POA: Diagnosis not present

## 2021-07-13 DIAGNOSIS — Z9181 History of falling: Secondary | ICD-10-CM | POA: Diagnosis not present

## 2021-07-13 DIAGNOSIS — G4733 Obstructive sleep apnea (adult) (pediatric): Secondary | ICD-10-CM | POA: Diagnosis not present

## 2021-07-13 DIAGNOSIS — Z96651 Presence of right artificial knee joint: Secondary | ICD-10-CM | POA: Diagnosis not present

## 2021-07-13 DIAGNOSIS — Z87440 Personal history of urinary (tract) infections: Secondary | ICD-10-CM | POA: Diagnosis not present

## 2021-07-13 DIAGNOSIS — G2581 Restless legs syndrome: Secondary | ICD-10-CM | POA: Diagnosis not present

## 2021-07-13 DIAGNOSIS — M19041 Primary osteoarthritis, right hand: Secondary | ICD-10-CM | POA: Diagnosis not present

## 2021-07-13 DIAGNOSIS — M19042 Primary osteoarthritis, left hand: Secondary | ICD-10-CM | POA: Diagnosis not present

## 2021-07-13 DIAGNOSIS — M19071 Primary osteoarthritis, right ankle and foot: Secondary | ICD-10-CM | POA: Diagnosis not present

## 2021-07-13 DIAGNOSIS — G629 Polyneuropathy, unspecified: Secondary | ICD-10-CM | POA: Diagnosis not present

## 2021-07-15 DIAGNOSIS — G629 Polyneuropathy, unspecified: Secondary | ICD-10-CM | POA: Diagnosis not present

## 2021-07-15 DIAGNOSIS — Z9181 History of falling: Secondary | ICD-10-CM | POA: Diagnosis not present

## 2021-07-15 DIAGNOSIS — M5416 Radiculopathy, lumbar region: Secondary | ICD-10-CM | POA: Diagnosis not present

## 2021-07-15 DIAGNOSIS — M19041 Primary osteoarthritis, right hand: Secondary | ICD-10-CM | POA: Diagnosis not present

## 2021-07-15 DIAGNOSIS — G4733 Obstructive sleep apnea (adult) (pediatric): Secondary | ICD-10-CM | POA: Diagnosis not present

## 2021-07-15 DIAGNOSIS — M19072 Primary osteoarthritis, left ankle and foot: Secondary | ICD-10-CM | POA: Diagnosis not present

## 2021-07-15 DIAGNOSIS — M19071 Primary osteoarthritis, right ankle and foot: Secondary | ICD-10-CM | POA: Diagnosis not present

## 2021-07-15 DIAGNOSIS — Z79899 Other long term (current) drug therapy: Secondary | ICD-10-CM | POA: Diagnosis not present

## 2021-07-15 DIAGNOSIS — G2581 Restless legs syndrome: Secondary | ICD-10-CM | POA: Diagnosis not present

## 2021-07-15 DIAGNOSIS — Z96651 Presence of right artificial knee joint: Secondary | ICD-10-CM | POA: Diagnosis not present

## 2021-07-15 DIAGNOSIS — M1712 Unilateral primary osteoarthritis, left knee: Secondary | ICD-10-CM | POA: Diagnosis not present

## 2021-07-15 DIAGNOSIS — Z87440 Personal history of urinary (tract) infections: Secondary | ICD-10-CM | POA: Diagnosis not present

## 2021-07-15 DIAGNOSIS — M19042 Primary osteoarthritis, left hand: Secondary | ICD-10-CM | POA: Diagnosis not present

## 2021-07-15 DIAGNOSIS — G8929 Other chronic pain: Secondary | ICD-10-CM | POA: Diagnosis not present

## 2021-07-15 DIAGNOSIS — G4700 Insomnia, unspecified: Secondary | ICD-10-CM | POA: Diagnosis not present

## 2021-07-15 DIAGNOSIS — I10 Essential (primary) hypertension: Secondary | ICD-10-CM | POA: Diagnosis not present

## 2021-07-20 DIAGNOSIS — M19072 Primary osteoarthritis, left ankle and foot: Secondary | ICD-10-CM | POA: Diagnosis not present

## 2021-07-20 DIAGNOSIS — Z87440 Personal history of urinary (tract) infections: Secondary | ICD-10-CM | POA: Diagnosis not present

## 2021-07-20 DIAGNOSIS — M1712 Unilateral primary osteoarthritis, left knee: Secondary | ICD-10-CM | POA: Diagnosis not present

## 2021-07-20 DIAGNOSIS — G4700 Insomnia, unspecified: Secondary | ICD-10-CM | POA: Diagnosis not present

## 2021-07-20 DIAGNOSIS — I10 Essential (primary) hypertension: Secondary | ICD-10-CM | POA: Diagnosis not present

## 2021-07-20 DIAGNOSIS — G629 Polyneuropathy, unspecified: Secondary | ICD-10-CM | POA: Diagnosis not present

## 2021-07-20 DIAGNOSIS — M5416 Radiculopathy, lumbar region: Secondary | ICD-10-CM | POA: Diagnosis not present

## 2021-07-20 DIAGNOSIS — Z9181 History of falling: Secondary | ICD-10-CM | POA: Diagnosis not present

## 2021-07-20 DIAGNOSIS — G8929 Other chronic pain: Secondary | ICD-10-CM | POA: Diagnosis not present

## 2021-07-20 DIAGNOSIS — G4733 Obstructive sleep apnea (adult) (pediatric): Secondary | ICD-10-CM | POA: Diagnosis not present

## 2021-07-20 DIAGNOSIS — M19042 Primary osteoarthritis, left hand: Secondary | ICD-10-CM | POA: Diagnosis not present

## 2021-07-20 DIAGNOSIS — Z96651 Presence of right artificial knee joint: Secondary | ICD-10-CM | POA: Diagnosis not present

## 2021-07-20 DIAGNOSIS — M19071 Primary osteoarthritis, right ankle and foot: Secondary | ICD-10-CM | POA: Diagnosis not present

## 2021-07-20 DIAGNOSIS — Z79899 Other long term (current) drug therapy: Secondary | ICD-10-CM | POA: Diagnosis not present

## 2021-07-20 DIAGNOSIS — M19041 Primary osteoarthritis, right hand: Secondary | ICD-10-CM | POA: Diagnosis not present

## 2021-07-20 DIAGNOSIS — G2581 Restless legs syndrome: Secondary | ICD-10-CM | POA: Diagnosis not present

## 2021-07-22 DIAGNOSIS — E785 Hyperlipidemia, unspecified: Secondary | ICD-10-CM | POA: Diagnosis not present

## 2021-07-22 DIAGNOSIS — F32A Depression, unspecified: Secondary | ICD-10-CM | POA: Diagnosis not present

## 2021-07-22 DIAGNOSIS — I10 Essential (primary) hypertension: Secondary | ICD-10-CM | POA: Diagnosis not present

## 2021-07-22 DIAGNOSIS — G4709 Other insomnia: Secondary | ICD-10-CM | POA: Diagnosis not present

## 2021-07-22 DIAGNOSIS — G64 Other disorders of peripheral nervous system: Secondary | ICD-10-CM | POA: Diagnosis not present

## 2021-07-22 DIAGNOSIS — K5909 Other constipation: Secondary | ICD-10-CM | POA: Diagnosis not present

## 2021-07-22 DIAGNOSIS — R2689 Other abnormalities of gait and mobility: Secondary | ICD-10-CM | POA: Diagnosis not present

## 2021-07-27 DIAGNOSIS — Z79899 Other long term (current) drug therapy: Secondary | ICD-10-CM | POA: Diagnosis not present

## 2021-07-27 DIAGNOSIS — M1712 Unilateral primary osteoarthritis, left knee: Secondary | ICD-10-CM | POA: Diagnosis not present

## 2021-07-27 DIAGNOSIS — Z9181 History of falling: Secondary | ICD-10-CM | POA: Diagnosis not present

## 2021-07-27 DIAGNOSIS — G629 Polyneuropathy, unspecified: Secondary | ICD-10-CM | POA: Diagnosis not present

## 2021-07-27 DIAGNOSIS — M19072 Primary osteoarthritis, left ankle and foot: Secondary | ICD-10-CM | POA: Diagnosis not present

## 2021-07-27 DIAGNOSIS — G8929 Other chronic pain: Secondary | ICD-10-CM | POA: Diagnosis not present

## 2021-07-27 DIAGNOSIS — M19041 Primary osteoarthritis, right hand: Secondary | ICD-10-CM | POA: Diagnosis not present

## 2021-07-27 DIAGNOSIS — M5416 Radiculopathy, lumbar region: Secondary | ICD-10-CM | POA: Diagnosis not present

## 2021-07-27 DIAGNOSIS — G4700 Insomnia, unspecified: Secondary | ICD-10-CM | POA: Diagnosis not present

## 2021-07-27 DIAGNOSIS — Z87440 Personal history of urinary (tract) infections: Secondary | ICD-10-CM | POA: Diagnosis not present

## 2021-07-27 DIAGNOSIS — Z96651 Presence of right artificial knee joint: Secondary | ICD-10-CM | POA: Diagnosis not present

## 2021-07-27 DIAGNOSIS — M19042 Primary osteoarthritis, left hand: Secondary | ICD-10-CM | POA: Diagnosis not present

## 2021-07-27 DIAGNOSIS — M19071 Primary osteoarthritis, right ankle and foot: Secondary | ICD-10-CM | POA: Diagnosis not present

## 2021-07-27 DIAGNOSIS — G4733 Obstructive sleep apnea (adult) (pediatric): Secondary | ICD-10-CM | POA: Diagnosis not present

## 2021-07-27 DIAGNOSIS — G2581 Restless legs syndrome: Secondary | ICD-10-CM | POA: Diagnosis not present

## 2021-07-27 DIAGNOSIS — I10 Essential (primary) hypertension: Secondary | ICD-10-CM | POA: Diagnosis not present

## 2021-08-23 DIAGNOSIS — R296 Repeated falls: Secondary | ICD-10-CM | POA: Diagnosis not present

## 2021-08-23 DIAGNOSIS — G4700 Insomnia, unspecified: Secondary | ICD-10-CM | POA: Diagnosis not present

## 2021-08-23 DIAGNOSIS — G5793 Unspecified mononeuropathy of bilateral lower limbs: Secondary | ICD-10-CM | POA: Diagnosis not present

## 2021-08-23 DIAGNOSIS — G4733 Obstructive sleep apnea (adult) (pediatric): Secondary | ICD-10-CM | POA: Diagnosis not present

## 2021-08-23 DIAGNOSIS — M797 Fibromyalgia: Secondary | ICD-10-CM | POA: Diagnosis not present

## 2021-08-23 DIAGNOSIS — G64 Other disorders of peripheral nervous system: Secondary | ICD-10-CM | POA: Diagnosis not present

## 2021-08-23 DIAGNOSIS — F32A Depression, unspecified: Secondary | ICD-10-CM | POA: Diagnosis not present

## 2021-08-23 DIAGNOSIS — E785 Hyperlipidemia, unspecified: Secondary | ICD-10-CM | POA: Diagnosis not present

## 2021-08-23 DIAGNOSIS — I10 Essential (primary) hypertension: Secondary | ICD-10-CM | POA: Diagnosis not present

## 2021-08-23 DIAGNOSIS — K5900 Constipation, unspecified: Secondary | ICD-10-CM | POA: Diagnosis not present

## 2021-08-23 DIAGNOSIS — G629 Polyneuropathy, unspecified: Secondary | ICD-10-CM | POA: Diagnosis not present

## 2021-08-23 DIAGNOSIS — R269 Unspecified abnormalities of gait and mobility: Secondary | ICD-10-CM | POA: Diagnosis not present

## 2021-08-23 DIAGNOSIS — M1990 Unspecified osteoarthritis, unspecified site: Secondary | ICD-10-CM | POA: Diagnosis not present

## 2021-08-25 DIAGNOSIS — R269 Unspecified abnormalities of gait and mobility: Secondary | ICD-10-CM | POA: Diagnosis not present

## 2021-08-25 DIAGNOSIS — G64 Other disorders of peripheral nervous system: Secondary | ICD-10-CM | POA: Diagnosis not present

## 2021-08-25 DIAGNOSIS — M1990 Unspecified osteoarthritis, unspecified site: Secondary | ICD-10-CM | POA: Diagnosis not present

## 2021-08-25 DIAGNOSIS — G5793 Unspecified mononeuropathy of bilateral lower limbs: Secondary | ICD-10-CM | POA: Diagnosis not present

## 2021-08-25 DIAGNOSIS — E785 Hyperlipidemia, unspecified: Secondary | ICD-10-CM | POA: Diagnosis not present

## 2021-08-25 DIAGNOSIS — F32A Depression, unspecified: Secondary | ICD-10-CM | POA: Diagnosis not present

## 2021-08-25 DIAGNOSIS — I10 Essential (primary) hypertension: Secondary | ICD-10-CM | POA: Diagnosis not present

## 2021-08-25 DIAGNOSIS — K5900 Constipation, unspecified: Secondary | ICD-10-CM | POA: Diagnosis not present

## 2021-08-25 DIAGNOSIS — G4733 Obstructive sleep apnea (adult) (pediatric): Secondary | ICD-10-CM | POA: Diagnosis not present

## 2021-08-25 DIAGNOSIS — M797 Fibromyalgia: Secondary | ICD-10-CM | POA: Diagnosis not present

## 2021-08-25 DIAGNOSIS — R296 Repeated falls: Secondary | ICD-10-CM | POA: Diagnosis not present

## 2021-08-25 DIAGNOSIS — G4700 Insomnia, unspecified: Secondary | ICD-10-CM | POA: Diagnosis not present

## 2021-12-14 ENCOUNTER — Ambulatory Visit: Payer: Medicare Other | Admitting: Neurology

## 2023-03-29 IMAGING — CT CT HEAD W/O CM
4 series · 17 of 47 positions shown, 19 images · non-contrast
Comparison: MRI brain 12/25/2020

CLINICAL DATA: Altered mental status



[Series 2: head wo · axial · 0.40mm/px · z∈[+1114,+1234]mm · 7 of 32 slices shown, 9 images]
[im 4/32  brain]
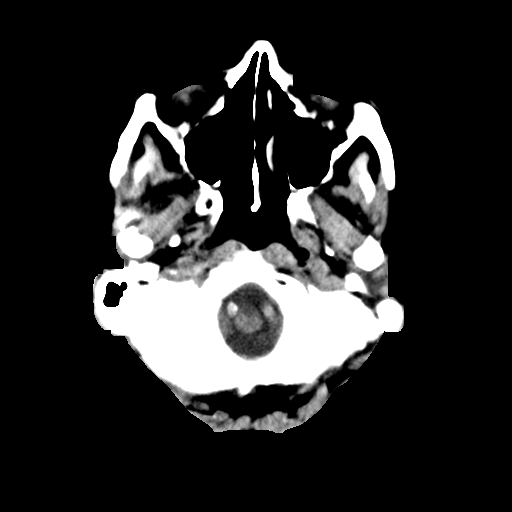
[im 4/32  bone]
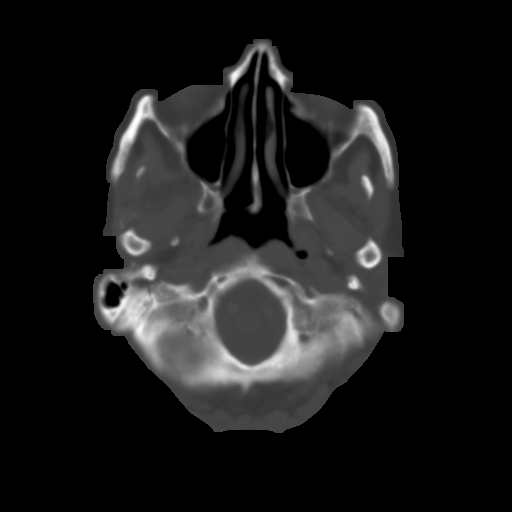
[im 8/32  brain]
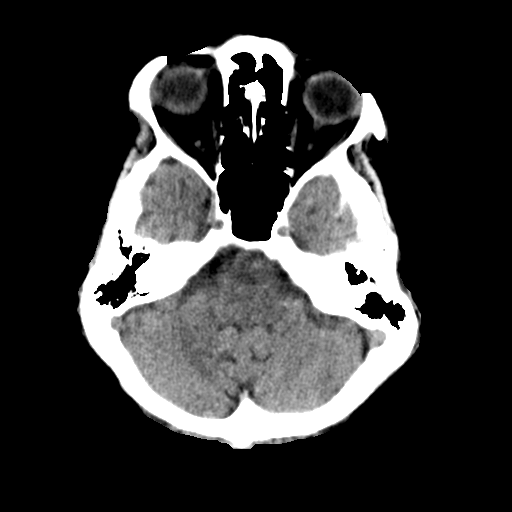
[im 12/32  brain]
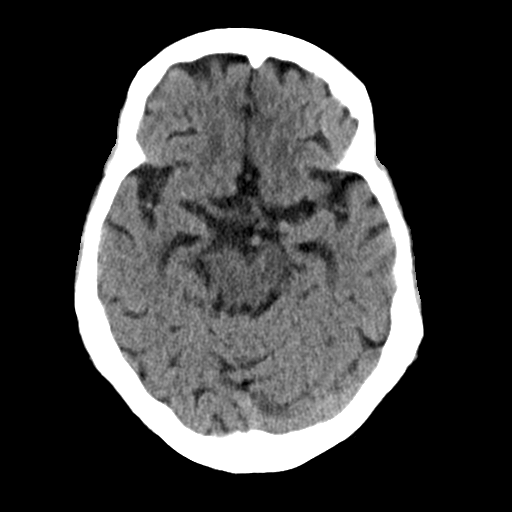
[im 16/32  brain]
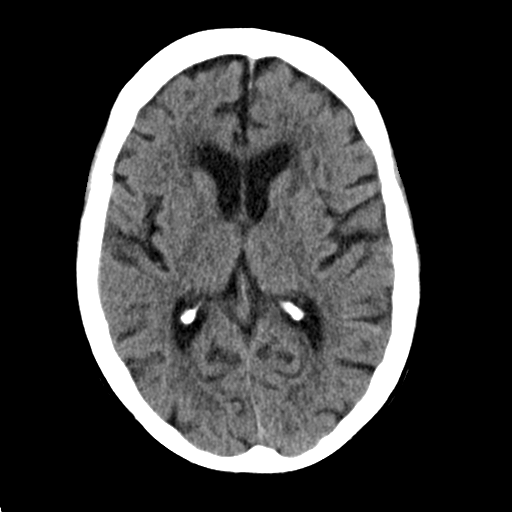
[im 20/32  brain]
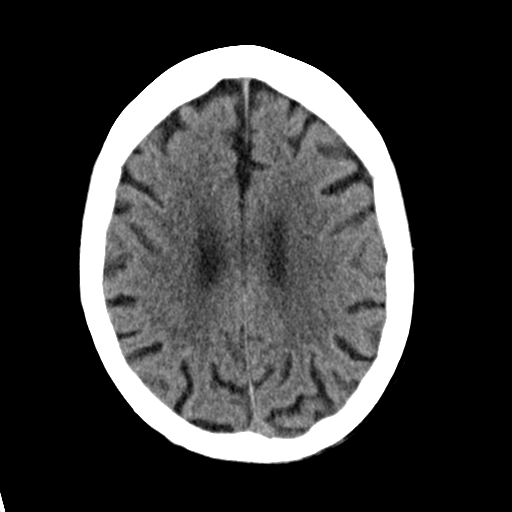
[im 20/32  bone]
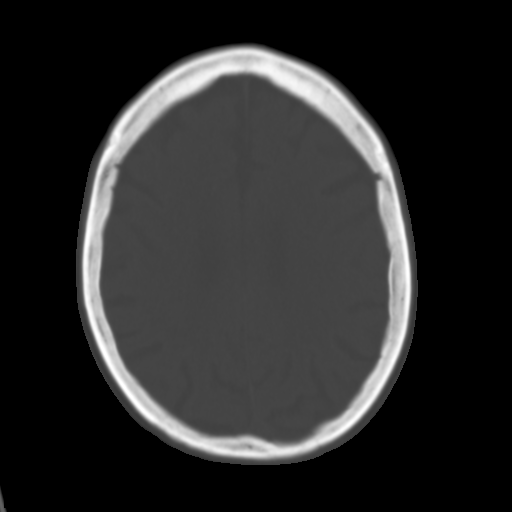
[im 24/32  brain]
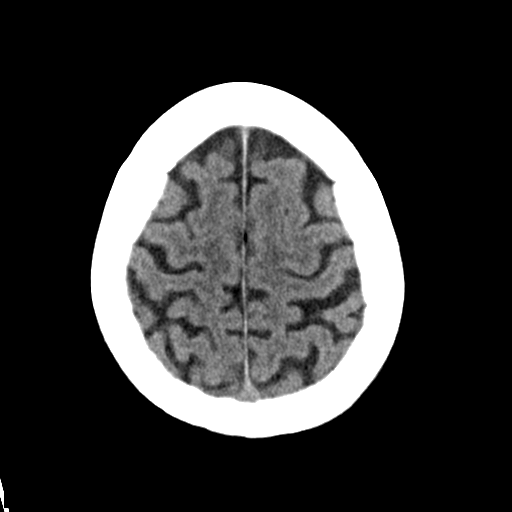
[im 28/32  brain]
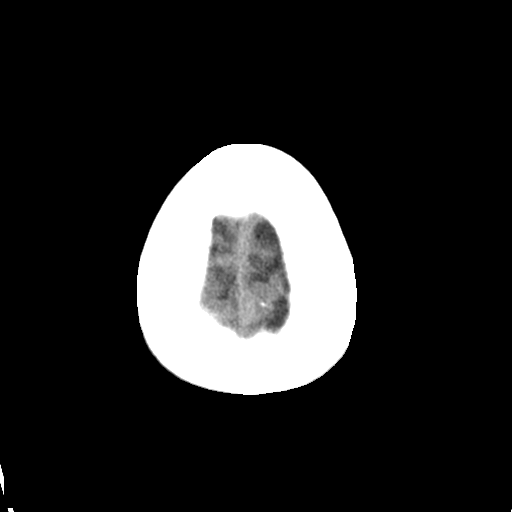

[Series 3: head bone · axial · 0.40mm/px · z∈[+1113,+1169]mm · 4 of 80 slices shown]
[im 8/80  bone]
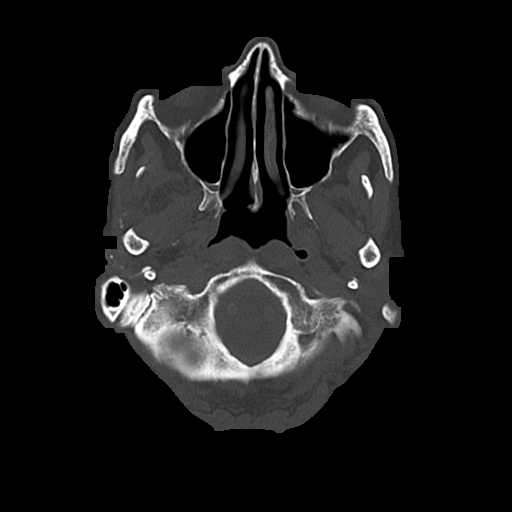
[im 16/80  bone]
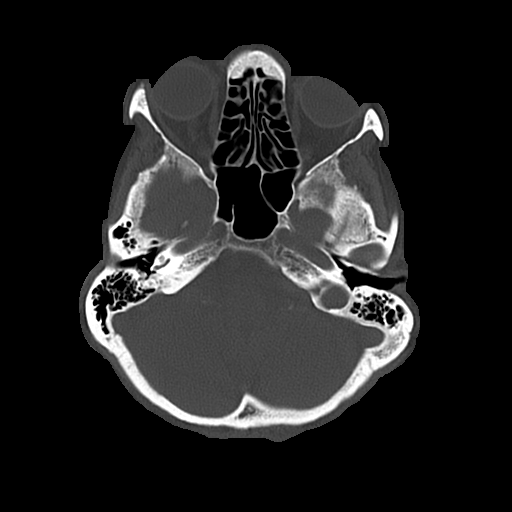
[im 24/80  bone]
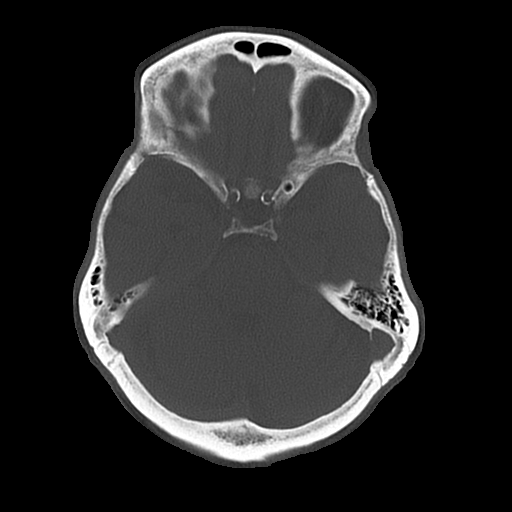
[im 36/80  bone]
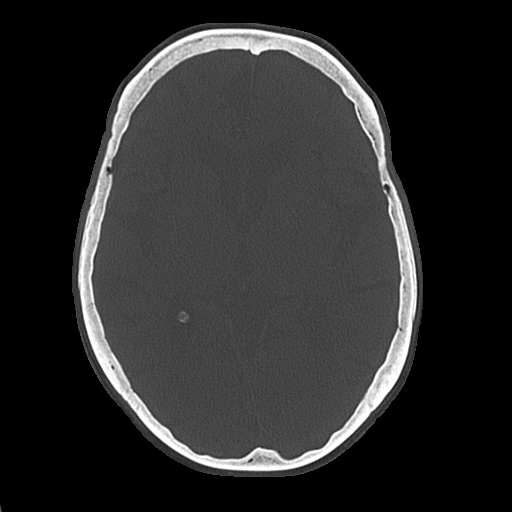

[Series 4: coronal soft · coronal · 0.32mm/px · 3 of 63 slices shown]
[im 21/63  brain]
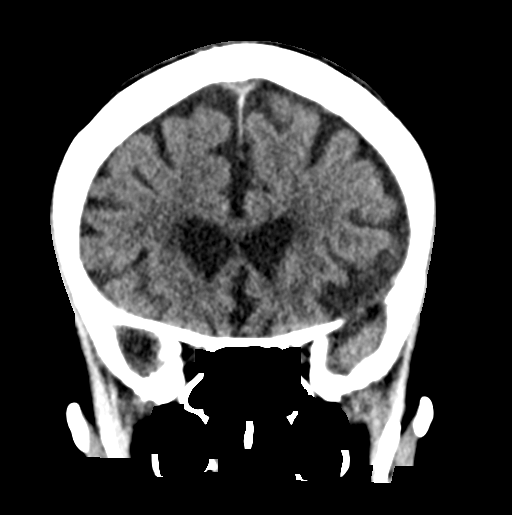
[im 28/63  brain]
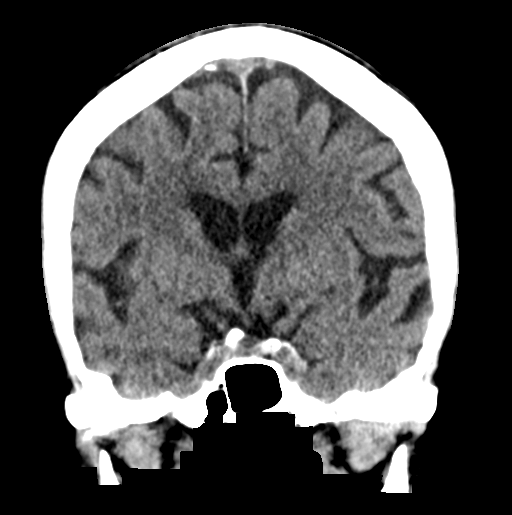
[im 35/63  brain]
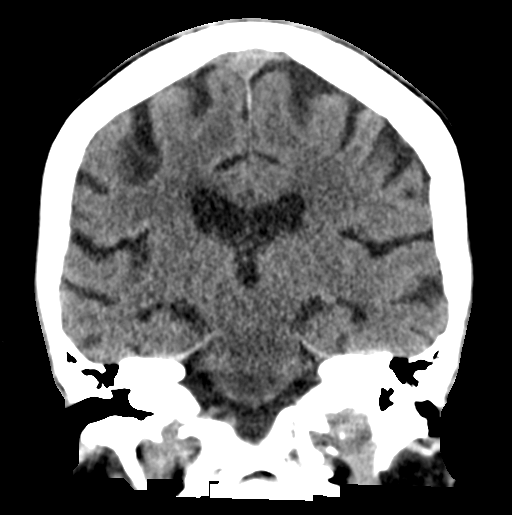

[Series 5: sagittal soft · sagittal · 0.32mm/px · 3 of 55 slices shown]
[im 19/55  brain]
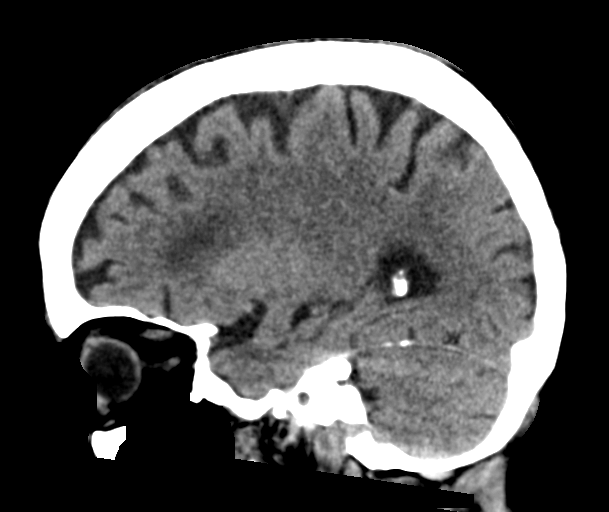
[im 28/55  brain]
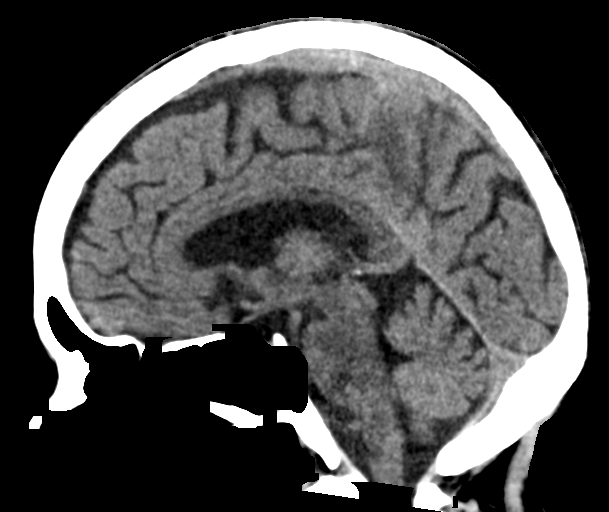
[im 37/55  brain]
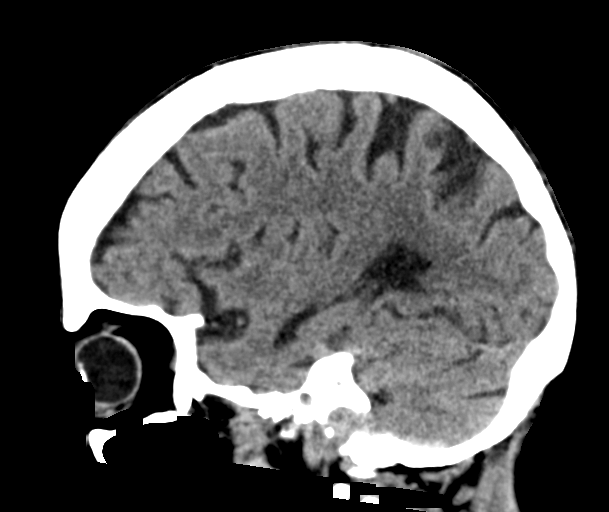

[17 of 47 positions shown; findings below may reference images not displayed]

FINDINGS: Brain: Hypodensity in the pons and periventricular white matter
compatible with chronic ischemic microvascular white matter disease.
Otherwise, the brainstem, cerebellum, cerebral peduncles, thalamus,
basal ganglia, basilar cisterns, and ventricular system appear
within normal limits. No intracranial hemorrhage, mass lesion, or
acute CVA.

Vascular: There is atherosclerotic calcification of the cavernous
carotid arteries bilaterally.

Skull: Unremarkable

Sinuses/Orbits: Mild chronic right ethmoid sinusitis.

Other: No supplemental non-categorized findings.
IMPRESSION: 1. No acute intracranial findings.
2. Periventricular white matter and corona radiata hypodensities
favor chronic ischemic microvascular white matter disease.
3. Atherosclerosis.
4. Mild chronic right ethmoid sinusitis.
